# Patient Record
Sex: Female | Born: 1985
Health system: Southern US, Community
[De-identification: ages and names within clinical notes are randomized; demographics above are authoritative.]

## PROBLEM LIST (undated history)

## (undated) DIAGNOSIS — M542 Cervicalgia: Secondary | ICD-10-CM

## (undated) DIAGNOSIS — F419 Anxiety disorder, unspecified: Secondary | ICD-10-CM

## (undated) HISTORY — PX: SHOULDER FUSION SURGERY: SHX775

## (undated) HISTORY — DX: Cervicalgia: M54.2

---
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
## 2006-11-27 ENCOUNTER — Ambulatory Visit: Payer: Self-pay | Admitting: Gynecology

## 2006-11-27 ENCOUNTER — Inpatient Hospital Stay (HOSPITAL_COMMUNITY): Admission: AD | Admit: 2006-11-27 | Discharge: 2006-11-28 | Payer: Self-pay | Admitting: Gynecology

## 2009-01-15 ENCOUNTER — Emergency Department (HOSPITAL_COMMUNITY): Admission: EM | Admit: 2009-01-15 | Discharge: 2009-01-15 | Payer: Self-pay | Admitting: Emergency Medicine

## 2009-10-23 ENCOUNTER — Ambulatory Visit (HOSPITAL_COMMUNITY): Admission: RE | Admit: 2009-10-23 | Discharge: 2009-10-23 | Payer: Self-pay | Admitting: Obstetrics

## 2009-12-01 ENCOUNTER — Inpatient Hospital Stay (HOSPITAL_COMMUNITY): Admission: AD | Admit: 2009-12-01 | Discharge: 2009-12-01 | Payer: Self-pay | Admitting: Obstetrics

## 2010-02-21 ENCOUNTER — Ambulatory Visit: Payer: Self-pay | Admitting: Advanced Practice Midwife

## 2010-02-21 ENCOUNTER — Inpatient Hospital Stay (HOSPITAL_COMMUNITY): Admission: AD | Admit: 2010-02-21 | Discharge: 2010-02-21 | Payer: Self-pay | Admitting: Obstetrics

## 2010-03-04 ENCOUNTER — Inpatient Hospital Stay (HOSPITAL_COMMUNITY): Admission: AD | Admit: 2010-03-04 | Discharge: 2010-03-06 | Payer: Self-pay | Admitting: Obstetrics

## 2010-09-13 NOTE — L&D Delivery Note (Signed)
Delivery Note Pt received epidural and progressed rapidly to complete dilation. At 6:11 AM a healthy unspecified sex was delivered via Vaginal, Spontaneous Delivery (Presentation: ;  ).  APGAR: 8,9; weight pending  Placenta status: delivered spontaneously. Cord:  with the following complications: :  none Anesthesia: Epidural  Episiotomy: None Lacerations: None Suture Repair: n/a Est. Blood Loss (mL): 350  Mom to postpartum.  Baby to nursery-stable.  Lindsay Nicholson 09/12/2011, 6:23 AM

## 2010-11-29 LAB — CBC
HCT: 27.8 % — ABNORMAL LOW (ref 36.0–46.0)
HCT: 33.9 % — ABNORMAL LOW (ref 36.0–46.0)
Hemoglobin: 11.6 g/dL — ABNORMAL LOW (ref 12.0–15.0)
Hemoglobin: 9.8 g/dL — ABNORMAL LOW (ref 12.0–15.0)
MCH: 34.5 pg — ABNORMAL HIGH (ref 26.0–34.0)
MCHC: 34.2 g/dL (ref 30.0–36.0)
MCHC: 35.1 g/dL (ref 30.0–36.0)
MCV: 97.6 fL (ref 78.0–100.0)
MCV: 98.3 fL (ref 78.0–100.0)
Platelets: 156 10*3/uL (ref 150–400)
Platelets: 187 10*3/uL (ref 150–400)
RBC: 2.83 MIL/uL — ABNORMAL LOW (ref 3.87–5.11)
RBC: 3.47 MIL/uL — ABNORMAL LOW (ref 3.87–5.11)
RDW: 13.7 % (ref 11.5–15.5)
RDW: 13.9 % (ref 11.5–15.5)
WBC: 11.1 10*3/uL — ABNORMAL HIGH (ref 4.0–10.5)
WBC: 13.6 10*3/uL — ABNORMAL HIGH (ref 4.0–10.5)

## 2010-11-29 LAB — RPR: RPR Ser Ql: NONREACTIVE

## 2010-11-30 LAB — WET PREP, GENITAL
Trich, Wet Prep: NONE SEEN
Yeast Wet Prep HPF POC: NONE SEEN

## 2010-12-06 LAB — URINALYSIS, ROUTINE W REFLEX MICROSCOPIC
Bilirubin Urine: NEGATIVE
Ketones, ur: NEGATIVE mg/dL
Nitrite: NEGATIVE
pH: 6.5 (ref 5.0–8.0)

## 2011-04-11 ENCOUNTER — Inpatient Hospital Stay (HOSPITAL_COMMUNITY)
Admission: AD | Admit: 2011-04-11 | Discharge: 2011-04-11 | Disposition: A | Discharge status: Home / Self Care | Payer: Self-pay | Source: Ambulatory Visit | Attending: Obstetrics & Gynecology | Admitting: Obstetrics & Gynecology

## 2011-04-11 ENCOUNTER — Encounter (HOSPITAL_COMMUNITY): Payer: Self-pay | Admitting: *Deleted

## 2011-04-11 DIAGNOSIS — R109 Unspecified abdominal pain: Secondary | ICD-10-CM | POA: Insufficient documentation

## 2011-04-11 DIAGNOSIS — K219 Gastro-esophageal reflux disease without esophagitis: Secondary | ICD-10-CM

## 2011-04-11 DIAGNOSIS — Z3490 Encounter for supervision of normal pregnancy, unspecified, unspecified trimester: Secondary | ICD-10-CM

## 2011-04-11 DIAGNOSIS — O9989 Other specified diseases and conditions complicating pregnancy, childbirth and the puerperium: Secondary | ICD-10-CM | POA: Insufficient documentation

## 2011-04-11 DIAGNOSIS — R198 Other specified symptoms and signs involving the digestive system and abdomen: Secondary | ICD-10-CM | POA: Insufficient documentation

## 2011-04-11 DIAGNOSIS — O99330 Smoking (tobacco) complicating pregnancy, unspecified trimester: Secondary | ICD-10-CM | POA: Insufficient documentation

## 2011-04-11 HISTORY — DX: Anxiety disorder, unspecified: F41.9

## 2011-04-11 LAB — URINALYSIS, ROUTINE W REFLEX MICROSCOPIC
Bilirubin Urine: NEGATIVE
Hgb urine dipstick: NEGATIVE
Specific Gravity, Urine: >1.03 — ABNORMAL HIGH (ref 1.005–1.030)
Urobilinogen, UA: 0.2 mg/dL (ref 0.0–1.0)
pH: 6 (ref 5.0–8.0)

## 2011-04-11 LAB — WET PREP, GENITAL
Trich, Wet Prep: NONE SEEN
Yeast Wet Prep HPF POC: NONE SEEN

## 2011-04-11 MED ORDER — GI COCKTAIL ~~LOC~~
30.0000 mL | Freq: Once | ORAL | Status: AC
  Administered 2011-04-11: 30 mL via ORAL
  Filled 2011-04-11: qty 30

## 2011-04-11 MED ORDER — RANITIDINE HCL 150 MG PO TABS: 150.0000 mg | ORAL_TABLET | Freq: Two times a day (BID) | ORAL | Status: DC

## 2011-04-11 NOTE — ED Provider Notes (Signed)
History     Chief Complaint  Patient presents with  . Abdominal Pain  . Diarrhea   Patient is a 25 y.o. female presenting with abdominal pain and diarrhea. The history is provided by the patient.  Abdominal Pain The primary symptoms of the illness include abdominal pain and diarrhea. The current episode started more than 2 days ago.  Diarrhea The primary symptoms include abdominal pain and diarrhea.  Abdominal Pain The current episode started more than 2 days ago. Associated symptoms include diarrhea.  Diarrhea  Associated symptoms include abdominal pain.  States pain is intermittent. Some is epigastric, some are sharp pains on her sides bilaterally.  THey only last a few seconds. No bleeding. No PNC yet.     Past Medical History  Diagnosis Date  . Anxiety   . Anemia     Past Surgical History  Procedure Date  . Shoulder fusion surgery     Family History  Problem Relation Age of Onset  . Diabetes Father   . Hypertension Father     History  Substance Use Topics  . Smoking status: Current Everyday Smoker -- 0.2 packs/day  . Smokeless tobacco: Not on file  . Alcohol Use: Yes     none in the past week.    Allergies: No Known Allergies  Prescriptions prior to admission  Medication Sig Dispense Refill  . acetaminophen (TYLENOL) 325 MG tablet Take 1,000 mg by mouth daily as needed. For headache       . Ibuprofen (ADVIL) 200 MG CAPS Take 3 capsules by mouth daily as needed. For headache         Review of Systems  Gastrointestinal: Positive for abdominal pain and diarrhea.   Physical Exam   Blood pressure 102/63, pulse 84, temperature 98.7 F (37.1 C), temperature source Oral, resp. rate 16, height 5\' 1"  (1.549 m), weight 96 lb 6.4 oz (43.727 kg), last menstrual period 01/10/2011.  Physical Exam  Constitutional: She is oriented to person, place, and time. She appears well-developed and well-nourished.  HENT:  Head: Normocephalic.  Cardiovascular: Normal rate.     Respiratory: Effort normal.  GI: Soft. She exhibits no distension. There is no tenderness. There is no rebound and no guarding.  Genitourinary: Vagina normal and uterus normal. No vaginal discharge found.  Neurological: She is alert and oriented to person, place, and time.  Skin: Skin is warm and dry.    MAU Course  Procedures Results for orders placed during the hospital encounter of 04/11/11 (from the past 24 hour(s))  URINALYSIS, ROUTINE W REFLEX MICROSCOPIC     Status: Abnormal   Collection Time   04/11/11  5:50 PM      Component Value Range   Color, Urine YELLOW  YELLOW    Appearance CLEAR  CLEAR    Specific Gravity, Urine >1.030 (*) 1.005 - 1.030    pH 6.0  5.0 - 8.0    Glucose, UA NEGATIVE  NEGATIVE (mg/dL)   Hgb urine dipstick NEGATIVE  NEGATIVE    Bilirubin Urine NEGATIVE  NEGATIVE    Ketones, ur 15 (*) NEGATIVE (mg/dL)   Protein, ur NEGATIVE  NEGATIVE (mg/dL)   Urobilinogen, UA 0.2  0.0 - 1.0 (mg/dL)   Nitrite NEGATIVE  NEGATIVE    Leukocytes, UA NEGATIVE  NEGATIVE   POCT PREGNANCY, URINE     Status: Normal   Collection Time   04/11/11  5:56 PM      Component Value Range   Preg Test, Ur POSITIVE  WET PREP, GENITAL     Status: Abnormal   Collection Time   04/11/11  8:05 PM      Component Value Range   Yeast, Wet Prep NONE SEEN  NONE SEEN    Trich, Wet Prep NONE SEEN  NONE SEEN    Clue Cells, Wet Prep NONE SEEN  NONE SEEN    WBC, Wet Prep HPF POC FEW (*) NONE SEEN     Assessment and Plan  A:  IUP at 13 weeks by LMP Epigastric pain, r/o GERD Side pains, probably ligamentous  P:  WIll try one dose of GI cocktail  [facility] 04/11/2011, 7:55 PM   Pt reports significant improvement after GI cocktail.  D/C home with rx for ranitidine F/U as soon as possible for prenatal care

## 2011-04-11 NOTE — Progress Notes (Signed)
Pt c/o intermittant  upper abd pain and pain along both sides.

## 2011-04-11 NOTE — Progress Notes (Signed)
Pt reports having diarrehea on and off since Friday night. Having abd pain and cramping as well

## 2011-04-12 LAB — GC/CHLAMYDIA PROBE AMP, GENITAL: GC Probe Amp, Genital: NEGATIVE

## 2011-07-07 LAB — ANTIBODY SCREEN: Antibody Screen: NEGATIVE

## 2011-07-07 LAB — HIV ANTIBODY (ROUTINE TESTING W REFLEX): HIV: NONREACTIVE

## 2011-07-07 LAB — ABO/RH: RH Type: POSITIVE

## 2011-07-07 LAB — HEPATITIS B SURFACE ANTIGEN: Hepatitis B Surface Ag: NEGATIVE

## 2011-08-16 ENCOUNTER — Encounter (HOSPITAL_COMMUNITY): Payer: Self-pay | Admitting: *Deleted

## 2011-08-16 ENCOUNTER — Inpatient Hospital Stay (HOSPITAL_COMMUNITY)
Admission: AD | Admit: 2011-08-16 | Discharge: 2011-08-16 | Disposition: A | Discharge status: Home / Self Care | Payer: Medicaid Other | Source: Ambulatory Visit | Attending: Obstetrics and Gynecology | Admitting: Obstetrics and Gynecology

## 2011-08-16 DIAGNOSIS — Z3480 Encounter for supervision of other normal pregnancy, unspecified trimester: Secondary | ICD-10-CM

## 2011-08-16 DIAGNOSIS — O99891 Other specified diseases and conditions complicating pregnancy: Secondary | ICD-10-CM | POA: Insufficient documentation

## 2011-08-16 DIAGNOSIS — Z3490 Encounter for supervision of normal pregnancy, unspecified, unspecified trimester: Secondary | ICD-10-CM

## 2011-08-16 LAB — URINALYSIS, MICROSCOPIC ONLY
Bilirubin Urine: NEGATIVE
Hgb urine dipstick: NEGATIVE
Ketones, ur: NEGATIVE mg/dL
Protein, ur: NEGATIVE mg/dL
Urobilinogen, UA: 0.2 mg/dL (ref 0.0–1.0)

## 2011-08-16 NOTE — Progress Notes (Signed)
Pt G3 P2 at 31.1wks, leaking clear fluid since 0600.  Denies pain or bleeding.

## 2011-08-16 NOTE — ED Notes (Signed)
Bernita Buffy CNM notified of pt.  Will be in to see pt.

## 2011-08-16 NOTE — ED Provider Notes (Signed)
Lindsay Sam25 y.o.G3P2002 @[redacted]w[redacted]d  by [place] at [redacted]w[redacted]d Chief Complaint  Patient presents with  . Rupture of Membranes    SUBJECTIVE  HPI Had a gush of fluid at 0600 today which was enough to wet the bed. She has not continued to leak. She is aware of mild contractions. No vaginal bleeding. Good fetal movement. She denies having urinary incontinence and does not think it was urine. Last intercourse was about 2 weeks ago. Denies irritative vaginal discharge.  Past Medical History  Diagnosis Date  . Anxiety   . Anemia    Past Surgical History  Procedure Date  . Shoulder fusion surgery    History   Social History  . Marital Status: Single    Spouse Name: N/A    Number of Children: N/A  . Years of Education: N/A   Occupational History  . Not on file.   Social History Main Topics  . Smoking status: Current Everyday Smoker -- 0.2 packs/day  . Smokeless tobacco: Never Used  . Alcohol Use: No     none in the past week.  . Drug Use: No  . Sexually Active: Yes   Other Topics Concern  . Not on file   Social History Narrative  . No narrative on file   No current facility-administered medications on file prior to encounter.   Current Outpatient Prescriptions on File Prior to Encounter  Medication Sig Dispense Refill  . acetaminophen (TYLENOL) 325 MG tablet Take 650 mg by mouth daily as needed. For headache       No Known Allergies  ROS: Pertinent items in HPI  OBJECTIVE  BP 104/69  Pulse 75  Temp(Src) 97.2 F (36.2 C) (Oral)  Resp 18  Ht 5\' 2"  (1.575 m)  Wt 51.891 kg (114 lb 6.4 oz)  BMI 20.92 kg/m2  LMP 01/10/2011   Physical Exam  Constitutional: She is well-developed, well-nourished, and in no distress.  HENT:  Head: Normocephalic.  Neck: Neck supple.  Cardiovascular: Normal rate.   Pulmonary/Chest: Effort normal.  Abdominal: Soft. There is no tenderness.  Genitourinary: Vagina normal and cervix normal.       SSE: neg pool, mod thin white discharge, fern  neg Cx visually closed   Toco: UCs q 10-12 min x 30-40 sec FHR: 140, reactive without decelerations  MAU Course: care turned over to [facility] CNM. Urinalysis and any sure are pending.  Results for orders placed during the hospital encounter of 08/16/11 (from the past 24 hour(s))  AMNISURE RUPTURE OF MEMBRANE (ROM)     Status: Normal   Collection Time   08/16/11  7:54 AM      Component Value Range   Amnisure ROM NEGATIVE    URINALYSIS, MICROSCOPIC ONLY     Status: Abnormal   Collection Time   08/16/11  7:55 AM      Component Value Range   Color, Urine YELLOW  YELLOW    APPearance HAZY (*) CLEAR    Specific Gravity, Urine 1.025  1.005 - 1.030    pH 6.5  5.0 - 8.0    Glucose, UA NEGATIVE  NEGATIVE (mg/dL)   Hgb urine dipstick NEGATIVE  NEGATIVE    Bilirubin Urine NEGATIVE  NEGATIVE    Ketones, ur NEGATIVE  NEGATIVE (mg/dL)   Protein, ur NEGATIVE  NEGATIVE (mg/dL)   Urobilinogen, UA 0.2  0.0 - 1.0 (mg/dL)   Nitrite NEGATIVE  NEGATIVE    Leukocytes, UA SMALL (*) NEGATIVE    WBC, UA 7-10  <3 (WBC/hpf)  RBC / HPF 0-2  <3 (RBC/hpf)   Squamous Epithelial / LPF MANY (*) RARE      ASSESSMENT 25 y.o. [id number] at [redacted]w[redacted]d Membranes intact, no signs of labor  PLAN Consulted with Dr. Ambrose Mantle, ok to d/c home with precuations, f/u as scheduled

## 2011-08-31 LAB — STREP B DNA PROBE: GBS: NEGATIVE

## 2011-09-12 ENCOUNTER — Inpatient Hospital Stay (HOSPITAL_COMMUNITY): Payer: Medicaid Other | Admitting: Anesthesiology

## 2011-09-12 ENCOUNTER — Encounter (HOSPITAL_COMMUNITY): Payer: Self-pay | Admitting: Anesthesiology

## 2011-09-12 ENCOUNTER — Encounter (HOSPITAL_COMMUNITY): Payer: Self-pay | Admitting: *Deleted

## 2011-09-12 ENCOUNTER — Inpatient Hospital Stay (HOSPITAL_COMMUNITY)
Admission: AD | Admit: 2011-09-12 | Discharge: 2011-09-14 | DRG: 775 | Disposition: A | Discharge status: Home / Self Care | Payer: Medicaid Other | Source: Ambulatory Visit | Attending: Obstetrics and Gynecology | Admitting: Obstetrics and Gynecology

## 2011-09-12 LAB — CBC
MCV: 95.1 fL (ref 78.0–100.0)
Platelets: 196 10*3/uL (ref 150–400)
RBC: 3.85 MIL/uL — ABNORMAL LOW (ref 3.87–5.11)
WBC: 8.3 10*3/uL (ref 4.0–10.5)

## 2011-09-12 LAB — RPR: RPR Ser Ql: NONREACTIVE

## 2011-09-12 MED ORDER — ACETAMINOPHEN 325 MG PO TABS: 650.0000 mg | ORAL_TABLET | ORAL | Status: DC | PRN

## 2011-09-12 MED ORDER — LACTATED RINGERS IV SOLN: INTRAVENOUS | Status: DC

## 2011-09-12 MED ORDER — EPHEDRINE 5 MG/ML INJ: 10.0000 mg | INTRAVENOUS | Status: DC | PRN

## 2011-09-12 MED ORDER — OXYTOCIN 20 UNITS IN LACTATED RINGERS INFUSION - SIMPLE: 125.0000 mL/h | Freq: Once | INTRAVENOUS | Status: DC

## 2011-09-12 MED ORDER — TETANUS-DIPHTH-ACELL PERTUSSIS 5-2.5-18.5 LF-MCG/0.5 IM SUSP
0.5000 mL | Freq: Once | INTRAMUSCULAR | Status: AC
  Administered 2011-09-13: 0.5 mL via INTRAMUSCULAR
  Filled 2011-09-12: qty 0.5

## 2011-09-12 MED ORDER — DIPHENHYDRAMINE HCL 25 MG PO CAPS: 25.0000 mg | ORAL_CAPSULE | Freq: Four times a day (QID) | ORAL | Status: DC | PRN

## 2011-09-12 MED ORDER — DIPHENHYDRAMINE HCL 50 MG/ML IJ SOLN: 12.5000 mg | INTRAMUSCULAR | Status: DC | PRN

## 2011-09-12 MED ORDER — ONDANSETRON HCL 4 MG PO TABS: 4.0000 mg | ORAL_TABLET | ORAL | Status: DC | PRN

## 2011-09-12 MED ORDER — OXYCODONE-ACETAMINOPHEN 5-325 MG PO TABS: 2.0000 | ORAL_TABLET | ORAL | Status: DC | PRN

## 2011-09-12 MED ORDER — ONDANSETRON HCL 4 MG/2ML IJ SOLN: 4.0000 mg | INTRAMUSCULAR | Status: DC | PRN

## 2011-09-12 MED ORDER — LACTATED RINGERS IV SOLN: 500.0000 mL | INTRAVENOUS | Status: DC | PRN

## 2011-09-12 MED ORDER — WITCH HAZEL-GLYCERIN EX PADS: 1.0000 "application " | MEDICATED_PAD | CUTANEOUS | Status: DC | PRN

## 2011-09-12 MED ORDER — ZOLPIDEM TARTRATE 5 MG PO TABS: 5.0000 mg | ORAL_TABLET | Freq: Every evening | ORAL | Status: DC | PRN

## 2011-09-12 MED ORDER — ONDANSETRON HCL 4 MG/2ML IJ SOLN: 4.0000 mg | Freq: Four times a day (QID) | INTRAMUSCULAR | Status: DC | PRN

## 2011-09-12 MED ORDER — LANOLIN HYDROUS EX OINT: TOPICAL_OINTMENT | CUTANEOUS | Status: DC | PRN

## 2011-09-12 MED ORDER — PHENYLEPHRINE 40 MCG/ML (10ML) SYRINGE FOR IV PUSH (FOR BLOOD PRESSURE SUPPORT)
80.0000 ug | PREFILLED_SYRINGE | INTRAVENOUS | Status: DC | PRN
  Filled 2011-09-12: qty 5

## 2011-09-12 MED ORDER — CITRIC ACID-SODIUM CITRATE 334-500 MG/5ML PO SOLN: 30.0000 mL | ORAL | Status: DC | PRN

## 2011-09-12 MED ORDER — LIDOCAINE HCL (PF) 1 % IJ SOLN
30.0000 mL | INTRAMUSCULAR | Status: DC | PRN
  Filled 2011-09-12: qty 30

## 2011-09-12 MED ORDER — SODIUM BICARBONATE 8.4 % IV SOLN
INTRAVENOUS | Status: DC | PRN
  Administered 2011-09-12: 4 mL via EPIDURAL

## 2011-09-12 MED ORDER — BENZOCAINE-MENTHOL 20-0.5 % EX AERO: 1.0000 "application " | INHALATION_SPRAY | CUTANEOUS | Status: DC | PRN

## 2011-09-12 MED ORDER — SENNOSIDES-DOCUSATE SODIUM 8.6-50 MG PO TABS
2.0000 | ORAL_TABLET | Freq: Every day | ORAL | Status: DC
  Administered 2011-09-12 – 2011-09-13 (×2): 2 via ORAL

## 2011-09-12 MED ORDER — EPHEDRINE 5 MG/ML INJ
10.0000 mg | INTRAVENOUS | Status: DC | PRN
  Filled 2011-09-12: qty 4

## 2011-09-12 MED ORDER — OXYCODONE-ACETAMINOPHEN 5-325 MG PO TABS
1.0000 | ORAL_TABLET | ORAL | Status: DC | PRN
  Administered 2011-09-12: 0.5 via ORAL
  Administered 2011-09-13 (×2): 1 via ORAL
  Filled 2011-09-12 (×3): qty 1

## 2011-09-12 MED ORDER — BUTORPHANOL TARTRATE 2 MG/ML IJ SOLN: 1.0000 mg | INTRAMUSCULAR | Status: DC | PRN

## 2011-09-12 MED ORDER — IBUPROFEN 600 MG PO TABS: 600.0000 mg | ORAL_TABLET | Freq: Four times a day (QID) | ORAL | Status: DC | PRN

## 2011-09-12 MED ORDER — IBUPROFEN 600 MG PO TABS
600.0000 mg | ORAL_TABLET | Freq: Four times a day (QID) | ORAL | Status: DC
  Administered 2011-09-12 – 2011-09-14 (×9): 600 mg via ORAL
  Filled 2011-09-12 (×5): qty 1
  Filled 2011-09-12: qty 2
  Filled 2011-09-12 (×2): qty 1

## 2011-09-12 MED ORDER — PHENYLEPHRINE 40 MCG/ML (10ML) SYRINGE FOR IV PUSH (FOR BLOOD PRESSURE SUPPORT): 80.0000 ug | PREFILLED_SYRINGE | INTRAVENOUS | Status: DC | PRN

## 2011-09-12 MED ORDER — FENTANYL 2.5 MCG/ML BUPIVACAINE 1/10 % EPIDURAL INFUSION (WH - ANES)
INTRAMUSCULAR | Status: DC | PRN
  Administered 2011-09-12: 12 mL/h via EPIDURAL

## 2011-09-12 MED ORDER — SIMETHICONE 80 MG PO CHEW: 80.0000 mg | CHEWABLE_TABLET | ORAL | Status: DC | PRN

## 2011-09-12 MED ORDER — DIBUCAINE 1 % RE OINT: 1.0000 "application " | TOPICAL_OINTMENT | RECTAL | Status: DC | PRN

## 2011-09-12 MED ORDER — PRENATAL MULTIVITAMIN CH
1.0000 | ORAL_TABLET | Freq: Every day | ORAL | Status: DC
  Administered 2011-09-12 – 2011-09-14 (×3): 1 via ORAL
  Filled 2011-09-12 (×3): qty 1

## 2011-09-12 MED ORDER — OXYTOCIN BOLUS FROM INFUSION
500.0000 mL | Freq: Once | INTRAVENOUS | Status: DC
  Filled 2011-09-12: qty 1000
  Filled 2011-09-12: qty 500

## 2011-09-12 MED ORDER — FLEET ENEMA 7-19 GM/118ML RE ENEM: 1.0000 | ENEMA | RECTAL | Status: DC | PRN

## 2011-09-12 MED ORDER — FENTANYL 2.5 MCG/ML BUPIVACAINE 1/10 % EPIDURAL INFUSION (WH - ANES)
14.0000 mL/h | INTRAMUSCULAR | Status: DC
  Filled 2011-09-12: qty 60

## 2011-09-12 MED ORDER — LACTATED RINGERS IV SOLN
500.0000 mL | Freq: Once | INTRAVENOUS | Status: AC
  Administered 2011-09-12: 500 mL via INTRAVENOUS

## 2011-09-12 NOTE — Progress Notes (Signed)
G3P2 at 37.0wks. Ctxs since 0300. Denies bleeding or leaking fld

## 2011-09-12 NOTE — H&P (Signed)
Lindsay Nicholson is a 25 y.o. female G3P2002 at 37+weeks (EDD 10/03/11 by 23 week [place]) presenting for active labor with cervical change to 8cm.  Admitted and now receiving epidural. Prenatal care uneventful except late start at 23 weeks.  Maternal Medical History:  Reason for admission: Reason for admission: contractions.  Contractions: Onset was 3-5 hours ago.   Frequency: regular.   Perceived severity is strong.      OB History    Grav Para Term Preterm Abortions TAB SAB Ect Mult Living   3 2 2       2     2008 NSVD 6# 2011 NSVD 6#2oz  Past Medical History  Diagnosis Date  . Anxiety   . Anemia    Past Surgical History  Procedure Date  . Shoulder fusion surgery    Family History: family history includes Diabetes in her father and Hypertension in her father.  There is no history of Anesthesia problems. Social History:  reports that she has been smoking.  She has never used smokeless tobacco. She reports that she does not drink alcohol or use illicit drugs.  ROS  Dilation: 7.5 (7-8) Effacement (%): 90 Station: -1 Exam by:: Peace, rn and Rolitta, rn Blood pressure 131/103, pulse 81, temperature 97.8 F (36.6 C), temperature source Oral, resp. rate 18, height 5\' 2"  (1.575 m), weight 52.617 kg (116 lb), last menstrual period 01/10/2011. Maternal Exam:  Uterine Assessment: Contraction strength is moderate.  Contraction frequency is regular.   Abdomen: Patient reports no abdominal tenderness. Fetal presentation: vertex  Introitus: Normal vulva. Normal vagina.    Physical Exam  Constitutional: She is oriented to person, place, and time. She appears well-developed and well-nourished.  Cardiovascular: Normal rate and regular rhythm.   Respiratory: Effort normal and breath sounds normal.  GI: Soft. Bowel sounds are normal.  Neurological: She is alert and oriented to person, place, and time.  Psychiatric: She has a normal mood and affect. Her behavior is normal.    Prenatal  labs: ABO, Rh:  O positive Antibody:  negative Rubella:  Immune RPR:   NR HBsAg:   Neg HIV:   NR GBS:   Neg One hour glucola 83 Genetic screens not done b/c too late at presentation  Assessment/Plan: Pt presented in active labor, now receiving her epidural.     Huel Cote W 09/12/2011, 5:38 AM

## 2011-09-12 NOTE — Anesthesia Preprocedure Evaluation (Signed)

## 2011-09-12 NOTE — Anesthesia Postprocedure Evaluation (Signed)
  Anesthesia Post-op Note  Patient: Lindsay Nicholson  Procedure(s) Performed: * No procedures listed *  Patient Location: PACU and Mother/Baby  Anesthesia Type: Epidural  Level of Consciousness: awake, alert  and oriented  Airway and Oxygen Therapy: Patient Spontanous Breathing   Post-op Assessment: Patient's Cardiovascular Status Stable and Respiratory Function Stable  Post-op Vital Signs: stable  Complications: No apparent anesthesia complications

## 2011-09-12 NOTE — Anesthesia Procedure Notes (Signed)

## 2011-09-13 LAB — CBC
Hemoglobin: 11.2 g/dL — ABNORMAL LOW (ref 12.0–15.0)
MCH: 32.4 pg (ref 26.0–34.0)
MCHC: 33.8 g/dL (ref 30.0–36.0)
RDW: 13.6 % (ref 11.5–15.5)

## 2011-09-13 MED ORDER — OXYCODONE-ACETAMINOPHEN 5-325 MG PO TABS: 1.0000 | ORAL_TABLET | ORAL | Status: AC | PRN

## 2011-09-13 MED ORDER — IBUPROFEN 600 MG PO TABS: 600.0000 mg | ORAL_TABLET | Freq: Four times a day (QID) | ORAL | Status: AC

## 2011-09-13 NOTE — Discharge Summary (Addendum)
Obstetric Discharge Summary Reason for Admission: onset of labor Prenatal Procedures: none Intrapartum Procedures: spontaneous vaginal delivery Postpartum Procedures: none Complications-Operative and Postpartum: none Hemoglobin  Date Value Range Status  09/13/2011 11.2* 12.0-15.0 (g/dL) Final     HCT  Date Value Range Status  09/13/2011 33.1* 36.0-46.0 (%) Final    Discharge Diagnoses: Term Pregnancy-delivered  Discharge Information: Date: 09/13/2011 Activity: pelvic rest Diet: routine Medications: Ibuprofen and Percocet Condition: stable Instructions: refer to practice specific booklet Discharge to: home Follow-up Information    Follow up with RICHARDSON,KATHY W. Make an appointment in 6 weeks.   Contact information:   [address] [place] [address] [phone]          Newborn Data: Live born female  Birth Weight: 5 lb 2.4 oz (2335 g) APGAR: 9, 9  Home with mother.  Lindsay Nicholson 09/13/2011, 7:18 AM  Pt not discharged 09/13/11, due to baby being unable to be discharged,  D/c home 09/14/11

## 2011-09-13 NOTE — Progress Notes (Signed)
SW received referral for [facility] and history of Anxiety.  SW notes that prenatal record states care began at 27 weeks, which does not meet our [facility] policy guidelines of 28 weeks or later.  SW does not see Anxiety documented anywhere in patient's record.  SW called bedside RN/Ellen who also does not know where the referral for Anxiety came from and states patient has not appeared anxious at this point.  SW has screened out referral but will see patient by her request or if needs arise. 

## 2011-09-13 NOTE — Progress Notes (Signed)
Post Partum Day 1 Subjective: no complaints, voiding and tolerating PO, requests early d/c  Objective: Blood pressure 104/65, pulse 75, temperature 98.1 F (36.7 C), temperature source Oral, resp. rate 18, height 5\' 2"  (1.575 m), weight 52.617 kg (116 lb), last menstrual period 01/10/2011, SpO2 100.00%, unknown if currently breastfeeding.  Physical Exam:  General: alert Lochia: appropriate Uterine Fundus: firm    Basename 09/12/11 0510  HGB 12.5  HCT 36.6    Assessment/Plan: D/c home if baby able to go Motrin, percocet F/u 6 weeks   LOS: 1 day   Tamari Busic W 09/13/2011, 3:23 AM

## 2011-09-13 NOTE — Progress Notes (Signed)
UR chart review completed.  

## 2011-09-14 ENCOUNTER — Encounter (HOSPITAL_COMMUNITY): Payer: Self-pay | Admitting: Obstetrics and Gynecology

## 2011-09-14 NOTE — Progress Notes (Signed)
Post Partum Day 2 Subjective: no complaints, up ad lib, voiding and tolerating PO  Objective: Blood pressure 112/68, pulse 80, temperature 98.6 F (37 C), temperature source Oral, resp. rate 18, height 5\' 2"  (1.575 m), weight 52.617 kg (116 lb), last menstrual period 01/10/2011, SpO2 100.00%, unknown if currently breastfeeding.  Physical Exam:  General: alert and no distress Lochia: appropriate Uterine Fundus: firm   Basename 09/13/11 0530 09/12/11 0510  HGB 11.2* 12.5  HCT 33.1* 36.6    Assessment/Plan: Discharge home   LOS: 2 days   BOVARD,Tillmon Kisling 09/14/2011, 10:32 AM

## 2014-07-15 ENCOUNTER — Encounter (HOSPITAL_COMMUNITY): Payer: Self-pay | Admitting: Obstetrics and Gynecology

## 2014-12-03 ENCOUNTER — Ambulatory Visit (INDEPENDENT_AMBULATORY_CARE_PROVIDER_SITE_OTHER): Payer: Commercial Managed Care - PPO | Admitting: Internal Medicine

## 2014-12-03 ENCOUNTER — Encounter: Payer: Self-pay | Admitting: Internal Medicine

## 2014-12-03 VITALS — BP 102/64 | HR 93 | Temp 98.5°F | Ht 61.5 in | Wt 109.0 lb

## 2014-12-03 DIAGNOSIS — Z0000 Encounter for general adult medical examination without abnormal findings: Secondary | ICD-10-CM

## 2014-12-03 DIAGNOSIS — R51 Headache: Secondary | ICD-10-CM

## 2014-12-03 DIAGNOSIS — F411 Generalized anxiety disorder: Secondary | ICD-10-CM

## 2014-12-03 DIAGNOSIS — R519 Headache, unspecified: Secondary | ICD-10-CM | POA: Insufficient documentation

## 2014-12-03 LAB — COMPREHENSIVE METABOLIC PANEL
ALBUMIN: 4.7 g/dL (ref 3.5–5.2)
ALT: 12 U/L (ref 0–35)
AST: 17 U/L (ref 0–37)
Alkaline Phosphatase: 46 U/L (ref 39–117)
BUN: 15 mg/dL (ref 6–23)
CHLORIDE: 107 meq/L (ref 96–112)
CO2: 27 meq/L (ref 19–32)
Calcium: 9.6 mg/dL (ref 8.4–10.5)
Creatinine, Ser: 0.85 mg/dL (ref 0.40–1.20)
GFR: 83.78 mL/min (ref >60.00)
GLUCOSE: 75 mg/dL (ref 70–99)
Potassium: 3.7 mEq/L (ref 3.5–5.1)
SODIUM: 140 meq/L (ref 135–145)
Total Bilirubin: 0.4 mg/dL (ref 0.2–1.2)
Total Protein: 7.5 g/dL (ref 6.0–8.3)

## 2014-12-03 LAB — TSH: TSH: 1.02 u[IU]/mL (ref 0.35–4.50)

## 2014-12-03 LAB — LIPID PANEL
CHOL/HDL RATIO: 3
Cholesterol: 137 mg/dL (ref 0–200)
HDL: 44 mg/dL (ref >39.00)
LDL CALC: 65 mg/dL (ref 0–99)
NonHDL: 93
TRIGLYCERIDES: 140 mg/dL (ref 0.0–149.0)
VLDL: 28 mg/dL (ref 0.0–40.0)

## 2014-12-03 LAB — CBC
HEMATOCRIT: 36.3 % (ref 36.0–46.0)
Hemoglobin: 12.1 g/dL (ref 12.0–15.0)
MCHC: 33.3 g/dL (ref 30.0–36.0)
MCV: 91.4 fl (ref 78.0–100.0)
Platelets: 258 10*3/uL (ref 150.0–400.0)
RBC: 3.97 Mil/uL (ref 3.87–5.11)
RDW: 13 % (ref 11.5–15.5)
WBC: 4.6 10*3/uL (ref 4.0–10.5)

## 2014-12-03 MED ORDER — ETONOGESTREL 68 MG ~~LOC~~ IMPL: 1.0000 | DRUG_IMPLANT | Freq: Once | SUBCUTANEOUS | Status: DC

## 2014-12-03 MED ORDER — BUTALBITAL-APAP-CAFFEINE 50-325-40 MG PO TABS: 1.0000 | ORAL_TABLET | Freq: Four times a day (QID) | ORAL | Status: DC | PRN

## 2014-12-03 NOTE — Progress Notes (Signed)
Pre visit review using our clinic review tool, if applicable. No additional management support is needed unless otherwise documented below in the visit note. 

## 2014-12-03 NOTE — Assessment & Plan Note (Signed)
Situational, not occuring at this moment Will monitor for now

## 2014-12-03 NOTE — Progress Notes (Signed)
Subjective:    Patient ID: Lindsay Nicholson, female    DOB: 07/04/1986, 29 y.o.   MRN: [id number]  HPI  Pt presents to the clinic today to establish care and for management of the conditions listed below. She has not had a PCP in many years. She would like her physical exam today.  Frequent Headaches: She reports these are occuring on a weekly basis. This started 1 year ago. They usually last 2-3 days. The pain starts in the back of her head and radiates to the neck and shoulders, mostly on the left side. She describes the pain as sharp. She has had some associated nausea, photophobia, and dizziness. She has been taking Ibuprofen with minimal relief. She does not feel stressed out. She denies any injury to the head or neck.  Anxiety: This is usually situational. She has been given Valium in the past but does not like the way it made her feel. She reports she is not currently having anxiety and is not interested in treatment at this time.  Flu: 09/2013 Tetanus: 2012 LMP: 12/03/2014, has Implanon, periods irregular, occur every 2-3 months. Pap Smear: 10/2014 Dentist: as needed  Review of Systems      Past Medical History  Diagnosis Date  . Anxiety   . Anemia   . SVD (spontaneous vaginal delivery) 09/14/2011    Current Outpatient Prescriptions  Medication Sig Dispense Refill  . ibuprofen (ADVIL,MOTRIN) 200 MG tablet Take 200 mg by mouth as needed.    Marland Kitchen. levonorgestrel (MIRENA) 20 MCG/24HR IUD 1 each by Intrauterine route once. Placed 10/2014     No current facility-administered medications for this visit.    No Known Allergies  Family History  Problem Relation Age of Onset  . Diabetes Father   . Hypertension Father   . Anesthesia problems Neg Hx     History   Social History  . Marital Status: Single    Spouse Name: N/A  . Number of Children: N/A  . Years of Education: N/A   Occupational History  . Not on file.   Social History Main Topics  . Smoking status: Current Every  Day Smoker -- 0.25 packs/day  . Smokeless tobacco: Never Used  . Alcohol Use: 0.0 oz/week    0 Standard drinks or equivalent per week     Comment: occasional  . Drug Use: No  . Sexual Activity: Yes   Other Topics Concern  . Not on file   Social History Narrative     Constitutional: Pt reports headache. Denies fever, malaise, fatigue, or abrupt weight changes.  HEENT: Denies eye pain, eye redness, ear pain, ringing in the ears, wax buildup, runny nose, nasal congestion, bloody nose, or sore throat. Respiratory: Denies difficulty breathing, shortness of breath, cough or sputum production.   Cardiovascular: Denies chest pain, chest tightness, palpitations or swelling in the hands or feet.  Gastrointestinal: Denies abdominal pain, bloating, constipation, diarrhea or blood in the stool.  GU: Denies urgency, frequency, pain with urination, burning sensation, blood in urine, odor or discharge. Musculoskeletal: Denies decrease in range of motion, difficulty with gait, muscle pain or joint pain and swelling.  Skin: Denies redness, rashes, lesions or ulcercations.  Neurological: Denies dizziness, difficulty with memory, difficulty with speech or problems with balance and coordination.  Psych: Denies anxiety, depression, SI/HI.  No other specific complaints in a complete review of systems (except as listed in HPI above).  Objective:   Physical Exam  BP 102/64 mmHg  Pulse 93  Temp(Src) 98.5 F (36.9 C) (Oral)  Ht 5' 1.5" (1.562 m)  Wt 109 lb (49.442 kg)  BMI 20.26 kg/m2  SpO2 99% Wt Readings from Last 3 Encounters:  12/03/14 109 lb (49.442 kg)  09/12/11 116 lb (52.617 kg)  08/16/11 114 lb 6.4 oz (51.891 kg)    General: Appears her stated age, well developed, well nourished in NAD. Skin: Warm, dry and intact. No rashes, lesions or ulcerations noted. HEENT: Head: normal shape and size; Eyes: sclera white, no icterus, conjunctiva pink, PERRLA and EOMs intact; Ears: Tm's gray and  intact, normal light reflex; Nose: mucosa pink and moist, septum midline; Throat/Mouth: Teeth present, mucosa pink and moist, no exudate, lesions or ulcerations noted.  Neck: Neck supple, trachea midline. No masses, lumps or thyromegaly present.  Cardiovascular: Normal rate and rhythm. S1,S2 noted.  No murmur, rubs or gallops noted.  Pulmonary/Chest: Normal effort and positive vesicular breath sounds. No respiratory distress. No wheezes, rales or ronchi noted.  Abdomen: Soft and nontender. Normal bowel sounds, no bruits noted. No distention or masses noted. Liver, spleen and kidneys non palpable. Musculoskeletal: Normal flexion, extension and rotation of the neck. No pain with palpation of the cervical spine or paraspinal muscles. Neurological: Alert and oriented. Cranial nerves II-XII grossly intact. Coordination normal.  Psychiatric: Mood and affect normal. Behavior is normal. Judgment and thought content normal.      Assessment & Plan:   Preventative Health Maintenance:  Encouraged her to visit a dentist at least annually All other HM UTD Will check CBC, CMET, TSH and Lipid Profile today  RTC in 1 year or sooner if needed

## 2014-12-03 NOTE — Assessment & Plan Note (Signed)
?   Migraine versus tension She will keep a headache log- will review at your next visit Advised her to continue Ibuprofen, if ineffective, take the Fioricet RX given for Fioricet

## 2014-12-03 NOTE — Patient Instructions (Signed)

## 2014-12-04 ENCOUNTER — Telehealth: Payer: Self-pay | Admitting: Internal Medicine

## 2014-12-04 NOTE — Telephone Encounter (Signed)
emmi emailed °

## 2014-12-10 ENCOUNTER — Ambulatory Visit: Payer: Self-pay | Admitting: Family Medicine

## 2014-12-11 ENCOUNTER — Encounter: Payer: Self-pay | Admitting: Family Medicine

## 2014-12-11 ENCOUNTER — Ambulatory Visit (INDEPENDENT_AMBULATORY_CARE_PROVIDER_SITE_OTHER): Payer: Commercial Managed Care - PPO | Admitting: Family Medicine

## 2014-12-11 VITALS — BP 112/67 | HR 112 | Temp 99.1°F | Ht 61.5 in | Wt 106.2 lb

## 2014-12-11 DIAGNOSIS — A084 Viral intestinal infection, unspecified: Secondary | ICD-10-CM | POA: Diagnosis not present

## 2014-12-11 MED ORDER — ONDANSETRON HCL 4 MG PO TABS: 4.0000 mg | ORAL_TABLET | Freq: Three times a day (TID) | ORAL | Status: DC | PRN

## 2014-12-11 NOTE — Progress Notes (Signed)
   Dr. Karleen HampshireSpencer T. Devorah Givhan, MD, CAQ Sports [facility] [address] [place], [id number] Phone: [phone] Fax: [phone]  12/11/2014  Patient: Lindsay Nicholson, MRN: [id number], DOB: 09/19/1985, 29 y.o.  Primary Physician:  Nicki ReaperBAITY, REGINA, NP  Chief Complaint: Emesis and Diarrhea  Subjective:   Lindsay Nicholson is a 29 y.o. very pleasant female patient who presents with the following:  Achy, throwing up, two times.  Yesterday did not feel good.  She also had 2 episodes of diarrhea. She generally does not feel well and is achy. She has not had any significant fever.  Past Medical History, Surgical History, Social History, Family History, Problem List, Medications, and Allergies have been reviewed and updated if relevant.  ROS: GEN: Acute illness details above GI: Tolerating PO intake GU: maintaining adequate hydration and urination Pulm: No SOB Interactive and getting along well at home.  Otherwise, ROS is as per the HPI.   Objective:   BP 112/67 mmHg  Pulse 112  Temp(Src) 99.1 F (37.3 C) (Oral)  Ht 5' 1.5" (1.562 m)  Wt 106 lb 4 oz (48.195 kg)  BMI 19.75 kg/m2  GEN: WDWN, NAD, Non-toxic, A & O x 3 HEENT: Atraumatic, Normocephalic. Neck supple. No masses, No LAD. Ears and Nose: No external deformity. CV: RRR, No M/G/R. No JVD. No thrill. No extra heart sounds. PULM: CTA B, no wheezes, crackles, rhonchi. No retractions. No resp. distress. No accessory muscle use. ABD: S, NT, ND, hyperactive BS. No rebound. No HSM. EXTR: No c/c/e NEURO Normal gait.  PSYCH: Normally interactive. Conversant. Not depressed or anxious appearing.  Calm demeanor.     Laboratory and Imaging Data:  Assessment and Plan:   Viral gastroenteritis  Supportive care  Follow-up: No Follow-up on file.  New Prescriptions   ONDANSETRON (ZOFRAN) 4 MG TABLET    Take 1 tablet (4 mg total) by mouth every 8 (eight) hours as needed for nausea or vomiting.   No orders of  the defined types were placed in this encounter.    Signed,  Elpidio GaleaSpencer T. Ladislaus Repsher, MD   Patient's Medications  New Prescriptions   ONDANSETRON (ZOFRAN) 4 MG TABLET    Take 1 tablet (4 mg total) by mouth every 8 (eight) hours as needed for nausea or vomiting.  Previous Medications   BUTALBITAL-ACETAMINOPHEN-CAFFEINE (FIORICET, ESGIC) 50-325-40 MG PER TABLET    Take 1 tablet by mouth every 6 (six) hours as needed for headache.   ETONOGESTREL (NEXPLANON) 68 MG IMPL IMPLANT    1 each (68 mg total) by Subdermal route once.   IBUPROFEN (ADVIL,MOTRIN) 200 MG TABLET    Take 200 mg by mouth as needed.  Modified Medications   No medications on file  Discontinued Medications   No medications on file

## 2014-12-11 NOTE — Progress Notes (Signed)
Pre visit review using our clinic review tool, if applicable. No additional management support is needed unless otherwise documented below in the visit note. 

## 2014-12-20 ENCOUNTER — Ambulatory Visit: Payer: Medicaid Other | Admitting: Internal Medicine

## 2015-03-18 ENCOUNTER — Other Ambulatory Visit: Payer: Self-pay | Admitting: Internal Medicine

## 2015-03-19 NOTE — Telephone Encounter (Signed)
Ok to phone in Lindsay Nicholson

## 2015-03-19 NOTE — Telephone Encounter (Signed)
Rx called in to pharmacy. 

## 2015-03-19 NOTE — Telephone Encounter (Signed)
Last filled 12/03/14--please advise

## 2015-04-07 ENCOUNTER — Encounter: Payer: Self-pay | Admitting: Internal Medicine

## 2015-04-07 ENCOUNTER — Ambulatory Visit (INDEPENDENT_AMBULATORY_CARE_PROVIDER_SITE_OTHER): Payer: Commercial Managed Care - PPO | Admitting: Internal Medicine

## 2015-04-07 VITALS — BP 105/62 | HR 107 | Temp 98.4°F | Wt 108.0 lb

## 2015-04-07 DIAGNOSIS — M25512 Pain in left shoulder: Secondary | ICD-10-CM

## 2015-04-07 DIAGNOSIS — R51 Headache: Secondary | ICD-10-CM

## 2015-04-07 DIAGNOSIS — R519 Headache, unspecified: Secondary | ICD-10-CM

## 2015-04-07 MED ORDER — SUMATRIPTAN SUCCINATE 25 MG PO TABS: 25.0000 mg | ORAL_TABLET | ORAL | Status: DC | PRN

## 2015-04-07 MED ORDER — MELOXICAM 15 MG PO TABS
15.0000 mg | ORAL_TABLET | Freq: Every day | ORAL | Status: DC
Start: 2015-04-07 — End: 2015-10-02

## 2015-04-07 NOTE — Patient Instructions (Signed)

## 2015-04-07 NOTE — Progress Notes (Signed)
Subjective:    Patient ID: Lindsay Nicholson, female    DOB: March 05, 1986, 29 y.o.   MRN: [id number]  HPI  Pt presents to the clinic today to follow up migraines. She has been having migraines daily x 2-3 weeks. The headaches starts in the back of her head at the left base of her skull. The pain radiates down into her neck. She describes the pain as tightness. She has had some associated dizziness and sensitivity to light. She denies sensitivity to sound, nausea and vomiting. She is not sure what triggers it. She denies changes in diet, medication or increased stress. Fioricet and Ibuprofen are not helping. Prior to 2-3 weeks ago, she was only get headaches every few months.  Additionally, she c/o left shoulder pain. She reports she has had a rotator cuff repair/fusion in 2006. Her should hurts her everyday. It seems worse at night when she tries to lay down. She has not had a recurrent injury. She has tried taking Aleve and Ibuprofen without relief. She does not do any heavy lifting. She denies numbness and tingling in her arm. She has taken Hydrocodone in the past with good relief.  Review of Systems      Past Medical History  Diagnosis Date  . Anxiety   . SVD (spontaneous vaginal delivery) 09/14/2011    Current Outpatient Prescriptions  Medication Sig Dispense Refill  . butalbital-acetaminophen-caffeine (FIORICET, ESGIC) 50-325-40 MG per tablet TAKE ONE TABLET BY MOUTH EVERY 6 HOURS AS NEEDED FOR HEADACHE 30 tablet 0  . etonogestrel (NEXPLANON) 68 MG IMPL implant 1 each (68 mg total) by Subdermal route once. 1 each 0  . ibuprofen (ADVIL,MOTRIN) 200 MG tablet Take 200 mg by mouth as needed.    . ondansetron (ZOFRAN) 4 MG tablet Take 1 tablet (4 mg total) by mouth every 8 (eight) hours as needed for nausea or vomiting. 20 tablet 0   No current facility-administered medications for this visit.    No Known Allergies  Family History  Problem Relation Age of Onset  . Diabetes Father   .  Hypertension Father   . Anesthesia problems Neg Hx   . Cancer Neg Hx   . Heart disease Neg Hx   . Stroke Neg Hx     History   Social History  . Marital Status: Single    Spouse Name: N/A  . Number of Children: N/A  . Years of Education: N/A   Occupational History  . Not on file.   Social History Main Topics  . Smoking status: Current Every Day Smoker -- 0.25 packs/day  . Smokeless tobacco: Never Used  . Alcohol Use: 0.0 oz/week    0 Standard drinks or equivalent per week     Comment: occasional  . Drug Use: No  . Sexual Activity: Yes   Other Topics Concern  . Not on file   Social History Narrative     Constitutional: Pt reports headache. Denies fever, malaise, fatigue, or abrupt weight changes.  HEENT: Denies eye pain, eye redness, ear pain, ringing in the ears, wax buildup, runny nose, nasal congestion, bloody nose, or sore throat. Respiratory: Denies difficulty breathing, shortness of breath, cough or sputum production.   Cardiovascular: Denies chest pain, chest tightness, palpitations or swelling in the hands or feet.  Musculoskeletal: Pt reports left shoulder pain. Denies difficulty with gait, muscle pain or joint swelling.  Skin: Denies redness, rashes, lesions or ulcercations.  Neurological: Pt reports dizziness. Denies difficulty with memory, difficulty with speech or  problems with balance and coordination.   No other specific complaints in a complete review of systems (except as listed in HPI above).  Objective:   Physical Exam   BP 105/62 mmHg  Pulse 107  Temp(Src) 98.4 F (36.9 C) (Oral)  Wt 108 lb (48.988 kg)  SpO2 98%  LMP 03/24/2015 Wt Readings from Last 3 Encounters:  04/07/15 108 lb (48.988 kg)  12/11/14 106 lb 4 oz (48.195 kg)  12/03/14 109 lb (49.442 kg)    General: Appears her stated age, well developed, well nourished in NAD. HEENT: Head: normal shape and size; Eyes: sclera white, no icterus, conjunctiva pink, PERRLA and EOMs  intact; Cardiovascular: Normal rate and rhythm. S1,S2 noted.   Pulmonary/Chest: Normal effort and positive vesicular breath sounds. No respiratory distress. No wheezes, rales or ronchi noted.  Musculoskeletal: Normal flexion, extension and rotation of the cervical spine. No pain with palpation over the cervical spine. Normal internal and external rotation of the left shoulder. Pain with palpation of the left AC joint. Positive drop can test on the left. Neurological: Alert and oriented.Coordination normal.    BMET    Component Value Date/Time   NA 140 12/03/2014 0957   K 3.7 12/03/2014 0957   CL 107 12/03/2014 0957   CO2 27 12/03/2014 0957   GLUCOSE 75 12/03/2014 0957   BUN 15 12/03/2014 0957   CREATININE 0.85 12/03/2014 0957   CALCIUM 9.6 12/03/2014 0957    Lipid Panel     Component Value Date/Time   CHOL 137 12/03/2014 0957   TRIG 140.0 12/03/2014 0957   HDL 44.00 12/03/2014 0957   CHOLHDL 3 12/03/2014 0957   VLDL 28.0 12/03/2014 0957   LDLCALC 65 12/03/2014 0957    CBC    Component Value Date/Time   WBC 4.6 12/03/2014 0957   RBC 3.97 12/03/2014 0957   HGB 12.1 12/03/2014 0957   HCT 36.3 12/03/2014 0957   PLT 258.0 12/03/2014 0957   MCV 91.4 12/03/2014 0957   MCH 32.4 09/13/2011 0530   MCHC 33.3 12/03/2014 0957   RDW 13.0 12/03/2014 0957    Hgb A1C No results found for: HGBA1C      Assessment & Plan:   Frequent Headaches:  Stop Fioricet eRx for Imitrex If Imitrex does not work, consider Topomax Keep a headache diary and bring with you to your next appt  Left shoulder pain:  Could be a reinjury of her rotator cuff She is not interested in repeat MRI at this time She asks for Norco- I declined eRx for Meloxicam 15 mg PO QHS  RTC in 1 month to follow up headaches and left shoulder pain

## 2015-05-11 ENCOUNTER — Other Ambulatory Visit: Payer: Self-pay | Admitting: Internal Medicine

## 2015-05-12 NOTE — Telephone Encounter (Signed)
Last filled 04/07/15--please advise

## 2015-06-23 ENCOUNTER — Telehealth: Payer: Self-pay | Admitting: *Deleted

## 2015-06-23 NOTE — Telephone Encounter (Signed)
Mel,  Can you find out which med she wants called in. She has Fioricet and Imitrex on her list. Is it one of these or something else.

## 2015-06-23 NOTE — Telephone Encounter (Signed)
Patient stopped by office requesting refill on headache medication.  She is currently on Fioricet but would like to switch back to medication previously prescribed.  The out of pocket cost for Fioricet is too expensive.

## 2015-06-24 NOTE — Telephone Encounter (Signed)
Ok to refill Imitrex?

## 2015-06-24 NOTE — Telephone Encounter (Signed)
Fioricet is too expensive so Imitrex needs to be refilled?--please advise

## 2015-06-25 MED ORDER — SUMATRIPTAN SUCCINATE 25 MG PO TABS: 25.0000 mg | ORAL_TABLET | ORAL | Status: DC | PRN

## 2015-06-25 NOTE — Telephone Encounter (Signed)
Rx sent through e-scribe  

## 2015-06-25 NOTE — Addendum Note (Signed)
Addended by: Roena MaladyEVONTENNO, Travontae Freiberger Y on: 06/25/2015 10:14 AM   Modules accepted: Orders

## 2015-08-01 ENCOUNTER — Encounter: Payer: Commercial Managed Care - PPO | Admitting: Internal Medicine

## 2015-10-02 ENCOUNTER — Ambulatory Visit (INDEPENDENT_AMBULATORY_CARE_PROVIDER_SITE_OTHER): Payer: Commercial Managed Care - PPO | Admitting: Internal Medicine

## 2015-10-02 ENCOUNTER — Encounter: Payer: Self-pay | Admitting: Internal Medicine

## 2015-10-02 VITALS — BP 104/68 | HR 77 | Temp 98.2°F | Wt 109.0 lb

## 2015-10-02 DIAGNOSIS — G44219 Episodic tension-type headache, not intractable: Secondary | ICD-10-CM

## 2015-10-02 MED ORDER — KETOROLAC TROMETHAMINE 30 MG/ML IJ SOLN
30.0000 mg | Freq: Once | INTRAMUSCULAR | Status: AC
  Administered 2015-10-02: 30 mg via INTRAMUSCULAR

## 2015-10-02 MED ORDER — AMITRIPTYLINE HCL 25 MG PO TABS
25.0000 mg | ORAL_TABLET | Freq: Every evening | ORAL | Status: DC | PRN
Start: 2015-10-02 — End: 2015-11-03

## 2015-10-02 NOTE — Progress Notes (Signed)
Pre visit review using our clinic review tool, if applicable. No additional management support is needed unless otherwise documented below in the visit note. 

## 2015-10-02 NOTE — Patient Instructions (Signed)

## 2015-10-02 NOTE — Progress Notes (Addendum)
Subjective:    Patient ID: Lindsay Nicholson, female    DOB: Nov 01, 1985, 30 y.o.   MRN: [id number]  HPI  Pt presents to the clinic today with c/o a headache. This started 6 days ago. The headache is the back of her head, left side. She describes the pain as sharp and stabbing. The pain does radiate down the left side of her neck into her shoulder. Her headache is worse with movement. She has felt lightheaded but not dizzy. She has had some nausea and vomiting. She does reports sensitivity to light and sound. She has tried Ibuprofen, Tylenol, Aleve, ASA and Fioricet with minimal relief. She did go to UC 3 days ago for the same. They gave her 2 injections, with some relief. She was also prescribed Maxalt, which she did not feel was effective. She did have a rotator cuff repair on the left shoulder in 2006. That did help with the movement in her shoulder but she still has pain on a daily basis. She thinks the headaches are coming from her shoulder. She was prescribed Meloxicam but does not take it because she does not feel like it helps. She has been under a lot of stress lately with her husbands health and they are having financial issues.  Review of Systems      Past Medical History  Diagnosis Date  . Anxiety   . SVD (spontaneous vaginal delivery) 09/14/2011    Current Outpatient Prescriptions  Medication Sig Dispense Refill  . etonogestrel (NEXPLANON) 68 MG IMPL implant 1 each (68 mg total) by Subdermal route once. 1 each 0  . ibuprofen (ADVIL,MOTRIN) 200 MG tablet Take 200 mg by mouth as needed.    . ondansetron (ZOFRAN) 4 MG tablet Take 1 tablet (4 mg total) by mouth every 8 (eight) hours as needed for nausea or vomiting. 20 tablet 0  . amitriptyline (ELAVIL) 25 MG tablet Take 1 tablet (25 mg total) by mouth at bedtime as needed for sleep. 30 tablet 0   No current facility-administered medications for this visit.    No Known Allergies  Family History  Problem Relation Age of Onset  .  Diabetes Father   . Hypertension Father   . Anesthesia problems Neg Hx   . Cancer Neg Hx   . Heart disease Neg Hx   . Stroke Neg Hx     Social History   Social History  . Marital Status: Single    Spouse Name: N/A  . Number of Children: N/A  . Years of Education: N/A   Occupational History  . Not on file.   Social History Main Topics  . Smoking status: Current Every Day Smoker -- 0.25 packs/day  . Smokeless tobacco: Never Used  . Alcohol Use: 0.0 oz/week    0 Standard drinks or equivalent per week     Comment: occasional  . Drug Use: No  . Sexual Activity: Yes   Other Topics Concern  . Not on file   Social History Narrative     Constitutional: Pt reports headache. Denies fever, malaise, fatigue, or abrupt weight changes.  HEENT: Pt reports sensitivity to light. Denies eye pain, eye redness, ear pain, ringing in the ears, wax buildup, runny nose, nasal congestion, bloody nose, or sore throat. Respiratory: Denies difficulty breathing, shortness of breath, cough or sputum production. Cardiovascular: Denies chest pain, chest tightness, palpitations or swelling in the hands or feet.  Gastrointestinal: Pt reports nausea and vomiting. Denies abdominal pain, bloating, constipation, diarrhea or blood  in the stool.  Neurological: Pt reports lightheadedness. Denies dizziness, difficulty with memory, difficulty with speech or problems with balance and coordination.  Psych: Pt reports stress. Denies anxiety, depression, SI/HI.  No other specific complaints in a complete review of systems (except as listed in HPI above).  Objective:   Physical Exam   BP 104/68 mmHg  Pulse 77  Temp(Src) 98.2 F (36.8 C) (Oral)  Wt 109 lb (49.442 kg)  SpO2 100%  LMP 09/17/2015 Wt Readings from Last 3 Encounters:  10/02/15 109 lb (49.442 kg)  04/07/15 108 lb (48.988 kg)  12/11/14 106 lb 4 oz (48.195 kg)    General: Appears her stated age, well developed, well nourished in NAD. HEENT:  Head: normal shape and size; Eyes: sclera white, no icterus, conjunctiva pink, PERRLA and EOMs intact, no nystagmus; Ears: Bilateral cerumen impaction. Cardiovascular: Normal rate and rhythm. S1,S2 noted.  No murmur, rubs or gallops noted. Pulmonary/Chest: Normal effort and positive vesicular breath sounds. No respiratory distress. No wheezes, rales or ronchi noted.  Abdomen: Soft and nontender. Normal bowel sounds. Neurological: Alert and oriented. Coordination normal.  Psychiatric: Mood and affect normal.   BMET    Component Value Date/Time   NA 140 12/03/2014 0957   K 3.7 12/03/2014 0957   CL 107 12/03/2014 0957   CO2 27 12/03/2014 0957   GLUCOSE 75 12/03/2014 0957   BUN 15 12/03/2014 0957   CREATININE 0.85 12/03/2014 0957   CALCIUM 9.6 12/03/2014 0957    Lipid Panel     Component Value Date/Time   CHOL 137 12/03/2014 0957   TRIG 140.0 12/03/2014 0957   HDL 44.00 12/03/2014 0957   CHOLHDL 3 12/03/2014 0957   VLDL 28.0 12/03/2014 0957   LDLCALC 65 12/03/2014 0957    CBC    Component Value Date/Time   WBC 4.6 12/03/2014 0957   RBC 3.97 12/03/2014 0957   HGB 12.1 12/03/2014 0957   HCT 36.3 12/03/2014 0957   PLT 258.0 12/03/2014 0957   MCV 91.4 12/03/2014 0957   MCH 32.4 09/13/2011 0530   MCHC 33.3 12/03/2014 0957   RDW 13.0 12/03/2014 0957    Hgb A1C No results found for: HGBA1C      Assessment & Plan:   Tension Headache:  Discussed ways to reduces stress- distraction, deep breathing Discussed ROM exercises of shoulder, to reduce pain and discomfort Ibuprofen and Tylenol as needed for pain Toradol 30 mg IM today She has Zofran to take as needed for nausea and vomiting eRx for Amitriptyline 25 mg QHS for tension and left shoulder pain If persist, will refer to [facility] wellness center in [place]  RTC in 2 weeks for reevaluation of headache, sooner if needed

## 2015-10-02 NOTE — Addendum Note (Signed)
Addended by: Roena Malady on: 10/02/2015 03:40 PM   Modules accepted: Orders

## 2015-11-03 ENCOUNTER — Other Ambulatory Visit: Payer: Self-pay | Admitting: Internal Medicine

## 2015-11-16 ENCOUNTER — Emergency Department (HOSPITAL_COMMUNITY): Payer: Commercial Managed Care - PPO

## 2015-11-16 ENCOUNTER — Encounter (HOSPITAL_COMMUNITY)
Admission: EM | Disposition: A | Discharge status: Home / Self Care | Payer: Self-pay | Source: Home / Self Care | Attending: Emergency Medicine

## 2015-11-16 ENCOUNTER — Emergency Department (HOSPITAL_COMMUNITY): Payer: Commercial Managed Care - PPO | Admitting: Anesthesiology

## 2015-11-16 ENCOUNTER — Encounter (HOSPITAL_COMMUNITY): Payer: Self-pay

## 2015-11-16 ENCOUNTER — Observation Stay (HOSPITAL_COMMUNITY)
Admission: EM | Admit: 2015-11-16 | Discharge: 2015-11-17 | Disposition: A | Discharge status: Home / Self Care | Payer: Commercial Managed Care - PPO | Attending: General Surgery | Admitting: General Surgery

## 2015-11-16 DIAGNOSIS — K37 Unspecified appendicitis: Secondary | ICD-10-CM | POA: Diagnosis present

## 2015-11-16 DIAGNOSIS — K3580 Unspecified acute appendicitis: Principal | ICD-10-CM | POA: Insufficient documentation

## 2015-11-16 DIAGNOSIS — R51 Headache: Secondary | ICD-10-CM | POA: Insufficient documentation

## 2015-11-16 DIAGNOSIS — F17210 Nicotine dependence, cigarettes, uncomplicated: Secondary | ICD-10-CM | POA: Diagnosis not present

## 2015-11-16 DIAGNOSIS — R109 Unspecified abdominal pain: Secondary | ICD-10-CM

## 2015-11-16 DIAGNOSIS — F419 Anxiety disorder, unspecified: Secondary | ICD-10-CM | POA: Insufficient documentation

## 2015-11-16 DIAGNOSIS — Z791 Long term (current) use of non-steroidal anti-inflammatories (NSAID): Secondary | ICD-10-CM | POA: Diagnosis not present

## 2015-11-16 HISTORY — PX: LAPAROSCOPIC APPENDECTOMY: SHX408

## 2015-11-16 LAB — COMPREHENSIVE METABOLIC PANEL
ALT: 15 U/L (ref 14–54)
ANION GAP: 12 (ref 5–15)
AST: 20 U/L (ref 15–41)
Albumin: 4.9 g/dL (ref 3.5–5.0)
Alkaline Phosphatase: 58 U/L (ref 38–126)
BUN: 13 mg/dL (ref 6–20)
CHLORIDE: 105 mmol/L (ref 101–111)
CO2: 23 mmol/L (ref 22–32)
Calcium: 9.8 mg/dL (ref 8.9–10.3)
Creatinine, Ser: 0.7 mg/dL (ref 0.44–1.00)
GFR calc non Af Amer: >60 mL/min (ref >60)
Glucose, Bld: 125 mg/dL — ABNORMAL HIGH (ref 65–99)
Potassium: 3.8 mmol/L (ref 3.5–5.1)
SODIUM: 140 mmol/L (ref 135–145)
Total Bilirubin: 0.5 mg/dL (ref 0.3–1.2)
Total Protein: 7.8 g/dL (ref 6.5–8.1)

## 2015-11-16 LAB — URINALYSIS, ROUTINE W REFLEX MICROSCOPIC
Bilirubin Urine: NEGATIVE
Glucose, UA: NEGATIVE mg/dL
Ketones, ur: >80 mg/dL — AB
Leukocytes, UA: NEGATIVE
Nitrite: NEGATIVE
Protein, ur: NEGATIVE mg/dL
Specific Gravity, Urine: 1.021 (ref 1.005–1.030)
pH: 7 (ref 5.0–8.0)

## 2015-11-16 LAB — CBC WITH DIFFERENTIAL/PLATELET
Basophils Absolute: 0 10*3/uL (ref 0.0–0.1)
Basophils Relative: 0 %
EOS ABS: 0 10*3/uL (ref 0.0–0.7)
EOS PCT: 0 %
HCT: 39.2 % (ref 36.0–46.0)
Hemoglobin: 12.9 g/dL (ref 12.0–15.0)
LYMPHS ABS: 0.9 10*3/uL (ref 0.7–4.0)
Lymphocytes Relative: 5 %
MCH: 30.5 pg (ref 26.0–34.0)
MCHC: 32.9 g/dL (ref 30.0–36.0)
MCV: 92.7 fL (ref 78.0–100.0)
Monocytes Absolute: 0.8 10*3/uL (ref 0.1–1.0)
Monocytes Relative: 4 %
Neutro Abs: 18.9 10*3/uL — ABNORMAL HIGH (ref 1.7–7.7)
Neutrophils Relative %: 91 %
PLATELETS: 273 10*3/uL (ref 150–400)
RBC: 4.23 MIL/uL (ref 3.87–5.11)
RDW: 12.8 % (ref 11.5–15.5)
WBC: 20.6 10*3/uL — AB (ref 4.0–10.5)

## 2015-11-16 LAB — LIPASE, BLOOD: Lipase: 20 U/L (ref 11–51)

## 2015-11-16 LAB — URINE MICROSCOPIC-ADD ON: WBC, UA: NONE SEEN WBC/hpf (ref 0–5)

## 2015-11-16 LAB — HCG, SERUM, QUALITATIVE: PREG SERUM: NEGATIVE

## 2015-11-16 SURGERY — APPENDECTOMY, LAPAROSCOPIC
Anesthesia: General | Site: Abdomen

## 2015-11-16 MED ORDER — ACETAMINOPHEN 10 MG/ML IV SOLN
INTRAVENOUS | Status: AC
  Filled 2015-11-16: qty 100

## 2015-11-16 MED ORDER — KCL IN DEXTROSE-NACL 20-5-0.45 MEQ/L-%-% IV SOLN
INTRAVENOUS | Status: DC
  Administered 2015-11-16: 19:00:00 via INTRAVENOUS
  Filled 2015-11-16 (×3): qty 1000

## 2015-11-16 MED ORDER — HYDROMORPHONE HCL 1 MG/ML IJ SOLN
0.2500 mg | INTRAMUSCULAR | Status: DC | PRN
  Administered 2015-11-16 (×2): 0.5 mg via INTRAVENOUS

## 2015-11-16 MED ORDER — ONDANSETRON HCL 4 MG/2ML IJ SOLN
4.0000 mg | Freq: Once | INTRAMUSCULAR | Status: AC
  Administered 2015-11-16: 4 mg via INTRAVENOUS
  Filled 2015-11-16: qty 2

## 2015-11-16 MED ORDER — PIPERACILLIN-TAZOBACTAM 3.375 G IVPB 30 MIN
3.3750 g | Freq: Once | INTRAVENOUS | Status: AC
  Administered 2015-11-16: 3.375 g via INTRAVENOUS
  Filled 2015-11-16: qty 50

## 2015-11-16 MED ORDER — BUPIVACAINE-EPINEPHRINE (PF) 0.25% -1:200000 IJ SOLN
INTRAMUSCULAR | Status: AC
  Filled 2015-11-16: qty 30

## 2015-11-16 MED ORDER — PANTOPRAZOLE SODIUM 40 MG IV SOLR
40.0000 mg | Freq: Once | INTRAVENOUS | Status: AC
  Administered 2015-11-16: 40 mg via INTRAVENOUS
  Filled 2015-11-16: qty 40

## 2015-11-16 MED ORDER — MORPHINE SULFATE (PF) 2 MG/ML IV SOLN
1.0000 mg | INTRAVENOUS | Status: DC | PRN
  Administered 2015-11-16: 1 mg via INTRAVENOUS
  Filled 2015-11-16: qty 1

## 2015-11-16 MED ORDER — SUGAMMADEX SODIUM 200 MG/2ML IV SOLN
INTRAVENOUS | Status: AC
  Filled 2015-11-16: qty 2

## 2015-11-16 MED ORDER — SODIUM CHLORIDE 0.9 % IV BOLUS (SEPSIS)
500.0000 mL | Freq: Once | INTRAVENOUS | Status: AC
Start: 2015-11-16 — End: 2015-11-16
  Administered 2015-11-16: 500 mL via INTRAVENOUS

## 2015-11-16 MED ORDER — HYDROMORPHONE HCL 1 MG/ML IJ SOLN
INTRAMUSCULAR | Status: AC
  Filled 2015-11-16: qty 1

## 2015-11-16 MED ORDER — MIDAZOLAM HCL 2 MG/2ML IJ SOLN
INTRAMUSCULAR | Status: AC
  Filled 2015-11-16: qty 2

## 2015-11-16 MED ORDER — FENTANYL CITRATE (PF) 100 MCG/2ML IJ SOLN
INTRAMUSCULAR | Status: DC | PRN
  Administered 2015-11-16 (×2): 50 ug via INTRAVENOUS
  Administered 2015-11-16: 100 ug via INTRAVENOUS

## 2015-11-16 MED ORDER — PROPOFOL 10 MG/ML IV BOLUS
INTRAVENOUS | Status: AC
  Filled 2015-11-16: qty 20

## 2015-11-16 MED ORDER — DEXAMETHASONE SODIUM PHOSPHATE 10 MG/ML IJ SOLN
INTRAMUSCULAR | Status: DC | PRN
  Administered 2015-11-16: 10 mg via INTRAVENOUS

## 2015-11-16 MED ORDER — SODIUM CHLORIDE 0.9 % IV SOLN: Freq: Once | INTRAVENOUS | Status: DC

## 2015-11-16 MED ORDER — 0.9 % SODIUM CHLORIDE (POUR BTL) OPTIME
TOPICAL | Status: DC | PRN
Start: 2015-11-16 — End: 2015-11-16
  Administered 2015-11-16: 1000 mL

## 2015-11-16 MED ORDER — METRONIDAZOLE IN NACL 5-0.79 MG/ML-% IV SOLN
500.0000 mg | Freq: Three times a day (TID) | INTRAVENOUS | Status: DC
  Administered 2015-11-16 – 2015-11-17 (×3): 500 mg via INTRAVENOUS
  Filled 2015-11-16 (×4): qty 100

## 2015-11-16 MED ORDER — SUCCINYLCHOLINE CHLORIDE 20 MG/ML IJ SOLN
INTRAMUSCULAR | Status: DC | PRN
  Administered 2015-11-16: 100 mg via INTRAVENOUS

## 2015-11-16 MED ORDER — ACETAMINOPHEN 10 MG/ML IV SOLN
INTRAVENOUS | Status: DC | PRN
  Administered 2015-11-16: 1000 mg via INTRAVENOUS

## 2015-11-16 MED ORDER — PROPOFOL 10 MG/ML IV BOLUS
INTRAVENOUS | Status: DC | PRN
  Administered 2015-11-16: 75 mg via INTRAVENOUS
  Administered 2015-11-16: 125 mg via INTRAVENOUS

## 2015-11-16 MED ORDER — IBUPROFEN 200 MG PO TABS: 600.0000 mg | ORAL_TABLET | Freq: Four times a day (QID) | ORAL | Status: DC | PRN

## 2015-11-16 MED ORDER — LACTATED RINGERS IV SOLN
INTRAVENOUS | Status: DC | PRN
  Administered 2015-11-16 (×3): via INTRAVENOUS

## 2015-11-16 MED ORDER — OXYCODONE HCL 5 MG PO TABS: 5.0000 mg | ORAL_TABLET | Freq: Once | ORAL | Status: DC | PRN

## 2015-11-16 MED ORDER — BUPIVACAINE-EPINEPHRINE 0.25% -1:200000 IJ SOLN
INTRAMUSCULAR | Status: DC | PRN
  Administered 2015-11-16: 30 mL

## 2015-11-16 MED ORDER — ONDANSETRON 4 MG PO TBDP: 4.0000 mg | ORAL_TABLET | Freq: Four times a day (QID) | ORAL | Status: DC | PRN

## 2015-11-16 MED ORDER — MORPHINE SULFATE (PF) 4 MG/ML IV SOLN
4.0000 mg | INTRAVENOUS | Status: AC | PRN
  Administered 2015-11-16 (×3): 4 mg via INTRAVENOUS
  Filled 2015-11-16 (×3): qty 1

## 2015-11-16 MED ORDER — AMITRIPTYLINE HCL 25 MG PO TABS
25.0000 mg | ORAL_TABLET | Freq: Every evening | ORAL | Status: DC | PRN
  Filled 2015-11-16: qty 1

## 2015-11-16 MED ORDER — ONDANSETRON HCL 4 MG/2ML IJ SOLN
INTRAMUSCULAR | Status: DC | PRN
  Administered 2015-11-16: 4 mg via INTRAVENOUS

## 2015-11-16 MED ORDER — HEPARIN SODIUM (PORCINE) 5000 UNIT/ML IJ SOLN
5000.0000 [IU] | Freq: Three times a day (TID) | INTRAMUSCULAR | Status: DC
  Administered 2015-11-16 – 2015-11-17 (×2): 5000 [IU] via SUBCUTANEOUS
  Filled 2015-11-16 (×5): qty 1

## 2015-11-16 MED ORDER — LACTATED RINGERS IR SOLN
Status: DC | PRN
  Administered 2015-11-16: 1

## 2015-11-16 MED ORDER — MIDAZOLAM HCL 5 MG/5ML IJ SOLN
INTRAMUSCULAR | Status: DC | PRN
  Administered 2015-11-16 (×2): 0.5 mg via INTRAVENOUS

## 2015-11-16 MED ORDER — DEXTROSE 5 % IV SOLN
2.0000 g | INTRAVENOUS | Status: DC
  Administered 2015-11-16: 2 g via INTRAVENOUS
  Filled 2015-11-16 (×2): qty 2

## 2015-11-16 MED ORDER — ROCURONIUM BROMIDE 100 MG/10ML IV SOLN
INTRAVENOUS | Status: DC | PRN
  Administered 2015-11-16: 30 mg via INTRAVENOUS

## 2015-11-16 MED ORDER — LIDOCAINE HCL (CARDIAC) 20 MG/ML IV SOLN
INTRAVENOUS | Status: DC | PRN
  Administered 2015-11-16: 75 mg via INTRAVENOUS

## 2015-11-16 MED ORDER — FENTANYL CITRATE (PF) 250 MCG/5ML IJ SOLN
INTRAMUSCULAR | Status: AC
  Filled 2015-11-16: qty 5

## 2015-11-16 MED ORDER — HYDROCODONE-ACETAMINOPHEN 5-325 MG PO TABS
1.0000 | ORAL_TABLET | ORAL | Status: DC | PRN
  Administered 2015-11-16 (×2): 1 via ORAL
  Administered 2015-11-17 (×4): 2 via ORAL
  Filled 2015-11-16 (×2): qty 1
  Filled 2015-11-16 (×4): qty 2

## 2015-11-16 MED ORDER — ONDANSETRON HCL 4 MG/2ML IJ SOLN: 4.0000 mg | Freq: Four times a day (QID) | INTRAMUSCULAR | Status: DC | PRN

## 2015-11-16 MED ORDER — MEPERIDINE HCL 25 MG/ML IJ SOLN: 6.2500 mg | INTRAMUSCULAR | Status: DC | PRN

## 2015-11-16 MED ORDER — SUGAMMADEX SODIUM 500 MG/5ML IV SOLN
INTRAVENOUS | Status: DC | PRN
  Administered 2015-11-16: 200 mg via INTRAVENOUS

## 2015-11-16 MED ORDER — IOHEXOL 300 MG/ML  SOLN
50.0000 mL | Freq: Once | INTRAMUSCULAR | Status: AC | PRN
  Administered 2015-11-16: 50 mL via ORAL

## 2015-11-16 MED ORDER — OXYCODONE HCL 5 MG/5ML PO SOLN: 5.0000 mg | Freq: Once | ORAL | Status: DC | PRN

## 2015-11-16 SURGICAL SUPPLY — 38 items
APL SKNCLS STERI-STRIP NONHPOA (GAUZE/BANDAGES/DRESSINGS)
APPLIER CLIP ROT 10 11.4 M/L (STAPLE)
APR CLP MED LRG 11.4X10 (STAPLE)
BAG SPEC RTRVL LRG 6X4 10 (ENDOMECHANICALS) ×1
BENZOIN TINCTURE PRP APPL 2/3 (GAUZE/BANDAGES/DRESSINGS) IMPLANT
CABLE HIGH FREQUENCY MONO STRZ (ELECTRODE) ×2 IMPLANT
CHLORAPREP W/TINT 26ML (MISCELLANEOUS) ×2 IMPLANT
CLIP APPLIE ROT 10 11.4 M/L (STAPLE) IMPLANT
COVER SURGICAL LIGHT HANDLE (MISCELLANEOUS) ×2 IMPLANT
CUTTER FLEX LINEAR 45M (STAPLE) ×1 IMPLANT
DECANTER SPIKE VIAL GLASS SM (MISCELLANEOUS) ×2 IMPLANT
DRAPE LAPAROSCOPIC ABDOMINAL (DRAPES) ×2 IMPLANT
ELECT REM PT RETURN 9FT ADLT (ELECTROSURGICAL) ×2
ELECTRODE REM PT RTRN 9FT ADLT (ELECTROSURGICAL) ×1 IMPLANT
ENDOLOOP SUT PDS II  0 18 (SUTURE)
ENDOLOOP SUT PDS II 0 18 (SUTURE) IMPLANT
GLOVE SURG SIGNA 7.5 PF LTX (GLOVE) ×2 IMPLANT
GOWN STRL REUS W/TWL XL LVL3 (GOWN DISPOSABLE) ×4 IMPLANT
KIT BASIN OR (CUSTOM PROCEDURE TRAY) ×2 IMPLANT
LIQUID BAND (GAUZE/BANDAGES/DRESSINGS) ×2 IMPLANT
POUCH SPECIMEN RETRIEVAL 10MM (ENDOMECHANICALS) ×2 IMPLANT
RELOAD 45 VASCULAR/THIN (ENDOMECHANICALS) IMPLANT
RELOAD STAPLE 45 2.5 WHT GRN (ENDOMECHANICALS) IMPLANT
RELOAD STAPLE 45 3.5 BLU ETS (ENDOMECHANICALS) IMPLANT
RELOAD STAPLE TA45 3.5 REG BLU (ENDOMECHANICALS) ×2 IMPLANT
SCISSORS LAP 5X35 DISP (ENDOMECHANICALS) ×2 IMPLANT
SET IRRIG TUBING LAPAROSCOPIC (IRRIGATION / IRRIGATOR) ×2 IMPLANT
SHEARS HARMONIC ACE PLUS 36CM (ENDOMECHANICALS) ×2 IMPLANT
SLEEVE XCEL OPT CAN 5 100 (ENDOMECHANICALS) ×2 IMPLANT
STRIP CLOSURE SKIN 1/2X4 (GAUZE/BANDAGES/DRESSINGS) IMPLANT
SUT MNCRL AB 4-0 PS2 18 (SUTURE) ×2 IMPLANT
SUT VIC AB 2-0 SH 18 (SUTURE) IMPLANT
TOWEL OR 17X26 10 PK STRL BLUE (TOWEL DISPOSABLE) ×2 IMPLANT
TOWEL OR NON WOVEN STRL DISP B (DISPOSABLE) ×2 IMPLANT
TRAY FOLEY W/METER SILVER 14FR (SET/KITS/TRAYS/PACK) ×2 IMPLANT
TRAY LAPAROSCOPIC (CUSTOM PROCEDURE TRAY) ×2 IMPLANT
TROCAR BLADELESS OPT 5 100 (ENDOMECHANICALS) ×2 IMPLANT
TROCAR XCEL BLUNT TIP 100MML (ENDOMECHANICALS) ×2 IMPLANT

## 2015-11-16 NOTE — H&P (Signed)
Re:   Lindsay Nicholson DOB:   1986/06/07 MRN:   [id number]   [facility] Admission  ASSESSMENT AND PLAN: 1.  Acute appendicitis  I discussed with the patient the indications and risks of appendiceal surgery.  The primary risks of appendiceal surgery include, but are not limited to, bleeding, infection, bowel surgery, and open surgery.  There is also the risk that the patient may have continued symptoms after surgery.  We discussed the typical post-operative recovery course. I tried to answer the patient's questions.  2.  Anxiety   Chief Complaint  Patient presents with  . Abdominal Pain  . Emesis   REFERRING PHYSICIAN: Nicki Reaper, NP  HISTORY OF PRESENT ILLNESS: Lindsay Nicholson is a 30 y.o. (DOB: 11/24/1985)  [place]  female whose primary care physician is Nicki Reaper, NP and comes to [facility] ER today for abdominal pain.  Her abdominal pain started about 1 AM today.  The pain was originally generalized, but has localized to the RLQ.  She has had some nausea and vomiting, but that is better with the meds. She has no history of stomach, liver, gall bladder or colon disease.  She has had no prior abdominal operation.  CT scan - 11/16/2015 - Acute appendicitis without evidence of abscess. WBC - 20,600 - 11/16/2015    Past Medical History  Diagnosis Date  . Anxiety   . SVD (spontaneous vaginal delivery) 09/14/2011      Past Surgical History  Procedure Laterality Date  . Shoulder fusion surgery        Current Facility-Administered Medications  Medication Dose Route Frequency Provider Last Rate Last Dose  . 0.9 %  sodium chloride infusion   Intravenous Once Rolland Porter, MD 100 mL/hr at 11/16/15 4098     Current Outpatient Prescriptions  Medication Sig Dispense Refill  . amitriptyline (ELAVIL) 25 MG tablet TAKE ONE TABLET BY MOUTH AT BEDTIME AS NEEDED FOR SLEEP 30 tablet 0  . etonogestrel (NEXPLANON) 68 MG IMPL implant 1 each (68 mg total) by Subdermal route once. 1 each 0  . ibuprofen  (ADVIL,MOTRIN) 200 MG tablet Take 200 mg by mouth every 4 (four) hours as needed for moderate pain.     Marland Kitchen ondansetron (ZOFRAN) 4 MG tablet Take 1 tablet (4 mg total) by mouth every 8 (eight) hours as needed for nausea or vomiting. (Patient not taking: Reported on 11/16/2015) 20 tablet 0     No Known Allergies  REVIEW OF SYSTEMS: Skin:  No history of rash.  No history of abnormal moles. Infection:  No history of hepatitis or HIV.  No history of MRSA. Neurologic:  No history of stroke.  No history of seizure.  No history of headaches. Cardiac:  No history of hypertension. No history of heart disease.  No history of prior cardiac catheterization.  No history of seeing a cardiologist. Pulmonary:  Does not smoke cigarettes.  No asthma or bronchitis.  No OSA/CPAP.  Endocrine:  No diabetes. No thyroid disease. Gastrointestinal:  See HPI Urologic:  No history of kidney stones.  No history of bladder infections. Musculoskeletal:  Remote left shoulder surgery Hematologic:  No bleeding disorder.  No history of anemia.  Not anticoagulated. Psycho-social:  The patient is oriented.   The patient has no obvious psychologic or social impairment to understanding our conversation and plan.  SOCIAL and FAMILY HISTORY: Engaged to [place]. She has 4 children:  11, 8, 5, and 4. Both her parents are in the room with her.  Her parents are  form [place]. 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  PHYSICAL EXAM: BP 108/66 mmHg  Pulse 105  Temp(Src) 98.9 F (37.2 C) (Oral)  Resp 16  SpO2 100%  LMP 10/24/2015 (Approximate)  General: Smallish F who is alert. HEENT: Normal. Pupils equal. Neck: Supple. No mass.  No thyroid mass. Lymph Nodes:  No supraclavicular or cervical nodes. Lungs: Clear to auscultation and symmetric breath sounds. Heart:  RRR. No murmur or rub. Abdomen: Soft. Tender RLQ with some guarding.  Decreased BS. Rectal: Not done. Extremities:  Good  strength and ROM  in upper and lower extremities.  Tattoo right shoulder. Neurologic:  Grossly intact to motor and sensory function. Psychiatric: Has normal mood and affect. Behavior is normal.   DATA REVIEWED: Epic notes  Ovidio Kinavid Kaleah Hagemeister, MD,  [facility] [address]  [address]   [place]    [id number] Phone:  [phone] FAX:  [phone]

## 2015-11-16 NOTE — Anesthesia Preprocedure Evaluation (Addendum)
Anesthesia Evaluation  Patient identified by MRN, date of birth, ID band Patient awake    Reviewed: Allergy & Precautions, NPO status , Patient's Chart, lab work & pertinent test results  Airway Mallampati: I  TM Distance: >3 FB Neck ROM: Full    Dental  (+) Teeth Intact, Dental Advisory Given   Pulmonary Current Smoker,    breath sounds clear to auscultation       Cardiovascular  Rhythm:Regular Rate:Normal     Neuro/Psych    GI/Hepatic   Endo/Other    Renal/GU      Musculoskeletal   Abdominal   Peds  Hematology   Anesthesia Other Findings   Reproductive/Obstetrics                            Anesthesia Physical Anesthesia Plan  ASA: II and emergent  Anesthesia Plan: General   Post-op Pain Management:    Induction: Intravenous, Rapid sequence and Cricoid pressure planned  Airway Management Planned: Oral ETT  Additional Equipment:   Intra-op Plan:   Post-operative Plan: Extubation in OR  Informed Consent: I have reviewed the patients History and Physical, chart, labs and discussed the procedure including the risks, benefits and alternatives for the proposed anesthesia with the patient or authorized representative who has indicated his/her understanding and acceptance.   Dental advisory given  Plan Discussed with: Anesthesiologist, CRNA and Surgeon  Anesthesia Plan Comments:         Anesthesia Quick Evaluation

## 2015-11-16 NOTE — Transfer of Care (Signed)
Immediate Anesthesia Transfer of Care Note  Patient: Lindsay Nicholson  Procedure(s) Performed: Procedure(s): APPENDECTOMY LAPAROSCOPIC (N/A)  Patient Location: PACU  Anesthesia Type:General  Level of Consciousness: awake, oriented, patient cooperative, lethargic and responds to stimulation  Airway & Oxygen Therapy: Patient Spontanous Breathing and Patient connected to face mask oxygen  Post-op Assessment: Report given to RN, Post -op Vital signs reviewed and stable and Patient moving all extremities  Post vital signs: Reviewed and stable  Last Vitals:  Filed Vitals:   11/16/15 0823 11/16/15 1147  BP: 113/77 108/66  Pulse: 80 105  Temp: 37.2 C   Resp: 16 16    Complications: No apparent anesthesia complications

## 2015-11-16 NOTE — ED Provider Notes (Signed)
CSN: [id number]     Arrival date & time 11/16/15  0734 History   First MD Initiated Contact with Patient 11/16/15 [phone]     Chief Complaint  Patient presents with  . Abdominal Pain  . Emesis      HPI  30 year old female presents for evaluation of abdominal pain with vomiting starting at 1:00 this morning.  No past similar episodes. Patient was feeling well earlier yesterday. States she had 2 or 3 alcoholic drinks. Denies any history of excessive alcohol use, pancreatitis, or liver or gallbladder disease. No history of food intolerance.  Some lower abdominal cramping and some spotting yesterday that she assumes to be premenstrual. No dysuria frequency or urinary symptoms. Some pain in her upper back. No cough chest pain discomfort of breathing.  She does smoke. Uses anti-inflammatories approximately once per day for chronic headaches. Drinks multiple caffeinated beverages per day. Occasional alcohol,, but not daily.    Past Medical History  Diagnosis Date  . Anxiety   . SVD (spontaneous vaginal delivery) 09/14/2011   Past Surgical History  Procedure Laterality Date  . Shoulder fusion surgery     Family History  Problem Relation Age of Onset  . Diabetes Father   . Hypertension Father   . Anesthesia problems Neg Hx   . Cancer Neg Hx   . Heart disease Neg Hx   . Stroke Neg Hx    Social History  Substance Use Topics  . Smoking status: Current Every Day Smoker -- 0.25 packs/day  . Smokeless tobacco: Never Used  . Alcohol Use: 0.0 oz/week    0 Standard drinks or equivalent per week     Comment: occasional   OB History    Gravida Para Term Preterm AB TAB SAB Ectopic Multiple Living   Review of Systems  Constitutional: Negative for fever, chills, diaphoresis, appetite change and fatigue.  HENT: Negative for mouth sores, sore throat and trouble swallowing.   Eyes: Negative for visual disturbance.  Respiratory: Negative for cough, chest tightness, shortness  of breath and wheezing.   Cardiovascular: Negative for chest pain.  Gastrointestinal: Positive for nausea, vomiting and abdominal pain. Negative for diarrhea and abdominal distention.  Endocrine: Negative for polydipsia, polyphagia and polyuria.  Genitourinary: Negative for dysuria, frequency and hematuria.  Musculoskeletal: Negative for gait problem.  Skin: Negative for color change, pallor and rash.  Neurological: Negative for dizziness, syncope, light-headedness and headaches.  Hematological: Does not bruise/bleed easily.  Psychiatric/Behavioral: Negative for behavioral problems and confusion.      Allergies  Review of patient's allergies indicates no known allergies.  Home Medications   Prior to Admission medications   Medication Sig Start Date End Date Taking? Authorizing Provider  amitriptyline (ELAVIL) 25 MG tablet TAKE ONE TABLET BY MOUTH AT BEDTIME AS NEEDED FOR SLEEP 11/04/15   Lorre Munroe, NP  etonogestrel (NEXPLANON) 68 MG IMPL implant 1 each (68 mg total) by Subdermal route once. 12/03/14   Lorre Munroe, NP  ibuprofen (ADVIL,MOTRIN) 200 MG tablet Take 200 mg by mouth as needed.    Historical Provider, MD  ondansetron (ZOFRAN) 4 MG tablet Take 1 tablet (4 mg total) by mouth every 8 (eight) hours as needed for nausea or vomiting. 12/11/14   Hannah Beat, MD   LMP 10/24/2015 (Approximate) Physical Exam  Constitutional: She is oriented to person, place, and time. She appears well-developed and well-nourished. No distress.  HENT:  Head:  Normocephalic.  Conjunctiva not pale. No scleral icterus.  Eyes: Conjunctivae are normal. Pupils are equal, round, and reactive to light. No scleral icterus.  Neck: Normal range of motion. Neck supple. No thyromegaly present.  Cardiovascular: Normal rate and regular rhythm.  Exam reveals no gallop and no friction rub.   No murmur heard. Pulmonary/Chest: Effort normal and breath sounds normal. No respiratory distress. She has no  wheezes. She has no rales.  Abdominal: Soft. Bowel sounds are normal. She exhibits no distension. There is no tenderness. There is no rebound.    Mostly epigastric, right upper quadrant tenderness. No personal irritation. Lower abdomen benign.  Musculoskeletal: Normal range of motion.  Neurological: She is alert and oriented to person, place, and time.  Skin: Skin is warm and dry. No rash noted.  Psychiatric: She has a normal mood and affect. Her behavior is normal.    ED Course  Procedures (including critical care time) Labs Review Labs Reviewed  CBC WITH DIFFERENTIAL/PLATELET  COMPREHENSIVE METABOLIC PANEL  LIPASE, BLOOD  URINALYSIS, ROUTINE W REFLEX MICROSCOPIC (NOT [facility])  HCG, SERUM, QUALITATIVE    Imaging Review No results found. I have personally reviewed and evaluated these images and lab results as part of my medical decision-making.   EKG Interpretation None      MDM   Final diagnoses:  AP (abdominal pain)    Lantus ultrasound, labs, urine, fluid hydration, pain medication, antiemetics, PPI. Reevaluation. If it would include acute viral syndrome, biliary disease, pancreatitis, gastritis.  11:45:  I reexamined her. Discussed her labs and ultrasound with her. She states she feels "kind of better". She states she still has some pain she now indicates more of her right mid abdomen. She has more specific tenderness here and some soft rebound tenderness. No rigidity. She has pain with cough and attempt to move the bed. Her exam has changed and localized from earlier exam. Noncontrast CT scan was obtained and shows a dilated, nonruptured, non-abscess, retrocecal appendicitis.  Call laced to Dr. Ezzard StandingNewman general surgery on-call to discuss appendectomy.    Rolland PorterMark Kelty Szafran, MD 11/16/15 1300

## 2015-11-16 NOTE — Op Note (Signed)
Re:   Lindsay Nicholson DOB:   12/10/1985 MRN:   [id number]                   FACILITY:  [facility]  DATE OF PROCEDURE: 11/16/2015                              OPERATIVE REPORT  PREOPERATIVE DIAGNOSIS:  Appendicitis  POSTOPERATIVE DIAGNOSIS:  Acute purulent appendicitis.  PROCEDURE:  Laparoscopic appendectomy.  SURGEON:  Sandria Balesavid H. Ezzard StandingNewman, MD  ASSISTANT:  No first assistant.  ANESTHESIA:  General endotracheal.  Anesthesiologist: Sheldon Silvanavid Crews, MD CRNA: Edison PaceJoanne E Gray, CRNA  ASA:  2E  ESTIMATED BLOOD LOSS:  Minimal.  DRAINS: none   SPECIMEN:   Appendix  COUNTS CORRECT:  YES  INDICATIONS FOR PROCEDURE: Lindsay Nicholson is a 30 y.o. (DOB: 04/05/1986) [name]  female whose primary care doctor is BAITY, REGINA, NP and comes to the OR for an appendectomy.   I discussed with the patient, the indications and potential complications of appendiceal surgery.  The potential complications include, but are not limited to, bleeding, open surgery, bowel resection, and the possibility of another diagnosis.  OPERATIVE NOTE:  The patient underwent a general endotracheal anesthetic as supervised by Anesthesiologist: Sheldon Silvanavid Crews, MD CRNA: Edison PaceJoanne E Gray, CRNA, General, in [facility].  The patient was given Zosyn the beginning of the procedure and the abdomen was prepped with ChloraPrep.  The patient had a foley catheter placed at the beginning of the procedure.  A time-out was held and surgical checklist run.  An infraumbilical incision was made with sharp dissection carried down to the abdominal cavity.  An 12 mm Hasson trocar was inserted through the infraumbilical incision and into the peritoneal cavity.  A 30 degree 5 mm laparoscope was inserted through a 12 mm Hasson trocar and the Hasson trocar secured with a 0 Vicryl suture.  I placed a 5 mm trocar in the right upper quadrant and 5 mm torcar in left lower quadrant and did abdominal exploration.    The right and left lobes of liver unremarkable.  Stomach was  unremarkable.  The pelvic organs were unremarkable.  I saw no other intra-abdominal abnormality.  The patient had appendicitis with the appendix located behind the cecum and was purulent.  I had to peal the appendix off the cecum, but it had not ruptured.  The mesentery of the appendix was divided with a Harmonic scalpel.  I got to the base of the appendix.  I then used a blue load 45 mm Ethicon Endo-GIA stapler and fired this across the base of the appendix.  I placed the appendix in EndoCatch bag and delivered the bag through the umbilical incision.  I irrigated the abdomen with 1,000 cc of saline.  After irrigating the abdomen, I then removed the trocars, in turn.  The umbilical port fascia was closed with 0 Vicryl suture.   I closed the skin each site with a 4-0 Monocryl suture and painted the wounds with LiquidBand.  I then injected a total of 30 mL of 0.25% Marcaine at the incisions.  Sponge and needle count were correct at the end of the case.  The foley catheter was removed in the OR.  The patient was transferred to the recovery room in good condition.  The patient tolerated the procedure well and it depends on the patient's post op clinical course as to when the patient could  be discharged.   Lindsay Kin, MD, [facility] Pager: [phone] Office phone:  [phone]

## 2015-11-16 NOTE — Anesthesia Procedure Notes (Signed)
Procedure Name: Intubation Date/Time: 11/16/2015 3:18 PM Performed by: Edison PaceGRAY, Ioane Bhola E Pre-anesthesia Checklist: Patient identified, Emergency Drugs available, Timeout performed, Suction available and Patient being monitored Patient Re-evaluated:Patient Re-evaluated prior to inductionOxygen Delivery Method: Circle system utilized Preoxygenation: Pre-oxygenation with 100% oxygen Intubation Type: IV induction, Rapid sequence and Cricoid Pressure applied Laryngoscope Size: Mac and 3 Grade View: Grade II Tube type: Oral Tube size: 7.5 mm Number of attempts: 1 Airway Equipment and Method: Stylet Placement Confirmation: positive ETCO2,  ETT inserted through vocal cords under direct vision and breath sounds checked- equal and bilateral Secured at: 21 cm Tube secured with: Tape Dental Injury: Teeth and Oropharynx as per pre-operative assessment  Difficulty Due To: Difficulty was unanticipated Comments: Anterior. Cords visualized with cricoid/head lift

## 2015-11-16 NOTE — Anesthesia Postprocedure Evaluation (Signed)
Anesthesia Post Note  Patient: Lindsay Nicholson  Procedure(s) Performed: Procedure(s) (LRB): APPENDECTOMY LAPAROSCOPIC (N/A)  Patient location during evaluation: PACU Anesthesia Type: General Level of consciousness: awake and alert Pain management: pain level controlled Vital Signs Assessment: post-procedure vital signs reviewed and stable Respiratory status: spontaneous breathing, nonlabored ventilation, respiratory function stable and patient connected to nasal cannula oxygen Cardiovascular status: blood pressure returned to baseline and stable Postop Assessment: no signs of nausea or vomiting Anesthetic complications: no    Last Vitals:  Filed Vitals:   11/16/15 1147 11/16/15 1615  BP: 108/66 104/62  Pulse: 105 104  Temp:  36.9 C  Resp: 16 16    Last Pain:  Filed Vitals:   11/16/15 1640  PainSc: 5                  Anysa Tacey A

## 2015-11-16 NOTE — ED Notes (Signed)
She states she was awakened by abd. Pain "hurts all over my stomach"; this morning at about 1am".  She also states she had some vag. Spotting yesterday which she thought was the onset of her period, however, she is not having any vag. Spotting or d/c today.

## 2015-11-16 NOTE — ED Notes (Signed)
Abd. U/s just competed; she is asking for pain med., which I will give.

## 2015-11-16 NOTE — ED Notes (Signed)
CHG bath performed; permit signed.  To O.R. At this time.  She remains in no distress.

## 2015-11-17 ENCOUNTER — Encounter: Payer: Self-pay | Admitting: General Surgery

## 2015-11-17 ENCOUNTER — Encounter (HOSPITAL_COMMUNITY): Payer: Self-pay | Admitting: Surgery

## 2015-11-17 MED ORDER — HYDROCODONE-ACETAMINOPHEN 5-325 MG PO TABS: 1.0000 | ORAL_TABLET | ORAL | Status: DC | PRN

## 2015-11-17 MED ORDER — IBUPROFEN 200 MG PO TABS: ORAL_TABLET | ORAL | Status: DC

## 2015-11-17 NOTE — Discharge Instructions (Signed)
[facility] ______[facility] °LAPAROSCOPIC SURGERY: POST OP INSTRUCTIONS °Always review your discharge instruction sheet given to you by the facility where your surgery was performed. °IF YOU HAVE DISABILITY OR FAMILY LEAVE FORMS, YOU MUST BRING THEM TO THE OFFICE FOR PROCESSING.   °DO NOT GIVE THEM TO YOUR DOCTOR. ° °1. A prescription for pain medication may be given to you upon discharge.  Take your pain medication as prescribed, if needed.  If narcotic pain medicine is not needed, then you may take acetaminophen (Tylenol) or ibuprofen (Advil) as needed. °2. Take your usually prescribed medications unless otherwise directed. °3. If you need a refill on your pain medication, please contact your pharmacy.  They will contact our office to request authorization. Prescriptions will not be filled after 5pm or on week-ends. °4. You should follow a light diet the first few days after arrival home, such as soup and crackers, etc.  Be sure to include lots of fluids daily. °5. Most patients will experience some swelling and bruising in the area of the incisions.  Ice packs will help.  Swelling and bruising can take several days to resolve.  °6. It is common to experience some constipation if taking pain medication after surgery.  Increasing fluid intake and taking a stool softener (such as Colace) will usually help or prevent this problem from occurring.  A mild laxative (Milk of Magnesia or Miralax) should be taken according to package instructions if there are no bowel movements after 48 hours. °7. Unless discharge instructions indicate otherwise, you may remove your bandages 24-48 hours after surgery, and you may shower at that time.  You may have steri-strips (small skin tapes) in place directly over the incision.  These strips should be left on the skin for 7-10 days.  If your surgeon used skin glue on the incision, you may shower in 24 hours.  The glue will flake off over the next 2-3 weeks.  Any sutures or  staples will be removed at the office during your follow-up visit. °8. ACTIVITIES:  You may resume regular (light) daily activities beginning the next day--such as daily self-care, walking, climbing stairs--gradually increasing activities as tolerated.  You may have sexual intercourse when it is comfortable.  Refrain from any heavy lifting or straining until approved by your doctor. °a. You may drive when you are no longer taking prescription pain medication, you can comfortably wear a seatbelt, and you can safely maneuver your car and apply brakes. °b. RETURN TO WORK:  __________________________________________________________ °9. You should see your doctor in the office for a follow-up appointment approximately 2-3 weeks after your surgery.  Make sure that you call for this appointment within a day or two after you arrive home to insure a convenient appointment time. °10. OTHER INSTRUCTIONS: __________________________________________________________________________________________________________________________ __________________________________________________________________________________________________________________________ °WHEN TO CALL YOUR DOCTOR: °1. Fever over 101.0 °2. Inability to urinate °3. Continued bleeding from incision. °4. Increased pain, redness, or drainage from the incision. °5. Increasing abdominal pain ° °The clinic staff is available to answer your questions during regular business hours.  Please don’t hesitate to call and ask to speak to one of the nurses for clinical concerns.  If you have a medical emergency, go to the nearest emergency room or call 911.  A surgeon from [facility] is always on call at the hospital. °[address], [address], [place], [place]  [address] ? [address] [address], [place], [place]   [address] °[phone] ? [phone] ? FAX [phone] °Web site:   [email or web address] ° °Laparoscopic Appendectomy, Adult, Care After °Refer to  this sheet in the next few weeks. These instructions provide you with information on caring for yourself after your procedure. Your caregiver may also give you more specific instructions. Your treatment has been planned according to current medical practices, but problems sometimes occur. Call your caregiver if you have any problems or questions after your procedure. °HOME CARE INSTRUCTIONS °· Do not drive while taking narcotic pain medicines. °· Use stool softener if you become constipated from your pain medicines. °· Change your bandages (dressings) as directed. °· Keep your wounds clean and dry. You may wash the wounds gently with soap and water. Gently pat the wounds dry with a clean towel. °· Do not take baths, swim, or use hot tubs for 10 days, or as instructed by your caregiver. °· Only take over-the-counter or prescription medicines for pain, discomfort, or fever as directed by your caregiver. °· You may continue your normal diet as directed. °· Do not lift more than 10 pounds (4.5 kg) or play contact sports for 3 weeks, or as directed. °· Slowly increase your activity after surgery. °· Take deep breaths to avoid getting a lung infection (pneumonia). °SEEK MEDICAL CARE IF: °· You have redness, swelling, or increasing pain in your wounds. °· You have pus coming from your wounds. °· You have drainage from a wound that lasts longer than 1 day. °· You notice a bad smell coming from the wounds or dressing. °· Your wound edges break open after stitches (sutures) have been removed. °· You notice increasing pain in the shoulders (shoulder strap areas) or near your shoulder blades. °· You develop dizzy episodes or fainting while standing. °· You develop shortness of breath. °· You develop persistent nausea or vomiting. °· You cannot control your bowel functions or lose your appetite. °· You develop diarrhea. °SEEK IMMEDIATE MEDICAL CARE IF:  °· You have a fever. °· You develop a rash. °· You have difficulty breathing  or sharp pains in your chest. °· You develop any reaction or side effects to medicines given. °MAKE SURE YOU: °· Understand these instructions. °· Will watch your condition. °· Will get help right away if you are not doing well or get worse. °  °This information is not intended to replace advice given to you by your health care provider. Make sure you discuss any questions you have with your health care provider. °  °Document Released: 08/30/2005 Document Revised: 01/14/2015 Document Reviewed: 02/17/2015 °Elsevier Interactive Patient Education ©2016 [employer] ° ° ° °

## 2015-11-17 NOTE — Progress Notes (Signed)
1 Day Post-Op  Subjective: She looks fine and is ready to go home.    Objective: Vital signs in last 24 hours: Temp:  [97.9 F (36.6 C)-98.4 F (36.9 C)] 98.4 F (36.9 C) (03/06 1000) Pulse Rate:  [76-119] 92 (03/06 1000) Resp:  [11-18] 18 (03/06 1000) BP: (90-115)/(46-71) 99/63 mmHg (03/06 1000) SpO2:  [100 %] 100 % (03/06 1000)   600 Po Regular diet Afebrile, VSS No labs Intake/Output from previous day: 03/05 0701 - 03/06 0700 In: 2446.3 [P.O.:600; I.V.:1696.3; IV Piggyback:150] Out: 300 [Urine:50] Intake/Output this shift: Total I/O In: 340 [P.O.:240; IV Piggyback:100] Out: 0   General appearance: alert, cooperative and no distress GI: sore, sites look fine.  tolerating diet.  Lab Results:   Recent Labs  11/16/15 0832  WBC 20.6*  HGB 12.9  HCT 39.2  PLT 273    BMET  Recent Labs  11/16/15 0832  NA 140  K 3.8  CL 105  CO2 23  GLUCOSE 125*  BUN 13  CREATININE 0.70  CALCIUM 9.8   PT/INR No results for input(s): LABPROT, INR in the last 72 hours.   Recent Labs Lab 11/16/15 0832  AST 20  ALT 15  ALKPHOS 58  BILITOT 0.5  PROT 7.8  ALBUMIN 4.9     Lipase     Component Value Date/Time   LIPASE 20 11/16/2015 [id number]     Studies/Results: Ct Abdomen Pelvis Wo Contrast  11/16/2015  CLINICAL DATA:  Right lower quadrant pain since this morning EXAM: CT ABDOMEN AND PELVIS WITHOUT CONTRAST TECHNIQUE: Multidetector CT imaging of the abdomen and pelvis was performed following the standard protocol without IV contrast. COMPARISON:  Ultrasound from earlier the same day, and previous studies FINDINGS: Lower chest:  No acute findings. Hepatobiliary: No mass visualized on this un-enhanced exam. Pancreas: No mass or inflammatory process identified on this un-enhanced exam. Spleen: Within normal limits in size. Adrenals/Urinary Tract: No evidence of urolithiasis or hydronephrosis. No definite mass visualized on this un-enhanced exam. Stomach/Bowel: Stomach is  physiologically distended. Small bowel is nondilated. Appendix is retrocecal, dilated up to 15 mm diameter, fluid filled, thick-walled, with marked adjacent inflammatory/edematous change. No definite abscess. The colon is nondilated. Vascular/Lymphatic: No pathologically enlarged lymph nodes. No evidence of abdominal aortic aneurysm. Reproductive: No mass or other significant abnormality. Other: No ascites.  No free air. Musculoskeletal:  No suspicious bone lesions identified. IMPRESSION: 1. Acute appendicitis without evidence of abscess. Critical Value/emergent results were called by telephone at the time of interpretation on 11/16/2015 at 12:35 pm to Dr. Rolland PorterMARK JAMES , who verbally acknowledged these results. Electronically Signed   By: Corlis Leak  Hassell M.D.   On: 11/16/2015 12:39   [place] Abdomen Limited Ruq  11/16/2015  CLINICAL DATA:  RIGHT upper quadrant abdominal pain since 0100 hours EXAM: US ABDOMEN LIMITED - RIGHT UPPER QUADRANT COMPARISON:  None FINDINGS: Gallbladder: Normally distended without stones or wall thickening. No pericholecystic fluid or sonographic Murphy sign. Common bile duct: Diameter: Normal caliber 3 mm diameter Liver: Normal appearance No RIGHT upper quadrant free fluid. IMPRESSION: Normal exam. Electronically Signed   By: Ulyses SouthwardMark  Boles M.D.   On: 11/16/2015 09:37    Medications: . cefTRIAXone (ROCEPHIN)  IV  2 g Intravenous Q24H   And  . metronidazole  500 mg Intravenous Q8H  . heparin subcutaneous  5,000 Units Subcutaneous 3 times per day   . dextrose 5 % and 0.45 % NaCl with KCl 20 mEq/L 75 mL/hr at 11/16/15 1843  Prior to Admission medications   Medication Sig Start Date End Date Taking? Authorizing Provider  amitriptyline (ELAVIL) 25 MG tablet TAKE ONE TABLET BY MOUTH AT BEDTIME AS NEEDED FOR SLEEP 11/04/15  Yes Lorre Munroe, NP  etonogestrel (NEXPLANON) 68 MG IMPL implant 1 each (68 mg total) by Subdermal route once. 12/03/14  Yes Lorre Munroe, NP  ibuprofen (ADVIL,MOTRIN)  200 MG tablet Take 200 mg by mouth every 4 (four) hours as needed for moderate pain.    Yes Historical Provider, MD  ondansetron (ZOFRAN) 4 MG tablet Take 1 tablet (4 mg total) by mouth every 8 (eight) hours as needed for nausea or vomiting. Patient not taking: Reported on 11/16/2015 12/11/14   Hannah Beat, MD    Assessment/Plan  Acute Purulent appedicitis S/p laparoscopic appendectomy 11/16/15, Dr. Ezzard Standing Anxiety  Antibiotics:  Pre op DVT:  heparin   Plan:  Home today.      Zakariyya Helfman 11/17/2015

## 2015-11-17 NOTE — Progress Notes (Signed)
11/17/15 1517: Discharge instructions reviewed with patient; questions answered. Prescription and work note also given to pt. Lina SarBeth Jamesrobert Ohanesian, RN

## 2015-11-24 NOTE — Progress Notes (Signed)
Physician Discharge Summary  Patient ID: Lindsay Nicholson MRN: [id number] DOB/AGE: 31/05/1986 31 y.o.  Admit date: 11/16/2015 Discharge date: 11/17/2015  Admission Diagnoses:  Acute appendicitis Anxiety  Discharge Diagnoses:  Acute Purulent appedicitis Anxiety   Active Problems:   Appendicitis   PROCEDURES: S/p laparoscopic appendectomy 11/16/15, Dr. Marty HeckNewman   Hospital Course:  [name] female whose primary care physician is Lindsay ReaperBAITY, REGINA, NP and [facility] for abdominal pain. Her abdominal pain started about 1 AM today. The pain was originally generalized, but has localized to the RLQ. She has had some nausea and vomiting, but that is better with the meds. She has no history of stomach, liver, gall bladder or colon disease. She has had no prior abdominal operation. CT scan - 11/16/2015 - Acute appendicitis without evidence of abscess. WBC - 20,600 - 11/16/2015  She was admitted by Dr. Ezzard StandingNewman and taken to the OR on 11/16/15, she tolerated the procedure well.  She was advanced to a soft diet and was ready to go home the following AM.  She will follow up in the clinic as noted below.    Condition on d/c:  Improved    Disposition: 01-Home or Self Care     Medication List    STOP taking these medications        ondansetron 4 MG tablet  Commonly known as:  ZOFRAN      TAKE these medications        amitriptyline 25 MG tablet  Commonly known as:  ELAVIL  TAKE ONE TABLET BY MOUTH AT BEDTIME AS NEEDED FOR SLEEP     etonogestrel 68 MG Impl implant  Commonly known as:  NEXPLANON  1 each (68 mg total) by Subdermal route once.     HYDROcodone-acetaminophen 5-325 MG tablet  Commonly known as:  NORCO/VICODIN  Take 1-2 tablets by mouth every 4 (four) hours as needed for moderate pain.     ibuprofen 200 MG tablet  Commonly known as:  ADVIL,MOTRIN  You can take 2-3 tablets every 6 hours as needed for pain.  I would use this first and narcotic second.           Follow-up  Information    Follow up with [facility] On 12/10/2015.   Specialty:  General Surgery   Why:  Your appointment is at 8:30 AM, be there 30 minutes early for check in.     Contact information:   [address] [place] [address] [phone]       Signed: Sherrie GeorgeJENNINGS,Zylpha Poynor 11/24/2015, 3:21 PM

## 2015-11-25 NOTE — Discharge Summary (Signed)
Attestation signed by Almond LintFaera Byerly, MD at 11/24/2015 4:53 PM  Agree with above.    Expand All Collapse All   Physician Discharge Summary  Patient ID: Lindsay Nicholson MRN: [id number] DOB/AGE: 31/05/1986 30 y.o.  Admit date: 11/16/2015 Discharge date: 11/17/2015  Admission Diagnoses:  Acute appendicitis Anxiety  Discharge Diagnoses:  Acute Purulent appedicitis Anxiety   Active Problems:  Appendicitis   PROCEDURES: S/p laparoscopic appendectomy 11/16/15, Dr. Marty HeckNewman   Hospital Course:  [name] female whose primary care physician is Nicki ReaperBAITY, REGINA, NP and [facility]. Her abdominal pain started about 1 AM today. The pain was originally generalized, but has localized to the RLQ. She has had some nausea and vomiting, but that is better with the meds. She has no history of stomach, liver, gall bladder or colon disease. She has had no prior abdominal operation. CT scan - 11/16/2015 - Acute appendicitis without evidence of abscess. WBC - 20,600 - 11/16/2015  She was admitted by Dr. Ezzard StandingNewman and taken to the OR on 11/16/15, she tolerated the procedure well. She was advanced to a soft diet and was ready to go home the following AM. She will follow up in the clinic as noted below.   Condition on d/c: Improved   Disposition: 01-Home or Self Care     Medication List    STOP taking these medications       ondansetron 4 MG tablet  Commonly known as: ZOFRAN      TAKE these medications       amitriptyline 25 MG tablet  Commonly known as: ELAVIL  TAKE ONE TABLET BY MOUTH AT BEDTIME AS NEEDED FOR SLEEP     etonogestrel 68 MG Impl implant  Commonly known as: NEXPLANON  1 each (68 mg total) by Subdermal route once.     HYDROcodone-acetaminophen 5-325 MG tablet  Commonly known as: NORCO/VICODIN  Take 1-2 tablets by mouth every 4 (four) hours as needed for moderate pain.     ibuprofen 200 MG tablet  Commonly known as:  ADVIL,MOTRIN  You can take 2-3 tablets every 6 hours as needed for pain. I would use this first and narcotic second.           Follow-up Information    Follow up with [facility] On 12/10/2015.   Specialty: General Surgery   Why: Your appointment is at 8:30 AM, be there 30 minutes early for check in.    Contact information:   [address] [place] [address] [phone]       Signed: Sherrie GeorgeJENNINGS,Aurelio Mccamy 11/24/2015, 3:21 PM         Cosigned by: Almond LintFaera Byerly, MD at 11/24/2015 4:53 PM  Revision History     Date/Time User Provider Type Action   11/24/2015 4:53 PM Almond LintFaera Byerly, MD Physician Cosign   11/24/2015 3:25 PM Sherrie GeorgeWillard Ranya Fiddler, PA-C Physician Assistant Sign

## 2016-01-26 ENCOUNTER — Other Ambulatory Visit: Payer: Self-pay | Admitting: Internal Medicine

## 2016-01-27 NOTE — Telephone Encounter (Signed)
Last filled 11/04/15--please advise if okay for pt to continue

## 2016-04-05 ENCOUNTER — Other Ambulatory Visit: Payer: Self-pay | Admitting: Internal Medicine

## 2016-04-05 DIAGNOSIS — M25512 Pain in left shoulder: Secondary | ICD-10-CM

## 2016-04-09 NOTE — Telephone Encounter (Signed)
Looks like it was last filled 03/2015 for shoulder pain---I will mailing letter letting pt know she is overdue for her CPE--please advise if okay to refill

## 2016-04-09 NOTE — Telephone Encounter (Signed)
Sent electronically 

## 2016-05-21 ENCOUNTER — Ambulatory Visit: Payer: Commercial Managed Care - PPO | Admitting: Internal Medicine

## 2016-05-21 ENCOUNTER — Encounter: Payer: Self-pay | Admitting: Family Medicine

## 2016-05-21 ENCOUNTER — Ambulatory Visit (INDEPENDENT_AMBULATORY_CARE_PROVIDER_SITE_OTHER): Payer: Commercial Managed Care - PPO | Admitting: Family Medicine

## 2016-05-21 DIAGNOSIS — L299 Pruritus, unspecified: Secondary | ICD-10-CM | POA: Diagnosis not present

## 2016-05-21 MED ORDER — PREDNISONE 20 MG PO TABS: ORAL_TABLET | ORAL | 0 refills | Status: DC

## 2016-05-21 NOTE — Progress Notes (Signed)
Pre visit review using our clinic review tool, if applicable. No additional management support is needed unless otherwise documented below in the visit note. 

## 2016-05-21 NOTE — Progress Notes (Signed)
Itching w/o rash.  No trigger known.  Going for about about 1 week.  No FCNAVD.  She doesn't feel unwell.  No sx like this prev.  No cough.  No wheeze.  No new meds. No med allergies.  Itching is worse at night.  No one else with similar troubles.  Worst itching she has had in her life.  Diffuse itching.  No travel, no new foods.  Tried claritin, no help.  Skin can get red locally but transiently, not a persistent rash.    Meds, vitals, and allergies reviewed.   ROS: Per HPI unless specifically indicated in ROS section   GEN: nad, alert and oriented HEENT: mucous membranes moist NECK: supple w/o LA CV: rrr. PULM: ctab, no inc wob ABD: soft, +bs EXT: no edema SKIN: no acute rash

## 2016-05-21 NOTE — Patient Instructions (Signed)
Take pred taper with food.  Update us as needed.   Take care.  Glad to see you.

## 2016-05-23 DIAGNOSIS — L299 Pruritus, unspecified: Secondary | ICD-10-CM | POA: Insufficient documentation

## 2016-05-23 NOTE — Assessment & Plan Note (Signed)
Presumed environmental allergy, but no clear trigger known. No airway symptoms. No tongue swelling, no lip swelling, no stridor. At this point still okay for outpatient follow-up. Would start prednisone taper with routine steroid cautions. We can refer her to allergy clinic if not improved. She agrees. She has no rash typical of scabies. She is not jaundiced.

## 2016-08-02 ENCOUNTER — Encounter: Payer: Commercial Managed Care - PPO | Admitting: Internal Medicine

## 2016-08-03 ENCOUNTER — Encounter: Payer: Self-pay | Admitting: Internal Medicine

## 2016-08-03 ENCOUNTER — Ambulatory Visit (INDEPENDENT_AMBULATORY_CARE_PROVIDER_SITE_OTHER): Payer: Commercial Managed Care - PPO | Admitting: Internal Medicine

## 2016-08-03 VITALS — BP 102/64 | HR 78 | Temp 98.9°F | Ht 62.0 in | Wt 109.0 lb

## 2016-08-03 DIAGNOSIS — R51 Headache: Secondary | ICD-10-CM

## 2016-08-03 DIAGNOSIS — R252 Cramp and spasm: Secondary | ICD-10-CM

## 2016-08-03 DIAGNOSIS — Z0000 Encounter for general adult medical examination without abnormal findings: Secondary | ICD-10-CM | POA: Diagnosis not present

## 2016-08-03 DIAGNOSIS — H6123 Impacted cerumen, bilateral: Secondary | ICD-10-CM

## 2016-08-03 DIAGNOSIS — Z0001 Encounter for general adult medical examination with abnormal findings: Secondary | ICD-10-CM

## 2016-08-03 DIAGNOSIS — H8110 Benign paroxysmal vertigo, unspecified ear: Secondary | ICD-10-CM

## 2016-08-03 DIAGNOSIS — R519 Headache, unspecified: Secondary | ICD-10-CM

## 2016-08-03 LAB — CBC
HEMATOCRIT: 38.7 % (ref 36.0–46.0)
HEMOGLOBIN: 13 g/dL (ref 12.0–15.0)
MCHC: 33.6 g/dL (ref 30.0–36.0)
MCV: 90.4 fl (ref 78.0–100.0)
Platelets: 254 10*3/uL (ref 150.0–400.0)
RBC: 4.28 Mil/uL (ref 3.87–5.11)
RDW: 13 % (ref 11.5–15.5)
WBC: 4.8 10*3/uL (ref 4.0–10.5)

## 2016-08-03 LAB — COMPREHENSIVE METABOLIC PANEL
ALK PHOS: 47 U/L (ref 39–117)
ALT: 13 U/L (ref 0–35)
AST: 16 U/L (ref 0–37)
Albumin: 4.6 g/dL (ref 3.5–5.2)
BUN: 16 mg/dL (ref 6–23)
CHLORIDE: 107 meq/L (ref 96–112)
CO2: 26 meq/L (ref 19–32)
Calcium: 9.5 mg/dL (ref 8.4–10.5)
Creatinine, Ser: 0.7 mg/dL (ref 0.40–1.20)
GFR: 103.66 mL/min (ref >60.00)
GLUCOSE: 91 mg/dL (ref 70–99)
POTASSIUM: 4 meq/L (ref 3.5–5.1)
Sodium: 140 mEq/L (ref 135–145)
TOTAL PROTEIN: 7.2 g/dL (ref 6.0–8.3)
Total Bilirubin: 0.5 mg/dL (ref 0.2–1.2)

## 2016-08-03 LAB — LIPID PANEL
Cholesterol: 146 mg/dL (ref 0–200)
HDL: 48.6 mg/dL (ref >39.00)
LDL CALC: 77 mg/dL (ref 0–99)
NONHDL: 97.28
Total CHOL/HDL Ratio: 3
Triglycerides: 103 mg/dL (ref 0.0–149.0)
VLDL: 20.6 mg/dL (ref 0.0–40.0)

## 2016-08-03 MED ORDER — TOPIRAMATE 25 MG PO CPSP: 25.0000 mg | ORAL_CAPSULE | Freq: Every day | ORAL | 1 refills | Status: DC

## 2016-08-03 MED ORDER — MECLIZINE HCL 25 MG PO TABS: 25.0000 mg | ORAL_TABLET | Freq: Three times a day (TID) | ORAL | 0 refills | Status: DC | PRN

## 2016-08-03 NOTE — Patient Instructions (Signed)

## 2016-08-03 NOTE — Progress Notes (Signed)
Subjective:    Patient ID: Lindsay Nicholson, female    DOB: [birth year], 30 y.o.   MRN: [id number]  HPI  Pt presents to the clinic today for her annual exam.   Flu: 06/2015 Tetanus: 2012 Pap Smear: 10/2014 Dentist: annually  Diet: She does eat meats. She consumes more veggies than fruits. She does eat some fried foods. She drinks mostly coffee. Exercise: She walks 30 minutes, daily.  She is concerned about dizziness. She reports this started 2 weeks ago. It is associated with nasal congestion and head movements. She describes it as a sensation that the room in spinning. She has nausea but denies vomiting. She denies nasal congestion, ear pain, sore throat or cough. She denies fever, chills or body aches. She reports she does drink some water. She denies recent changes in diet, medication or activity.  She also reports hand cramping. This occurred 1 week ago. She reports her left hand seems worse than her right. She reports her left hand cramped up and it radiated all the way up her arm into her neck. It resolved on its own, but she reports her hands and left arm have been sore since that time. She has taken Aspirin with minimal relief.  Review of Systems      Past Medical History:  Diagnosis Date  . Anxiety   . SVD (spontaneous vaginal delivery) 09/14/2011    Current Outpatient Prescriptions  Medication Sig Dispense Refill  . etonogestrel (NEXPLANON) 68 MG IMPL implant 1 each (68 mg total) by Subdermal route once. 1 each 0  . amitriptyline (ELAVIL) 25 MG tablet TAKE ONE TABLET BY MOUTH AT BEDTIME AS NEEDED FOR SLEEP (Patient not taking: Reported on 08/03/2016) 30 tablet 0  . meloxicam (MOBIC) 15 MG tablet TAKE ONE TABLET BY MOUTH ONCE DAILY (Patient not taking: Reported on 08/03/2016) 30 tablet 0   No current facility-administered medications for this visit.     No Known Allergies  Family History  Problem Relation Age of Onset  . Diabetes Father   . Hypertension Father   .  Anesthesia problems Neg Hx   . Cancer Neg Hx   . Heart disease Neg Hx   . Stroke Neg Hx     Social History   Social History  . Marital status: Single    Spouse name: N/A  . Number of children: N/A  . Years of education: N/A   Occupational History  . Not on file.   Social History Main Topics  . Smoking status: Current Every Day Smoker    Packs/day: 0.25  . Smokeless tobacco: Never Used  . Alcohol use 0.0 oz/week     Comment: occasional  . Drug use: No  . Sexual activity: Yes   Other Topics Concern  . Not on file   Social History Narrative  . No narrative on file     Constitutional: Denies fever, malaise, fatigue, headache or abrupt weight changes.  HEENT: Pt reports runny nose. Denies eye pain, eye redness, ear pain, ringing in the ears, wax buildup, nasal congestion, bloody nose, or sore throat. Respiratory: Denies difficulty breathing, shortness of breath, cough or sputum production.   Cardiovascular: Denies chest pain, chest tightness, palpitations or swelling in the hands or feet.  Gastrointestinal: Pt reports nausea. Denies abdominal pain, bloating, constipation, diarrhea or blood in the stool.  GU: Denies urgency, frequency, pain with urination, burning sensation, blood in urine, odor or discharge. Musculoskeletal: Pt reports bilateral hand cramping and left arm pain. Denies decrease  in range of motion, difficulty with gait, muscle pain or joint swelling.  Skin: Denies redness, rashes, lesions or ulcercations.  Neurological: Pt reports dizziness. Denies difficulty with memory, difficulty with speech or problems with balance and coordination.  Psych: Denies anxiety, depression, SI/HI.  No other specific complaints in a complete review of systems (except as listed in HPI above).  Objective:   Physical Exam   BP 102/64   Pulse 78   Temp 98.9 F (37.2 C) (Oral)   Ht 5\' 2"  (1.575 m)   Wt 109 lb (49.4 kg)   LMP 07/15/2016   SpO2 99%   BMI 19.94 kg/m  Wt  Readings from Last 3 Encounters:  08/03/16 109 lb (49.4 kg)  05/21/16 111 lb 12 oz (50.7 kg)  10/02/15 109 lb (49.4 kg)    General: Appears her stated age, well developed, well nourished in NAD. Skin: Warm, dry and intact. HEENT: Head: normal shape and size; Eyes: sclera white, no icterus, conjunctiva pink, PERRLA and EOMs intact, no nystagmus; Ears: bilateral cerumen impaction;  Throat/Mouth: Teeth present, mucosa pink and moist, no exudate, lesions or ulcerations noted.  Neck:  Neck supple, trachea midline. No masses, lumps or thyromegaly present.  Cardiovascular: Normal rate and rhythm. S1,S2 noted.  No murmur, rubs or gallops noted. No JVD or BLE edema. No carotid bruits noted. Pulmonary/Chest: Normal effort and positive vesicular breath sounds. No respiratory distress. No wheezes, rales or ronchi noted.  Abdomen: Soft and nontender. Normal bowel sounds. No distention or masses noted. Liver, spleen and kidneys non palpable. Musculoskeletal: Normal range of motion. Strength 5/5 BUE/BLE. No difficulty with gait.  Neurological: Alert and oriented. Cranial nerves II-XII grossly intact. Coordination normal.  Psychiatric: Mood and affect normal. Behavior is normal. Judgment and thought content normal.     BMET    Component Value Date/Time   NA 140 11/16/2015 0832   K 3.8 11/16/2015 0832   CL 105 11/16/2015 0832   CO2 23 11/16/2015 0832   GLUCOSE 125 (H) 11/16/2015 0832   BUN 13 11/16/2015 0832   CREATININE 0.70 11/16/2015 0832   CALCIUM 9.8 11/16/2015 0832   GFRNONAA >60 11/16/2015 0832   GFRAA >60 11/16/2015 0832    Lipid Panel     Component Value Date/Time   CHOL 137 12/03/2014 0957   TRIG 140.0 12/03/2014 0957   HDL 44.00 12/03/2014 0957   CHOLHDL 3 12/03/2014 0957   VLDL 28.0 12/03/2014 0957   LDLCALC 65 12/03/2014 0957    CBC    Component Value Date/Time   WBC 20.6 (H) 11/16/2015 0832   RBC 4.23 11/16/2015 0832   HGB 12.9 11/16/2015 0832   HCT 39.2 11/16/2015  0832   PLT 273 11/16/2015 0832   MCV 92.7 11/16/2015 0832   MCH 30.5 11/16/2015 0832   MCHC 32.9 11/16/2015 0832   RDW 12.8 11/16/2015 0832   LYMPHSABS 0.9 11/16/2015 0832   MONOABS 0.8 11/16/2015 0832   EOSABS 0.0 11/16/2015 0832   BASOSABS 0.0 11/16/2015 0832    Hgb A1C No results found for: HGBA1C         Assessment & Plan:   Preventative Health Maintenance:  Flu shot today Tetanus UTD Pap smear UTD Encouraged her to consume a balanced diet and exercise regimen Advised her to see a dentist annually Will check CBC, CMET, Lipid profile today  Bilateral cerumen impaction:  Manual lavage by CMA Discussed using Debrox 2 x week to prevent buildup  BPPV:  Discussed increasing consumption of water  eRx for Meclizine 25 mg TID prn  Frequent Headaches:  Failed Fioricet and Amitriptyline eRx for Topamax 25 mg daily Follow up with me in 4 weeks via mychart and let me know how you are doing  Hand cramping:  Exam benign Will check CMET today  RTC in 1 year, sooner if needed Nicki ReaperBAITY, Toa Mia, NP

## 2016-08-04 ENCOUNTER — Encounter: Payer: Commercial Managed Care - PPO | Admitting: Internal Medicine

## 2016-09-15 ENCOUNTER — Other Ambulatory Visit: Payer: Self-pay | Admitting: Internal Medicine

## 2016-09-15 DIAGNOSIS — M25512 Pain in left shoulder: Secondary | ICD-10-CM

## 2017-01-27 IMAGING — CT CT ABD-PELV W/O CM
2 of 4 series · 17 of 46 positions shown, 19 images · non-contrast
Comparison: Ultrasound from earlier the same day, and previous
studies

CLINICAL DATA: Right lower quadrant pain since this morning

EXAM:
CT ABDOMEN AND PELVIS WITHOUT CONTRAST
TECHNIQUE: Multidetector CT imaging of the abdomen and pelvis was performed
following the standard protocol without IV contrast.

[Series 2: abd/pel w/o · axial · non-contrast · 0.74mm/px · z∈[-528,-132]mm · 14 of 87 slices shown, 16 images]
[im 4/87  soft-tissue]
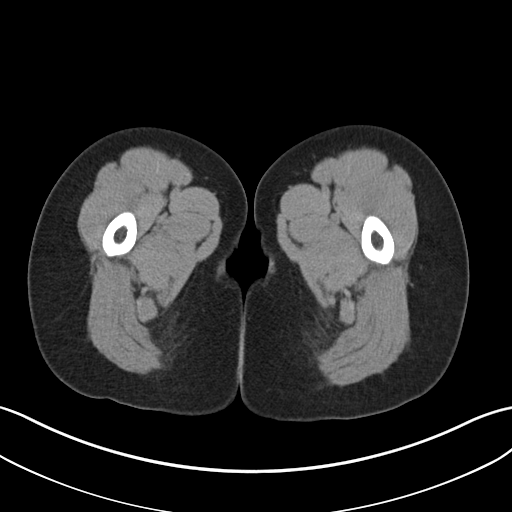
[im 4/87  bone]
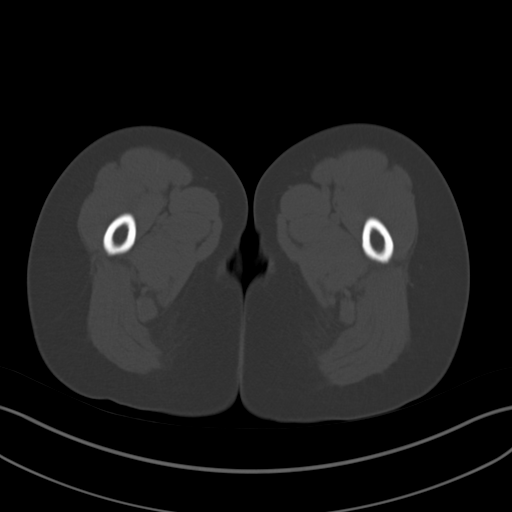
[im 11/87  soft-tissue]
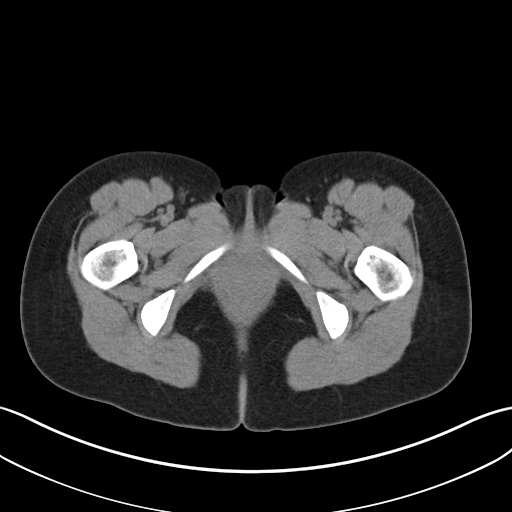
[im 18/87  soft-tissue]
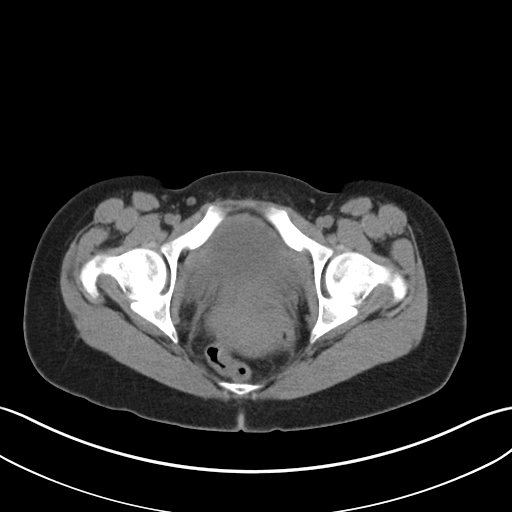
[im 25/87  soft-tissue]
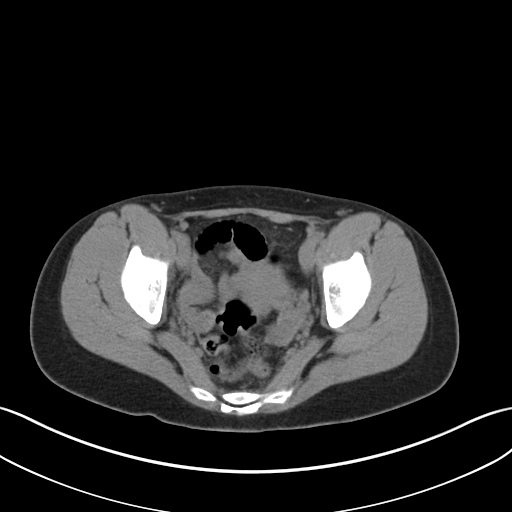
[im 28/87  soft-tissue]
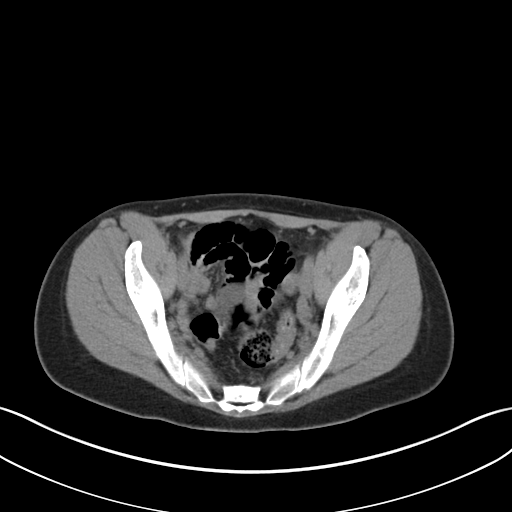
[im 35/87  soft-tissue]
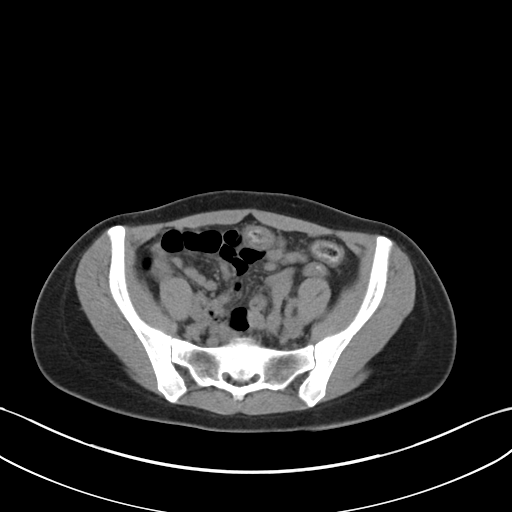
[im 42/87  soft-tissue]
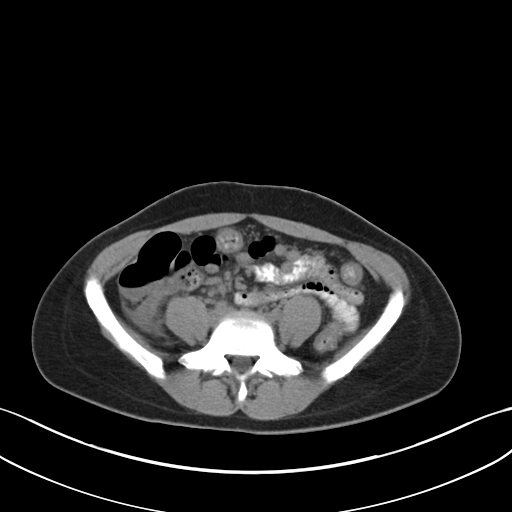
[im 45/87  soft-tissue]
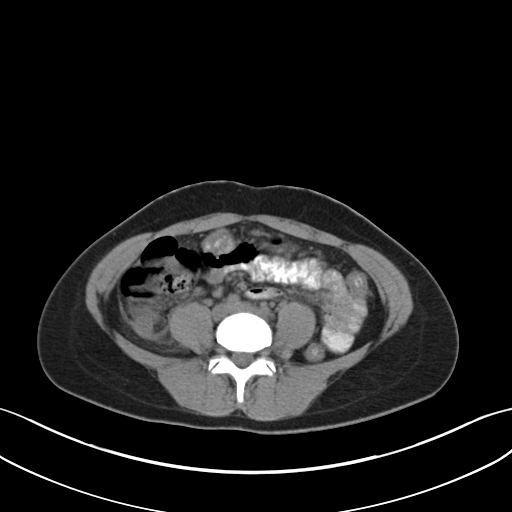
[im 52/87  soft-tissue]
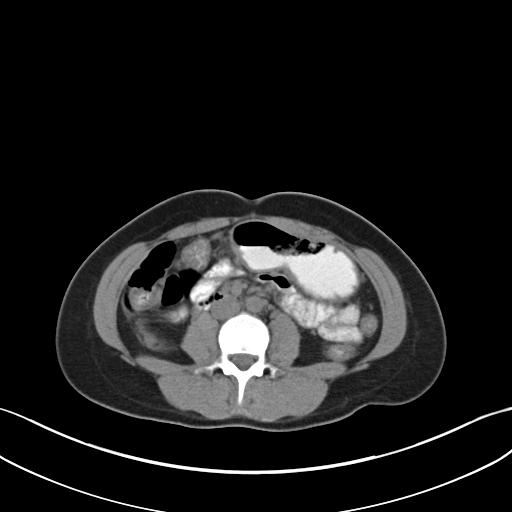
[im 52/87  bone]
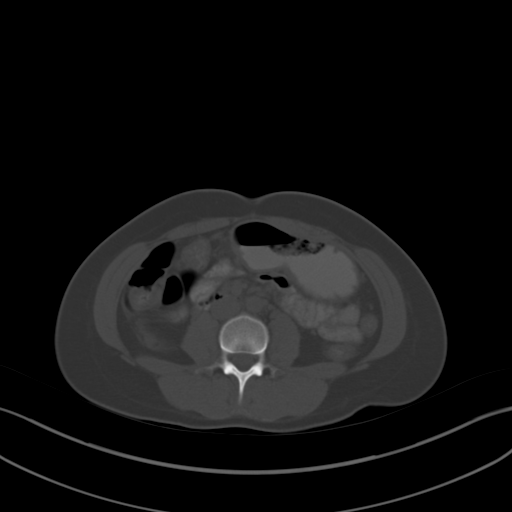
[im 59/87  soft-tissue]
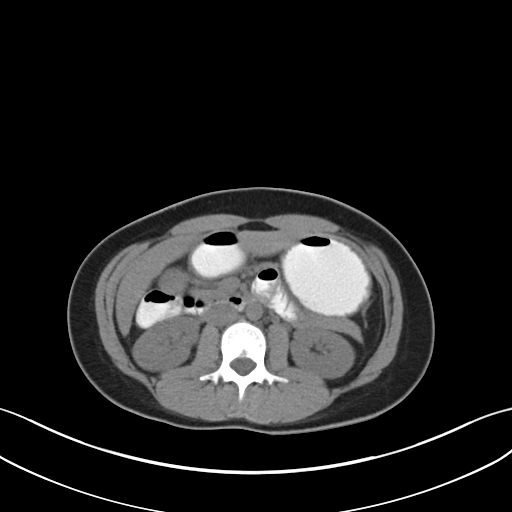
[im 66/87  soft-tissue]
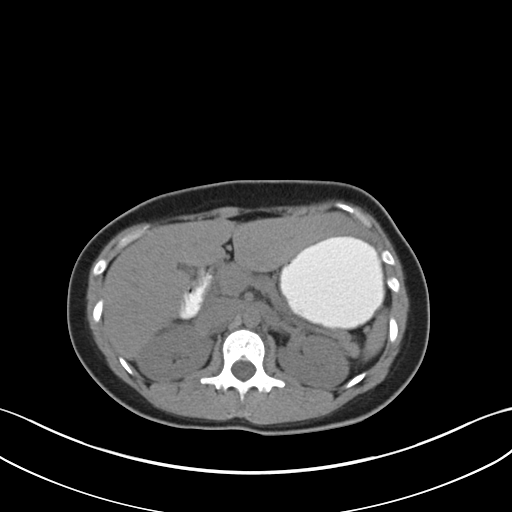
[im 69/87  soft-tissue]
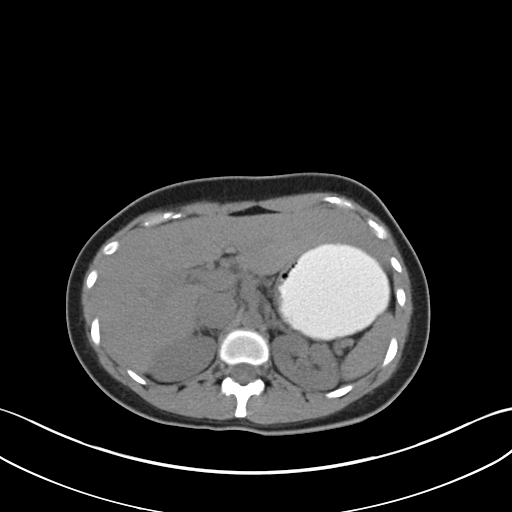
[im 76/87  soft-tissue]
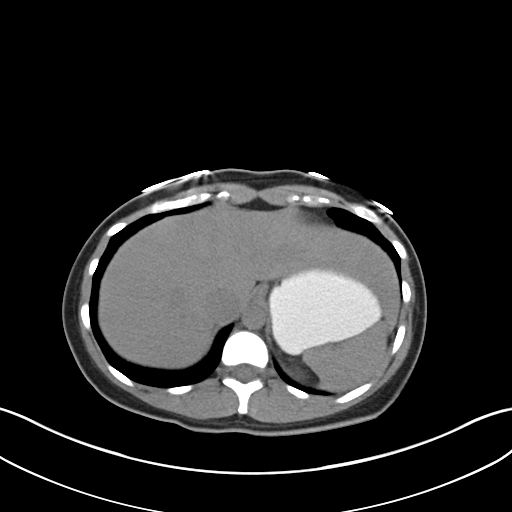
[im 83/87  soft-tissue]
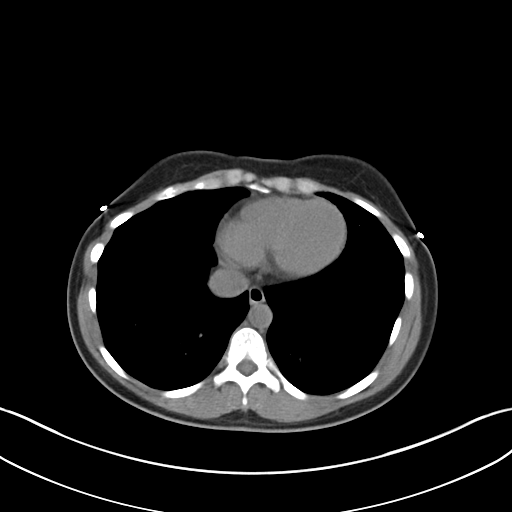

[Series 3: coronal · coronal · 0.88mm/px · 3 of 94 slices shown]
[im 32/94  soft-tissue]
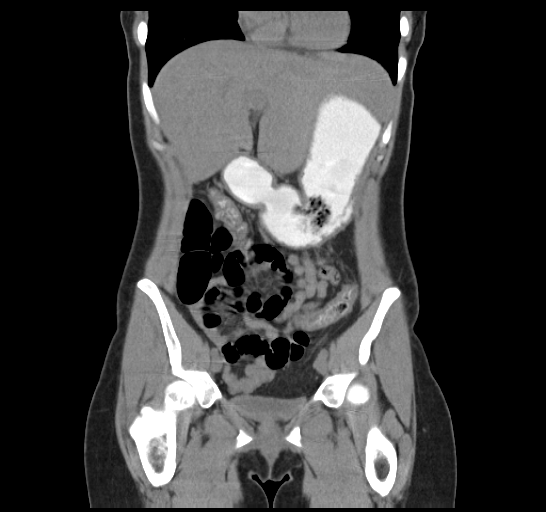
[im 42/94  soft-tissue]
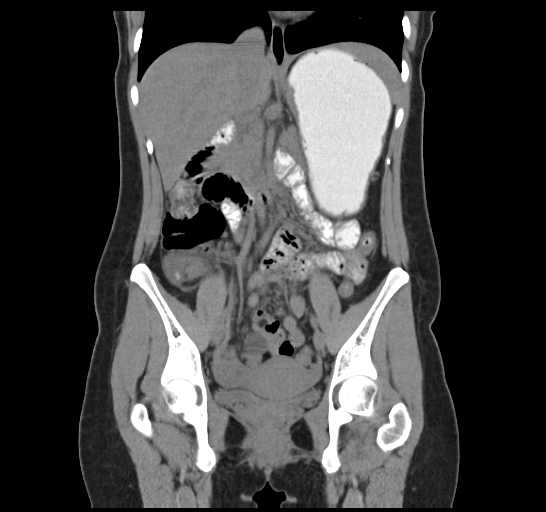
[im 52/94  soft-tissue]
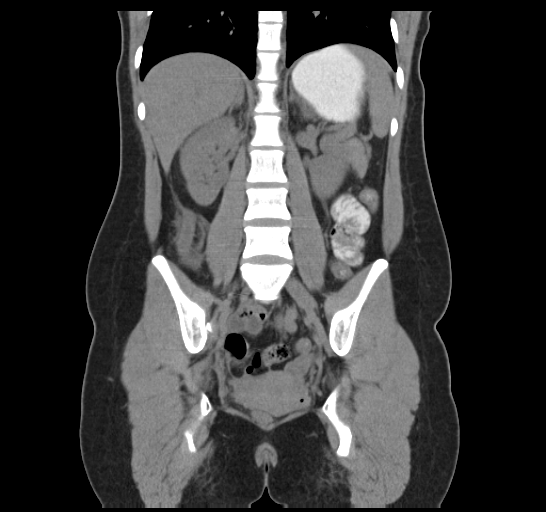

[17 of 46 positions shown; findings below may reference images not displayed]

FINDINGS: Lower chest:  No acute findings.

Hepatobiliary: No mass visualized on this un-enhanced exam.

Pancreas: No mass or inflammatory process identified on this
un-enhanced exam.

Spleen: Within normal limits in size.

Adrenals/Urinary Tract: No evidence of urolithiasis or
hydronephrosis. No definite mass visualized on this un-enhanced
exam.

Stomach/Bowel: Stomach is physiologically distended. Small bowel is
nondilated. Appendix is retrocecal, dilated up to 15 mm diameter,
fluid filled, thick-walled, with marked adjacent
inflammatory/edematous change. No definite abscess. The colon is
nondilated.

Vascular/Lymphatic: No pathologically enlarged lymph nodes. No
evidence of abdominal aortic aneurysm.

Reproductive: No mass or other significant abnormality.

Other: No ascites.  No free air.

Musculoskeletal:  No suspicious bone lesions identified.
IMPRESSION: 1. Acute appendicitis without evidence of abscess.
Critical Value/emergent results were called by telephone at the time
of interpretation on 11/16/2015 at [DATE] to Dr. RUDI JUMPER , who
verbally acknowledged these results.

## 2017-04-08 ENCOUNTER — Other Ambulatory Visit: Payer: Self-pay | Admitting: Internal Medicine

## 2017-04-08 DIAGNOSIS — M25512 Pain in left shoulder: Secondary | ICD-10-CM

## 2017-04-08 NOTE — Telephone Encounter (Signed)
Last filled 09/15/16... Please advise

## 2017-07-25 ENCOUNTER — Ambulatory Visit (INDEPENDENT_AMBULATORY_CARE_PROVIDER_SITE_OTHER): Payer: Commercial Managed Care - PPO | Admitting: Internal Medicine

## 2017-07-25 ENCOUNTER — Encounter: Payer: Self-pay | Admitting: Internal Medicine

## 2017-07-25 VITALS — BP 102/66 | HR 85 | Temp 98.1°F | Wt 113.0 lb

## 2017-07-25 DIAGNOSIS — J069 Acute upper respiratory infection, unspecified: Secondary | ICD-10-CM | POA: Diagnosis not present

## 2017-07-25 DIAGNOSIS — R52 Pain, unspecified: Secondary | ICD-10-CM | POA: Diagnosis not present

## 2017-07-25 DIAGNOSIS — R6883 Chills (without fever): Secondary | ICD-10-CM | POA: Diagnosis not present

## 2017-07-25 LAB — POC INFLUENZA A&B (BINAX/QUICKVUE)
INFLUENZA A, POC: NEGATIVE
INFLUENZA B, POC: NEGATIVE

## 2017-07-25 NOTE — Progress Notes (Signed)
Subjective:    Patient ID: Lindsay Nicholson, female    DOB: 11/15/1985, 31 y.o.   MRN: [id number]  HPI  Pt presents to the clinic today with c/o headache, nasal congestion, sore throat and body aches. She reports she woke up this morning feeling this way. She describes the headache as pressure. She denies visual changes or dizziness. She is not able to blow anything out of her nose. She denies difficulty swallowing. She denies fever, but has had chills. She has not taken anything OTC for her symptoms. She has had sick contacts. She has not had her flu shot.   Review of Systems      Past Medical History:  Diagnosis Date  . Anxiety   . SVD (spontaneous vaginal delivery) 09/14/2011    Current Outpatient Medications  Medication Sig Dispense Refill  . etonogestrel (NEXPLANON) 68 MG IMPL implant 1 each (68 mg total) by Subdermal route once. 1 each 0  . meclizine (ANTIVERT) 25 MG tablet Take 1 tablet (25 mg total) by mouth 3 (three) times daily as needed for dizziness. 30 tablet 0  . meloxicam (MOBIC) 15 MG tablet TAKE ONE TABLET BY MOUTH ONCE DAILY 90 tablet 0  . topiramate (TOPAMAX) 25 MG capsule Take 1 capsule (25 mg total) by mouth daily. (Patient taking differently: Take 50 mg daily by mouth. ) 30 capsule 1   No current facility-administered medications for this visit.     No Known Allergies  Family History  Problem Relation Age of Onset  . Diabetes Father   . Hypertension Father   . Anesthesia problems Neg Hx   . Cancer Neg Hx   . Heart disease Neg Hx   . Stroke Neg Hx     Social History   Socioeconomic History  . Marital status: Single    Spouse name: Not on file  . Number of children: Not on file  . Years of education: Not on file  . Highest education level: Not on file  Social Needs  . Financial resource strain: Not on file  . Food insecurity - worry: Not on file  . Food insecurity - inability: Not on file  . Transportation needs - medical: Not on file  .  Transportation needs - non-medical: Not on file  Occupational History  . Not on file  Tobacco Use  . Smoking status: Current Every Day Smoker    Packs/day: 0.25  . Smokeless tobacco: Never Used  Substance and Sexual Activity  . Alcohol use: Yes    Alcohol/week: 0.0 oz    Comment: occasional  . Drug use: No  . Sexual activity: Yes  Other Topics Concern  . Not on file  Social History Narrative  . Not on file     Constitutional: Pt reports fatigue, headache. Denies fever, malaise, or abrupt weight changes.  HEENT: Pt reports nasal congestion, sore throat. Denies eye pain, eye redness, ear pain, ringing in the ears, wax buildup, runny nose, bloody nose. Respiratory: Denies difficulty breathing, shortness of breath, cough or sputum production.    No other specific complaints in a complete review of systems (except as listed in HPI above).  Objective:   Physical Exam  BP 102/66   Pulse 85   Temp 98.1 F (36.7 C) (Oral)   Wt 113 lb (51.3 kg)   LMP 06/07/2017   SpO2 98%   BMI 20.67 kg/m  Wt Readings from Last 3 Encounters:  07/25/17 113 lb (51.3 kg)  08/03/16 109 lb (49.4 kg)  05/21/16 111 lb 12 oz (50.7 kg)    General: Appears her stated age, ill appearing, in NAD. HEENT: Head: normal shape and size, no sinus tenderness noted; Ears: bilateral cerumen impaction; Nose: mucosa pink and moist, sepm midline; Throat/Mouth: Teeth present, mucosa erythematous and moist, no exudate, lesions or ulcerations noted.  Neck:  Bilateral anterior lymphadenopathy noted.  Cardiovascular: Normal rate and rhythm. S1,S2 noted.  No murmur, rubs or gallops noted.  Pulmonary/Chest: Normal effort and positive vesicular breath sounds. No respiratory distress. No wheezes, rales or ronchi noted.   BMET    Component Value Date/Time   NA 140 08/03/2016 1208   K 4.0 08/03/2016 1208   CL 107 08/03/2016 1208   CO2 26 08/03/2016 1208   GLUCOSE 91 08/03/2016 1208   BUN 16 08/03/2016 1208   CREATININE  0.70 08/03/2016 1208   CALCIUM 9.5 08/03/2016 1208   GFRNONAA >60 11/16/2015 0832   GFRAA >60 11/16/2015 0832    Lipid Panel     Component Value Date/Time   CHOL 146 08/03/2016 1208   TRIG 103.0 08/03/2016 1208   HDL 48.60 08/03/2016 1208   CHOLHDL 3 08/03/2016 1208   VLDL 20.6 08/03/2016 1208   LDLCALC 77 08/03/2016 1208    CBC    Component Value Date/Time   WBC 4.8 08/03/2016 1208   RBC 4.28 08/03/2016 1208   HGB 13.0 08/03/2016 1208   HCT 38.7 08/03/2016 1208   PLT 254.0 08/03/2016 1208   MCV 90.4 08/03/2016 1208   MCH 30.5 11/16/2015 0832   MCHC 33.6 08/03/2016 1208   RDW 13.0 08/03/2016 1208   LYMPHSABS 0.9 11/16/2015 0832   MONOABS 0.8 11/16/2015 0832   EOSABS 0.0 11/16/2015 0832   BASOSABS 0.0 11/16/2015 0832    Hgb A1C No results found for: HGBA1C          Assessment & Plan:   Viral URI:  Rapid Flu: negative Advised her to get some rest and drink plenty of water Ibuprofen for body aches Flonase for nasal congestion Work note provided  Return precautions discussed Nicki ReaperBAITY, Demontrez Rindfleisch, NP

## 2017-07-25 NOTE — Addendum Note (Signed)
Addended by: Roena MaladyEVONTENNO, Mckaylie Vasey Y on: 07/25/2017 04:39 PM   Modules accepted: Orders

## 2017-07-25 NOTE — Patient Instructions (Signed)
Viral Illness, Adult Viruses are tiny germs that can get into a person's body and cause illness. There are many different types of viruses, and they cause many types of illness. Viral illnesses can range from mild to severe. They can affect various parts of the body. Common illnesses that are caused by a virus include colds and the flu. Viral illnesses also include serious conditions such as HIV/AIDS (human immunodeficiency virus/acquired immunodeficiency syndrome). A few viruses have been linked to certain cancers. What are the causes? Many types of viruses can cause illness. Viruses invade cells in your body, multiply, and cause the infected cells to malfunction or die. When the cell dies, it releases more of the virus. When this happens, you develop symptoms of the illness, and the virus continues to spread to other cells. If the virus takes over the function of the cell, it can cause the cell to divide and grow out of control, as is the case when a virus causes cancer. Different viruses get into the body in different ways. You can get a virus by:  Swallowing food or water that is contaminated with the virus.  Breathing in droplets that have been coughed or sneezed into the air by an infected person.  Touching a surface that has been contaminated with the virus and then touching your eyes, nose, or mouth.  Being bitten by an insect or animal that carries the virus.  Having sexual contact with a person who is infected with the virus.  Being exposed to blood or fluids that contain the virus, either through an open cut or during a transfusion.  If a virus enters your body, your body's defense system (immune system) will try to fight the virus. You may be at higher risk for a viral illness if your immune system is weak. What are the signs or symptoms? Symptoms vary depending on the type of virus and the location of the cells that it invades. Common symptoms of the main types of viral illnesses  include: Cold and flu viruses  Fever.  Headache.  Sore throat.  Muscle aches.  Nasal congestion.  Cough. Digestive system (gastrointestinal) viruses  Fever.  Abdominal pain.  Nausea.  Diarrhea. Liver viruses (hepatitis)  Loss of appetite.  Tiredness.  Yellowing of the skin (jaundice). Brain and spinal cord viruses  Fever.  Headache.  Stiff neck.  Nausea and vomiting.  Confusion or sleepiness. Skin viruses  Warts.  Itching.  Rash. Sexually transmitted viruses  Discharge.  Swelling.  Redness.  Rash. How is this treated? Viruses can be difficult to treat because they live within cells. Antibiotic medicines do not treat viruses because these drugs do not get inside cells. Treatment for a viral illness may include:  Resting and drinking plenty of fluids.  Medicines to relieve symptoms. These can include over-the-counter medicine for pain and fever, medicines for cough or congestion, and medicines to relieve diarrhea.  Antiviral medicines. These drugs are available only for certain types of viruses. They may help reduce flu symptoms if taken early. There are also many antiviral medicines for hepatitis and HIV/AIDS.  Some viral illnesses can be prevented with vaccinations. A common example is the flu shot. Follow these instructions at home: Medicines   Take over-the-counter and prescription medicines only as told by your health care provider.  If you were prescribed an antiviral medicine, take it as told by your health care provider. Do not stop taking the medicine even if you start to feel better.  Be aware   of when antibiotics are needed and when they are not needed. Antibiotics do not treat viruses. If your health care provider thinks that you may have a bacterial infection as well as a viral infection, you may get an antibiotic. ? Do not ask for an antibiotic prescription if you have been diagnosed with a viral illness. That will not make your  illness go away faster. ? Frequently taking antibiotics when they are not needed can lead to antibiotic resistance. When this develops, the medicine no longer works against the bacteria that it normally fights. General instructions  Drink enough fluids to keep your urine clear or pale yellow.  Rest as much as possible.  Return to your normal activities as told by your health care provider. Ask your health care provider what activities are safe for you.  Keep all follow-up visits as told by your health care provider. This is important. How is this prevented? Take these actions to reduce your risk of viral infection:  Eat a healthy diet and get enough rest.  Wash your hands often with soap and water. This is especially important when you are in public places. If soap and water are not available, use hand sanitizer.  Avoid close contact with friends and family who have a viral illness.  If you travel to areas where viral gastrointestinal infection is common, avoid drinking water or eating raw food.  Keep your immunizations up to date. Get a flu shot every year as told by your health care provider.  Do not share toothbrushes, nail clippers, razors, or needles with other people.  Always practice safe sex.  Contact a health care provider if:  You have symptoms of a viral illness that do not go away.  Your symptoms come back after going away.  Your symptoms get worse. Get help right away if:  You have trouble breathing.  You have a severe headache or a stiff neck.  You have severe vomiting or abdominal pain. This information is not intended to replace advice given to you by your health care provider. Make sure you discuss any questions you have with your health care provider. Document Released: 01/09/2016 Document Revised: 02/11/2016 Document Reviewed: 01/09/2016 Elsevier Interactive Patient Education  2018 Elsevier Inc.  

## 2017-08-08 ENCOUNTER — Ambulatory Visit (INDEPENDENT_AMBULATORY_CARE_PROVIDER_SITE_OTHER): Payer: Commercial Managed Care - PPO | Admitting: Internal Medicine

## 2017-08-08 ENCOUNTER — Encounter: Payer: Self-pay | Admitting: Internal Medicine

## 2017-08-08 VITALS — BP 96/52 | HR 65 | Temp 98.2°F | Ht 61.25 in | Wt 112.2 lb

## 2017-08-08 DIAGNOSIS — H6123 Impacted cerumen, bilateral: Secondary | ICD-10-CM

## 2017-08-08 DIAGNOSIS — Z0001 Encounter for general adult medical examination with abnormal findings: Secondary | ICD-10-CM | POA: Diagnosis not present

## 2017-08-08 DIAGNOSIS — Z13220 Encounter for screening for lipoid disorders: Secondary | ICD-10-CM

## 2017-08-08 DIAGNOSIS — R51 Headache: Secondary | ICD-10-CM

## 2017-08-08 DIAGNOSIS — F411 Generalized anxiety disorder: Secondary | ICD-10-CM | POA: Diagnosis not present

## 2017-08-08 DIAGNOSIS — R519 Headache, unspecified: Secondary | ICD-10-CM

## 2017-08-08 LAB — COMPREHENSIVE METABOLIC PANEL
ALBUMIN: 4.7 g/dL (ref 3.5–5.2)
ALT: 16 U/L (ref 0–35)
AST: 17 U/L (ref 0–37)
Alkaline Phosphatase: 45 U/L (ref 39–117)
BILIRUBIN TOTAL: 0.4 mg/dL (ref 0.2–1.2)
BUN: 13 mg/dL (ref 6–23)
CALCIUM: 9.6 mg/dL (ref 8.4–10.5)
CHLORIDE: 104 meq/L (ref 96–112)
CO2: 24 mEq/L (ref 19–32)
CREATININE: 0.67 mg/dL (ref 0.40–1.20)
GFR: 108.32 mL/min (ref >60.00)
Glucose, Bld: 95 mg/dL (ref 70–99)
Potassium: 3.5 mEq/L (ref 3.5–5.1)
Sodium: 138 mEq/L (ref 135–145)
Total Protein: 7.6 g/dL (ref 6.0–8.3)

## 2017-08-08 LAB — CBC
HCT: 39.9 % (ref 36.0–46.0)
HEMOGLOBIN: 13.1 g/dL (ref 12.0–15.0)
MCHC: 32.7 g/dL (ref 30.0–36.0)
MCV: 95.5 fl (ref 78.0–100.0)
PLATELETS: 295 10*3/uL (ref 150.0–400.0)
RBC: 4.18 Mil/uL (ref 3.87–5.11)
RDW: 13.4 % (ref 11.5–15.5)
WBC: 8.9 10*3/uL (ref 4.0–10.5)

## 2017-08-08 MED ORDER — HYDROXYZINE HCL 10 MG PO TABS
10.0000 mg | ORAL_TABLET | Freq: Every day | ORAL | 0 refills | Status: DC | PRN
Start: 2017-08-08 — End: 2019-03-15

## 2017-08-08 NOTE — Progress Notes (Signed)
Subjective:    Patient ID: Lindsay Nicholson, female    DOB: Aug 27, 1986, 31 y.o.   MRN: [id number]  HPI  Pt presents to the clinic today for her annual exam. She is also due to follow up chronic conditions.  Anxiety: Triggered by general life stress. She does feel like this is getting worse but she is not interested in taking something daily. She has great support from her family. She denies depression, SI/HI.  Frequent Headaches: She reports these aren't as bad since she started the Topamax. She reports she does not take it daily, because it makes her "feel weird".   Flu: never Tetanus: unsure Pap Smear: < 3 years ago, [facility] Dentist: biannually  Diet: She does eat meat. She consumes some fruits and veggies. She does eat fried foods. She drinks mostly coffee. Exercise: None  Review of Systems      Past Medical History:  Diagnosis Date  . Anxiety   . SVD (spontaneous vaginal delivery) 09/14/2011    Current Outpatient Medications  Medication Sig Dispense Refill  . etonogestrel (NEXPLANON) 68 MG IMPL implant 1 each (68 mg total) by Subdermal route once. 1 each 0  . meclizine (ANTIVERT) 25 MG tablet Take 1 tablet (25 mg total) by mouth 3 (three) times daily as needed for dizziness. 30 tablet 0  . meloxicam (MOBIC) 15 MG tablet TAKE ONE TABLET BY MOUTH ONCE DAILY 90 tablet 0  . topiramate (TOPAMAX) 25 MG capsule Take 1 capsule (25 mg total) by mouth daily. (Patient taking differently: Take 50 mg daily by mouth. ) 30 capsule 1   No current facility-administered medications for this visit.     No Known Allergies  Family History  Problem Relation Age of Onset  . Diabetes Father   . Hypertension Father   . Anesthesia problems Neg Hx   . Cancer Neg Hx   . Heart disease Neg Hx   . Stroke Neg Hx     Social History   Socioeconomic History  . Marital status: Single    Spouse name: Not on file  . Number of children: Not on file  . Years of education: Not on file  .  Highest education level: Not on file  Social Needs  . Financial resource strain: Not on file  . Food insecurity - worry: Not on file  . Food insecurity - inability: Not on file  . Transportation needs - medical: Not on file  . Transportation needs - non-medical: Not on file  Occupational History  . Not on file  Tobacco Use  . Smoking status: Current Every Day Smoker    Packs/day: 0.25  . Smokeless tobacco: Never Used  Substance and Sexual Activity  . Alcohol use: Yes    Alcohol/week: 0.0 oz    Comment: occasional  . Drug use: No  . Sexual activity: Yes  Other Topics Concern  . Not on file  Social History Narrative  . Not on file     Constitutional: Pt reports headaches. Denies fever, malaise, fatigue, headache or abrupt weight changes.  HEENT: Denies eye pain, eye redness, ear pain, ringing in the ears, wax buildup, runny nose, nasal congestion, bloody nose, or sore throat. Respiratory: Denies difficulty breathing, shortness of breath, cough or sputum production.   Cardiovascular: Denies chest pain, chest tightness, palpitations or swelling in the hands or feet.  Gastrointestinal: Denies abdominal pain, bloating, constipation, diarrhea or blood in the stool.  GU: Denies urgency, frequency, pain with urination, burning sensation, blood  in urine, odor or discharge. Musculoskeletal: Denies decrease in range of motion, difficulty with gait, muscle pain or joint pain and swelling.  Skin: Denies redness, rashes, lesions or ulcercations.  Neurological: Denies dizziness, difficulty with memory, difficulty with speech or problems with balance and coordination.  Psych: Pt reports anxiety. Denies depression, SI/HI.  No other specific complaints in a complete review of systems (except as listed in HPI above).  Objective:   Physical Exam   BP (!) 96/52   Pulse 65   Temp 98.2 F (36.8 C) (Oral)   Ht 5' 1.25" (1.556 m)   Wt 112 lb 4 oz (50.9 kg)   LMP 08/07/2017   SpO2 99%   BMI  21.04 kg/m  Wt Readings from Last 3 Encounters:  08/08/17 112 lb 4 oz (50.9 kg)  07/25/17 113 lb (51.3 kg)  08/03/16 109 lb (49.4 kg)    General: Appears her stated age, well developed, well nourished in NAD. Skin: Warm, dry and intact.  HEENT: Head: normal shape and size; Eyes: sclera white, no icterus, conjunctiva pink, PERRLA and EOMs intact; Ears: Bilateral cerumen impaction; Throat/Mouth: Teeth present, mucosa pink and moist, no exudate, lesions or ulcerations noted.  Neck:  Neck supple, trachea midline. No masses, lumps or thyromegaly present.  Cardiovascular: Normal rate and rhythm. S1,S2 noted.  No murmur, rubs or gallops noted. No JVD or BLE edema.  Pulmonary/Chest: Normal effort and positive vesicular breath sounds. No respiratory distress. No wheezes, rales or ronchi noted.  Abdomen: Soft and nontender. Normal bowel sounds. No distention or masses noted. Liver, spleen and kidneys non palpable. Musculoskeletal: Strength 5/5 BUE/BLE. No difficulty with gait.  Neurological: Alert and oriented. Cranial nerves II-XII grossly intact. Coordination normal.  Psychiatric: Mood and affect normal. Behavior is normal. Judgment and thought content normal.    BMET    Component Value Date/Time   NA 140 08/03/2016 1208   K 4.0 08/03/2016 1208   CL 107 08/03/2016 1208   CO2 26 08/03/2016 1208   GLUCOSE 91 08/03/2016 1208   BUN 16 08/03/2016 1208   CREATININE 0.70 08/03/2016 1208   CALCIUM 9.5 08/03/2016 1208   GFRNONAA >60 11/16/2015 0832   GFRAA >60 11/16/2015 0832    Lipid Panel     Component Value Date/Time   CHOL 146 08/03/2016 1208   TRIG 103.0 08/03/2016 1208   HDL 48.60 08/03/2016 1208   CHOLHDL 3 08/03/2016 1208   VLDL 20.6 08/03/2016 1208   LDLCALC 77 08/03/2016 1208    CBC    Component Value Date/Time   WBC 4.8 08/03/2016 1208   RBC 4.28 08/03/2016 1208   HGB 13.0 08/03/2016 1208   HCT 38.7 08/03/2016 1208   PLT 254.0 08/03/2016 1208   MCV 90.4 08/03/2016 1208    MCH 30.5 11/16/2015 0832   MCHC 33.6 08/03/2016 1208   RDW 13.0 08/03/2016 1208   LYMPHSABS 0.9 11/16/2015 0832   MONOABS 0.8 11/16/2015 0832   EOSABS 0.0 11/16/2015 0832   BASOSABS 0.0 11/16/2015 0832    Hgb A1C No results found for: HGBA1C         Assessment & Plan:   Preventative Health Maintenance:  She declines flu or tetanus booster She declines pap smear today, will request records from previous pap Encouraged her to consume a balanced diet and exercise regimen Advised her to see an eye doctor and dentist annually Will check CBC, CMET and lipid profile today  Bilateral Cerumen Impaction:  She declines lavage today Advised her to  try Debrox OTC  RTC in 1 year, sooner if needed Nicki ReaperBAITY, Pallas Wahlert, NP

## 2017-08-08 NOTE — Assessment & Plan Note (Signed)
Chronic  She is interested in as needed medication therapy but not daily therapy eRx for Atarax 10 mg daily prn Support offered today

## 2017-08-08 NOTE — Assessment & Plan Note (Addendum)
Chronic but stable Advised her to try taking the Topamax daily CBC and CMET today

## 2017-08-08 NOTE — Patient Instructions (Signed)

## 2017-08-09 LAB — LIPID PANEL
CHOLESTEROL: 179 mg/dL (ref 0–200)
HDL: 49.7 mg/dL (ref >39.00)
LDL CALC: 97 mg/dL (ref 0–99)
NonHDL: 129.02
TRIGLYCERIDES: 160 mg/dL — AB (ref 0.0–149.0)
Total CHOL/HDL Ratio: 4
VLDL: 32 mg/dL (ref 0.0–40.0)

## 2017-08-11 ENCOUNTER — Other Ambulatory Visit: Payer: Self-pay | Admitting: Internal Medicine

## 2017-08-12 NOTE — Telephone Encounter (Signed)
Last filled 07/2016.... Please advise if okay to refill

## 2017-08-12 NOTE — Telephone Encounter (Signed)
Will send electronically.

## 2017-08-17 ENCOUNTER — Other Ambulatory Visit: Payer: Self-pay | Admitting: Internal Medicine

## 2017-08-17 DIAGNOSIS — M25512 Pain in left shoulder: Secondary | ICD-10-CM

## 2017-08-18 MED ORDER — MELOXICAM 15 MG PO TABS: 15.0000 mg | ORAL_TABLET | Freq: Every day | ORAL | 0 refills | Status: DC

## 2017-08-18 NOTE — Telephone Encounter (Signed)
Last filled 04/08/17... Please advise

## 2017-08-25 ENCOUNTER — Encounter: Payer: Self-pay | Admitting: Internal Medicine

## 2017-09-14 ENCOUNTER — Encounter: Payer: Self-pay | Admitting: Internal Medicine

## 2017-09-15 MED ORDER — BUSPIRONE HCL 5 MG PO TABS: 5.0000 mg | ORAL_TABLET | Freq: Every day | ORAL | 2 refills | Status: DC

## 2017-09-27 ENCOUNTER — Other Ambulatory Visit: Payer: Self-pay | Admitting: Internal Medicine

## 2017-09-27 DIAGNOSIS — M25512 Pain in left shoulder: Secondary | ICD-10-CM

## 2017-10-20 DIAGNOSIS — Z01419 Encounter for gynecological examination (general) (routine) without abnormal findings: Secondary | ICD-10-CM | POA: Diagnosis not present

## 2017-10-20 DIAGNOSIS — Z130 Encounter for screening for diseases of the blood and blood-forming organs and certain disorders involving the immune mechanism: Secondary | ICD-10-CM | POA: Diagnosis not present

## 2017-10-20 DIAGNOSIS — Z1389 Encounter for screening for other disorder: Secondary | ICD-10-CM | POA: Diagnosis not present

## 2017-10-20 DIAGNOSIS — Z124 Encounter for screening for malignant neoplasm of cervix: Secondary | ICD-10-CM | POA: Diagnosis not present

## 2017-10-21 DIAGNOSIS — Z124 Encounter for screening for malignant neoplasm of cervix: Secondary | ICD-10-CM | POA: Diagnosis not present

## 2017-11-12 ENCOUNTER — Other Ambulatory Visit: Payer: Self-pay | Admitting: Internal Medicine

## 2017-11-12 DIAGNOSIS — M25512 Pain in left shoulder: Secondary | ICD-10-CM

## 2017-11-12 NOTE — Telephone Encounter (Signed)
Last filled 08/18/17... Please advise if okay for pt to continue

## 2017-11-14 MED ORDER — MELOXICAM 15 MG PO TABS: 15.0000 mg | ORAL_TABLET | Freq: Every day | ORAL | 0 refills | Status: DC

## 2018-01-27 ENCOUNTER — Ambulatory Visit: Payer: Commercial Managed Care - PPO | Admitting: Internal Medicine

## 2018-01-27 DIAGNOSIS — Z0289 Encounter for other administrative examinations: Secondary | ICD-10-CM

## 2018-03-02 ENCOUNTER — Other Ambulatory Visit: Payer: Self-pay | Admitting: Internal Medicine

## 2018-03-02 ENCOUNTER — Ambulatory Visit (INDEPENDENT_AMBULATORY_CARE_PROVIDER_SITE_OTHER)
Admission: RE | Admit: 2018-03-02 | Discharge: 2018-03-02 | Disposition: A | Discharge status: Home / Self Care | Payer: Commercial Managed Care - PPO | Source: Ambulatory Visit | Attending: Internal Medicine | Admitting: Internal Medicine

## 2018-03-02 ENCOUNTER — Ambulatory Visit (INDEPENDENT_AMBULATORY_CARE_PROVIDER_SITE_OTHER): Payer: Commercial Managed Care - PPO | Admitting: Internal Medicine

## 2018-03-02 ENCOUNTER — Encounter: Payer: Self-pay | Admitting: Internal Medicine

## 2018-03-02 VITALS — BP 98/66 | HR 102 | Temp 98.6°F | Wt 125.0 lb

## 2018-03-02 DIAGNOSIS — M25571 Pain in right ankle and joints of right foot: Secondary | ICD-10-CM | POA: Diagnosis not present

## 2018-03-02 DIAGNOSIS — R252 Cramp and spasm: Secondary | ICD-10-CM

## 2018-03-02 DIAGNOSIS — M25512 Pain in left shoulder: Secondary | ICD-10-CM

## 2018-03-02 NOTE — Progress Notes (Signed)
Subjective:    Patient ID: Lindsay Nicholson, female    DOB: 11/18/1985, 32 y.o.   MRN: [id number]  HPI  Pt presents to the clinic today with c/o right foot pain. She reports this started 2 months ago after a fall. She describes the pain as achy with intermittent sharp pains. She denies swelling or bruising. The pain is worse with standing for long periods of time or after walking for long periods. She has tried Ibuprofen without any relief.  She also c/o cramping in her left hand. This has been intermittent over the last week. She denies numbness, tingling or weakness. She has not tried anything OTC for her symptoms.   Review of Systems      Past Medical History:  Diagnosis Date  . Anxiety   . SVD (spontaneous vaginal delivery) 09/14/2011    Current Outpatient Medications  Medication Sig Dispense Refill  . busPIRone (BUSPAR) 5 MG tablet Take 1 tablet (5 mg total) by mouth daily. 30 tablet 2  . etonogestrel (NEXPLANON) 68 MG IMPL implant 1 each (68 mg total) by Subdermal route once. 1 each 0  . hydrOXYzine (ATARAX/VISTARIL) 10 MG tablet Take 1 tablet (10 mg total) by mouth daily as needed. 30 tablet 0  . meclizine (ANTIVERT) 25 MG tablet Take 1 tablet (25 mg total) by mouth 3 (three) times daily as needed for dizziness. 30 tablet 0  . meloxicam (MOBIC) 15 MG tablet Take 1 tablet (15 mg total) by mouth daily. 90 tablet 0  . topiramate (TOPAMAX) 25 MG tablet Take 2 tablets (50 mg total) by mouth daily. 60 tablet 5   No current facility-administered medications for this visit.     No Known Allergies  Family History  Problem Relation Age of Onset  . Diabetes Father   . Hypertension Father   . Anesthesia problems Neg Hx   . Cancer Neg Hx   . Heart disease Neg Hx   . Stroke Neg Hx     Social History   Socioeconomic History  . Marital status: Single    Spouse name: Not on file  . Number of children: Not on file  . Years of education: Not on file  . Highest education level: Not on  file  Occupational History  . Not on file  Social Needs  . Financial resource strain: Not on file  . Food insecurity:    Worry: Not on file    Inability: Not on file  . Transportation needs:    Medical: Not on file    Non-medical: Not on file  Tobacco Use  . Smoking status: Current Every Day Smoker    Packs/day: 0.25  . Smokeless tobacco: Never Used  Substance and Sexual Activity  . Alcohol use: Yes    Alcohol/week: 0.0 oz    Comment: occasional  . Drug use: No  . Sexual activity: Yes  Lifestyle  . Physical activity:    Days per week: Not on file    Minutes per session: Not on file  . Stress: Not on file  Relationships  . Social connections:    Talks on phone: Not on file    Gets together: Not on file    Attends religious service: Not on file    Active member of club or organization: Not on file    Attends meetings of clubs or organizations: Not on file    Relationship status: Not on file  . Intimate partner violence:    Fear of current or  ex partner: Not on file    Emotionally abused: Not on file    Physically abused: Not on file    Forced sexual activity: Not on file  Other Topics Concern  . Not on file  Social History Narrative  . Not on file     Constitutional: Denies fever, malaise, fatigue, headache or abrupt weight changes.  Musculoskeletal: Pt reports right foot pain, cramping of left hand. Denies decrease in range of motion, difficulty with gait, or joint swelling.  Skin: Denies redness, rashes, lesions or ulcercations.    No other specific complaints in a complete review of systems (except as listed in HPI above).  Objective:   Physical Exam   BP 98/66   Pulse (!) 102   Temp 98.6 F (37 C) (Oral)   Wt 125 lb (56.7 kg)   SpO2 98%   BMI 23.43 kg/m  Wt Readings from Last 3 Encounters:  03/02/18 125 lb (56.7 kg)  08/08/17 112 lb 4 oz (50.9 kg)  07/25/17 113 lb (51.3 kg)    General: Appears her stated age, well developed, well nourished in  NAD. Skin: Warm, dry and intact. No swelling or bruising noted of right ankle. Cardiovascular: Right pedal pulse 2+, left radial pulse 2+. Musculoskeletal: Normal flexion, extension and rotation of right ankle. No joint swelling noted. Pain with palpation posterior to the medial malleolus. No difficulty with gait. Normal flexion, extension and rotation of the left wrist. No joint swelling noted. Hand grips equal. Neurological: Sensation intact to BUE/BLE.  BMET    Component Value Date/Time   NA 138 08/08/2017 1501   K 3.5 08/08/2017 1501   CL 104 08/08/2017 1501   CO2 24 08/08/2017 1501   GLUCOSE 95 08/08/2017 1501   BUN 13 08/08/2017 1501   CREATININE 0.67 08/08/2017 1501   CALCIUM 9.6 08/08/2017 1501   GFRNONAA >60 11/16/2015 0832   GFRAA >60 11/16/2015 0832    Lipid Panel     Component Value Date/Time   CHOL 179 08/08/2017 1501   TRIG 160.0 (H) 08/08/2017 1501   HDL 49.70 08/08/2017 1501   CHOLHDL 4 08/08/2017 1501   VLDL 32.0 08/08/2017 1501   LDLCALC 97 08/08/2017 1501    CBC    Component Value Date/Time   WBC 8.9 08/08/2017 1501   RBC 4.18 08/08/2017 1501   HGB 13.1 08/08/2017 1501   HCT 39.9 08/08/2017 1501   PLT 295.0 08/08/2017 1501   MCV 95.5 08/08/2017 1501   MCH 30.5 11/16/2015 0832   MCHC 32.7 08/08/2017 1501   RDW 13.4 08/08/2017 1501   LYMPHSABS 0.9 11/16/2015 0832   MONOABS 0.8 11/16/2015 0832   EOSABS 0.0 11/16/2015 0832   BASOSABS 0.0 11/16/2015 0832    Hgb A1C No results found for: HGBA1C         Assessment & Plan:   Right Ankle Pain:  Xray right ankle today Discussed compression while up and ambulating Discussed NSAID's, rest and ice as needed May benefit from PT  Cramping, Left Hand:  Increase water intake Start MVI OTC  Return precautions discussed, will follow up after xray Nicki Reaper, NP

## 2018-03-02 NOTE — Patient Instructions (Signed)
RICE for Routine Care of Injuries Many injuries can be cared for using rest, ice, compression, and elevation (RICE therapy). Using RICE therapy can help to lessen pain and swelling. It can help your body to heal. Rest Reduce your normal activities and avoid using the injured part of your body. You can go back to your normal activities when you feel okay and your doctor says it is okay. Ice Do not put ice on your bare skin.  Put ice in a plastic bag.  Place a towel between your skin and the bag.  Leave the ice on for 20 minutes, 2-3 times a day.  Do this for as long as told by your doctor. Compression Compression means putting pressure on the injured area. This can be done with an elastic bandage. If an elastic bandage has been applied:  Remove and reapply the bandage every 3-4 hours or as told by your doctor.  Make sure the bandage is not wrapped too tight. Wrap the bandage more loosely if part of your body beyond the bandage is blue, swollen, cold, painful, or loses feeling (numb).  See your doctor if the bandage seems to make your problems worse.  Elevation Elevation means keeping the injured area raised. Raise the injured area above your heart or the center of your chest if you can. When should I get help? You should get help if:  You keep having pain and swelling.  Your symptoms get worse.  Get help right away if: You should get help right away if:  You have sudden bad pain at or below the area of your injury.  You have redness or more swelling around your injury.  You have tingling or numbness at or below the injury that does not go away when you take off the bandage.  This information is not intended to replace advice given to you by your health care provider. Make sure you discuss any questions you have with your health care provider. Document Released: 02/16/2008 Document Revised: 07/27/2016 Document Reviewed: 08/07/2014 Elsevier Interactive Patient Education  2017  Elsevier Inc.  

## 2018-03-03 MED ORDER — MELOXICAM 15 MG PO TABS: 15.0000 mg | ORAL_TABLET | Freq: Every day | ORAL | 0 refills | Status: DC

## 2018-04-06 ENCOUNTER — Encounter: Payer: Self-pay | Admitting: Internal Medicine

## 2018-04-10 MED ORDER — VARENICLINE TARTRATE 0.5 MG X 11 & 1 MG X 42 PO MISC: ORAL | 0 refills | Status: DC

## 2018-04-12 NOTE — Telephone Encounter (Signed)
Rx has been faxed.

## 2018-04-17 ENCOUNTER — Other Ambulatory Visit: Payer: Self-pay | Admitting: Internal Medicine

## 2018-04-18 NOTE — Telephone Encounter (Signed)
I have already re-faxed Rx

## 2018-04-19 ENCOUNTER — Telehealth: Payer: Self-pay

## 2018-04-19 NOTE — Telephone Encounter (Signed)
PA has been submitted via [email or web address] awaiting response 

## 2018-04-25 ENCOUNTER — Other Ambulatory Visit: Payer: Self-pay | Admitting: Internal Medicine

## 2018-04-26 NOTE — Telephone Encounter (Signed)
Last filled 09/15/17 with 2 refills... Please advise if appropriate to refill

## 2018-04-26 NOTE — Telephone Encounter (Signed)
The only other thing is Wellbutrin which is an antidepressant but works by decreasing your craving for nicotine. Let me know what you think about trying that. Other alternatives would be the patches over the counter.

## 2018-04-26 NOTE — Telephone Encounter (Signed)
It looks like she hasn't been taking this regularly. Denied. Will need to discuss at OV if she wishes.

## 2018-04-26 NOTE — Telephone Encounter (Signed)
Medication has been denied--letter forwarded to pharmacy

## 2018-04-27 MED ORDER — BUPROPION HCL 75 MG PO TABS: ORAL_TABLET | ORAL | 2 refills | Status: DC

## 2018-04-27 NOTE — Addendum Note (Signed)
Addended by: Lorre MunroeBAITY, Jenene Kauffmann W on: 04/27/2018 04:58 PM   Modules accepted: Orders

## 2018-05-17 DIAGNOSIS — Z3042 Encounter for surveillance of injectable contraceptive: Secondary | ICD-10-CM | POA: Diagnosis not present

## 2018-05-30 ENCOUNTER — Encounter: Payer: Self-pay | Admitting: Internal Medicine

## 2018-06-01 ENCOUNTER — Other Ambulatory Visit: Payer: Self-pay | Admitting: Internal Medicine

## 2018-06-01 NOTE — Telephone Encounter (Signed)
Last filled 09/2017 with 2 refills... Please advise if okay to refill

## 2018-06-09 ENCOUNTER — Encounter: Payer: Commercial Managed Care - PPO | Admitting: Internal Medicine

## 2018-07-03 ENCOUNTER — Encounter: Payer: Self-pay | Admitting: Internal Medicine

## 2018-07-04 ENCOUNTER — Other Ambulatory Visit: Payer: Self-pay | Admitting: Internal Medicine

## 2018-07-04 DIAGNOSIS — M25512 Pain in left shoulder: Secondary | ICD-10-CM

## 2018-07-24 ENCOUNTER — Encounter: Payer: Self-pay | Admitting: Internal Medicine

## 2018-07-24 MED ORDER — TOPIRAMATE 25 MG PO TABS: 75.0000 mg | ORAL_TABLET | Freq: Every day | ORAL | 2 refills | Status: DC

## 2018-07-28 DIAGNOSIS — Z3042 Encounter for surveillance of injectable contraceptive: Secondary | ICD-10-CM | POA: Diagnosis not present

## 2018-08-09 ENCOUNTER — Encounter: Payer: Self-pay | Admitting: Internal Medicine

## 2018-08-09 ENCOUNTER — Ambulatory Visit (INDEPENDENT_AMBULATORY_CARE_PROVIDER_SITE_OTHER): Payer: Commercial Managed Care - PPO | Admitting: Internal Medicine

## 2018-08-09 VITALS — BP 102/66 | HR 90 | Temp 98.0°F | Ht 61.25 in | Wt 125.0 lb

## 2018-08-09 DIAGNOSIS — K219 Gastro-esophageal reflux disease without esophagitis: Secondary | ICD-10-CM | POA: Insufficient documentation

## 2018-08-09 DIAGNOSIS — M25519 Pain in unspecified shoulder: Secondary | ICD-10-CM

## 2018-08-09 DIAGNOSIS — M25512 Pain in left shoulder: Secondary | ICD-10-CM | POA: Diagnosis not present

## 2018-08-09 DIAGNOSIS — H6123 Impacted cerumen, bilateral: Secondary | ICD-10-CM

## 2018-08-09 DIAGNOSIS — Z0001 Encounter for general adult medical examination with abnormal findings: Secondary | ICD-10-CM

## 2018-08-09 DIAGNOSIS — F411 Generalized anxiety disorder: Secondary | ICD-10-CM

## 2018-08-09 DIAGNOSIS — R519 Headache, unspecified: Secondary | ICD-10-CM

## 2018-08-09 DIAGNOSIS — Z0000 Encounter for general adult medical examination without abnormal findings: Secondary | ICD-10-CM

## 2018-08-09 DIAGNOSIS — R51 Headache: Secondary | ICD-10-CM

## 2018-08-09 DIAGNOSIS — G8929 Other chronic pain: Secondary | ICD-10-CM | POA: Insufficient documentation

## 2018-08-09 LAB — COMPREHENSIVE METABOLIC PANEL
ALK PHOS: 66 U/L (ref 39–117)
ALT: 13 U/L (ref 0–35)
AST: 13 U/L (ref 0–37)
Albumin: 5 g/dL (ref 3.5–5.2)
BUN: 17 mg/dL (ref 6–23)
CALCIUM: 9.9 mg/dL (ref 8.4–10.5)
CO2: 22 mEq/L (ref 19–32)
Chloride: 110 mEq/L (ref 96–112)
Creatinine, Ser: 1.05 mg/dL (ref 0.40–1.20)
GFR: 64.1 mL/min (ref >60.00)
Glucose, Bld: 81 mg/dL (ref 70–99)
POTASSIUM: 4.1 meq/L (ref 3.5–5.1)
SODIUM: 139 meq/L (ref 135–145)
TOTAL PROTEIN: 8.2 g/dL (ref 6.0–8.3)
Total Bilirubin: 0.2 mg/dL (ref 0.2–1.2)

## 2018-08-09 LAB — LIPID PANEL
CHOLESTEROL: 166 mg/dL (ref 0–200)
HDL: 31.8 mg/dL — ABNORMAL LOW (ref >39.00)
NonHDL: 134.27
Total CHOL/HDL Ratio: 5
Triglycerides: 254 mg/dL — ABNORMAL HIGH (ref 0.0–149.0)
VLDL: 50.8 mg/dL — AB (ref 0.0–40.0)

## 2018-08-09 LAB — CBC
HEMATOCRIT: 40.6 % (ref 36.0–46.0)
Hemoglobin: 13.7 g/dL (ref 12.0–15.0)
MCHC: 33.8 g/dL (ref 30.0–36.0)
MCV: 92.9 fl (ref 78.0–100.0)
Platelets: 281 10*3/uL (ref 150.0–400.0)
RBC: 4.38 Mil/uL (ref 3.87–5.11)
RDW: 13.1 % (ref 11.5–15.5)
WBC: 6.3 10*3/uL (ref 4.0–10.5)

## 2018-08-09 LAB — LDL CHOLESTEROL, DIRECT: Direct LDL: 106 mg/dL

## 2018-08-09 MED ORDER — TOPIRAMATE 100 MG PO TABS: 100.0000 mg | ORAL_TABLET | Freq: Every day | ORAL | 5 refills | Status: DC

## 2018-08-09 MED ORDER — BUSPIRONE HCL 10 MG PO TABS: 10.0000 mg | ORAL_TABLET | Freq: Two times a day (BID) | ORAL | 5 refills | Status: DC

## 2018-08-09 MED ORDER — DICLOFENAC SODIUM 75 MG PO TBEC: 75.0000 mg | DELAYED_RELEASE_TABLET | Freq: Two times a day (BID) | ORAL | 5 refills | Status: DC

## 2018-08-09 NOTE — Assessment & Plan Note (Signed)
Stop Meloxicam Start Diclofenac 75 mg BID, advised to consume with food If worse, consider xray vs PT

## 2018-08-09 NOTE — Assessment & Plan Note (Signed)
Increase Topamax to 100 mg daily If worse, consider referral to neurology

## 2018-08-09 NOTE — Assessment & Plan Note (Signed)
Trial Prilosec OTC ? NSAID induced CBC and CMET today  Advised her to update me in 2 weeks and let me know how epigastric pain is

## 2018-08-09 NOTE — Progress Notes (Signed)
Subjective:    Patient ID: Lindsay Nicholson, female    DOB: 07/21/1986, 32 y.o.   MRN: [id number]  HPI  Pt presents to the clinic today for her annual exam. She is also due to follow up chronic conditions.  Anxiety: Triggered by general life stress. Managed on Buspar and Hydroxyzine but she does not feel like her anxiety is well controlled. She denies depression, SI/HI.  Frequent Headaches: Chronic on Topamax. She occasionally gets headaches despite taking the Topamax. She takes Excedrin with some relief. She does not follow with neurology at this time.  Shoulder Pain: Intermittent. She reports she had rotator cuff surgery 15 years ago. She never went to PT post op. She takes Meloxicam as needed with minimal relief.   Flu: 06/2018 Tetanus: 08/2011 Pap Smear: 2019, [facility]ist: annually  Diet: She does eat meat. She consumes some fruits more veggies. She does eat fried foods. She drinks mostly coffee. Exercise: None  Review of Systems      Past Medical History:  Diagnosis Date  . Anxiety   . SVD (spontaneous vaginal delivery) 09/14/2011    Current Outpatient Medications  Medication Sig Dispense Refill  . buPROPion (WELLBUTRIN) 75 MG tablet Take 1 tab PO daily x 1 week then increase to 1 tab PO BID 60 tablet 2  . busPIRone (BUSPAR) 5 MG tablet TAKE 1 TABLET BY MOUTH ONCE DAILY 30 tablet 2  . hydrOXYzine (ATARAX/VISTARIL) 10 MG tablet Take 1 tablet (10 mg total) by mouth daily as needed. 30 tablet 0  . meclizine (ANTIVERT) 25 MG tablet Take 1 tablet (25 mg total) by mouth 3 (three) times daily as needed for dizziness. 30 tablet 0  . medroxyPROGESTERone Acetate 150 MG/ML SUSY medroxyprogesterone 150 mg/mL intramuscular syringe    . meloxicam (MOBIC) 15 MG tablet TAKE 1 TABLET BY MOUTH ONCE DAILY 90 tablet 0  . topiramate (TOPAMAX) 25 MG tablet Take 3 tablets (75 mg total) by mouth daily. 90 tablet 2  . varenicline (CHANTIX STARTING MONTH PAK) 0.5 MG X 11 & 1 MG X 42 tablet Take one  0.5 mg tablet by mouth once daily for 3 days, then increase to one 0.5 mg tablet twice daily for 4 days, then increase to one 1 mg tablet twice daily. 53 tablet 0   No current facility-administered medications for this visit.     No Known Allergies  Family History  Problem Relation Age of Onset  . Diabetes Father   . Hypertension Father   . Anesthesia problems Neg Hx   . Cancer Neg Hx   . Heart disease Neg Hx   . Stroke Neg Hx     Social History   Socioeconomic History  . Marital status: Single    Spouse name: Not on file  . Number of children: Not on file  . Years of education: Not on file  . Highest education level: Not on file  Occupational History  . Not on file  Social Needs  . Financial resource strain: Not on file  . Food insecurity:    Worry: Not on file    Inability: Not on file  . Transportation needs:    Medical: Not on file    Non-medical: Not on file  Tobacco Use  . Smoking status: Current Every Day Smoker    Packs/day: 0.25  . Smokeless tobacco: Never Used  Substance and Sexual Activity  . Alcohol use: Yes    Alcohol/week: 0.0 standard drinks    Comment: occasional  .  Drug use: No  . Sexual activity: Yes  Lifestyle  . Physical activity:    Days per week: Not on file    Minutes per session: Not on file  . Stress: Not on file  Relationships  . Social connections:    Talks on phone: Not on file    Gets together: Not on file    Attends religious service: Not on file    Active member of club or organization: Not on file    Attends meetings of clubs or organizations: Not on file    Relationship status: Not on file  . Intimate partner violence:    Fear of current or ex partner: Not on file    Emotionally abused: Not on file    Physically abused: Not on file    Forced sexual activity: Not on file  Other Topics Concern  . Not on file  Social History Narrative  . Not on file     Constitutional: Pt reports headaches. Denies fever, malaise,  fatigue, or abrupt weight changes.  HEENT: Denies eye pain, eye redness, ear pain, ringing in the ears, wax buildup, runny nose, nasal congestion, bloody nose, or sore throat. Respiratory: Denies difficulty breathing, shortness of breath, cough or sputum production.   Cardiovascular: Denies chest pain, chest tightness, palpitations or swelling in the hands or feet.  Gastrointestinal: Pt reports reflux. Denies abdominal pain, bloating, constipation, diarrhea or blood in the stool.  GU: Denies urgency, frequency, pain with urination, burning sensation, blood in urine, odor or discharge. Musculoskeletal: Pt reports intermittent left shoulder pain. Denies decrease in range of motion, difficulty with gait, muscle pain or joint swelling.  Skin: Denies redness, rashes, lesions or ulcercations.  Neurological: Denies dizziness, difficulty with memory, difficulty with speech or problems with balance and coordination.  Psych: Pt reports anxiety. Denies depression, SI/HI.  No other specific complaints in a complete review of systems (except as listed in HPI above).  Objective:   Physical Exam   BP 102/66   Pulse 90   Temp 98 F (36.7 C) (Oral)   Ht 5' 1.25" (1.556 m)   Wt 125 lb (56.7 kg)   SpO2 98%   BMI 23.43 kg/m  Wt Readings from Last 3 Encounters:  08/09/18 125 lb (56.7 kg)  03/02/18 125 lb (56.7 kg)  08/08/17 112 lb 4 oz (50.9 kg)    General: Appears her stated age, well developed, well nourished in NAD. Skin: Warm, dry and intact.  HEENT: Head: normal shape and size; Eyes: sclera white, no icterus, conjunctiva pink, PERRLA and EOMs intact; Ears: bilateral cerumen impaction; Throat/Mouth: Teeth present, mucosa pink and moist, no exudate, lesions or ulcerations noted.  Neck:  Neck supple, trachea midline. No masses, lumps or thyromegaly present.  Cardiovascular: Normal rate and rhythm. S1,S2 noted.  No murmur, rubs or gallops noted. No JVD or BLE edema.  Pulmonary/Chest: Normal effort  and positive vesicular breath sounds. No respiratory distress. No wheezes, rales or ronchi noted.  Abdomen: Soft and nontender. Normal bowel sounds. No distention or masses noted. Liver, spleen and kidneys non palpable. Musculoskeletal: Normal internal and external rotation of bilateral shoulders. Pain with palpation over the left AC joint, anterior biceps tendon. Strength 5/5 BUE/BLE. No difficulty with gait.  Neurological: Alert and oriented. Cranial nerves II-XII grossly intact. Coordination normal.  Psychiatric: Mood and affect normal. Behavior is normal. Judgment and thought content normal.     BMET    Component Value Date/Time   NA 138 08/08/2017 1501  K 3.5 08/08/2017 1501   CL 104 08/08/2017 1501   CO2 24 08/08/2017 1501   GLUCOSE 95 08/08/2017 1501   BUN 13 08/08/2017 1501   CREATININE 0.67 08/08/2017 1501   CALCIUM 9.6 08/08/2017 1501   GFRNONAA >60 11/16/2015 0832   GFRAA >60 11/16/2015 0832    Lipid Panel     Component Value Date/Time   CHOL 179 08/08/2017 1501   TRIG 160.0 (H) 08/08/2017 1501   HDL 49.70 08/08/2017 1501   CHOLHDL 4 08/08/2017 1501   VLDL 32.0 08/08/2017 1501   LDLCALC 97 08/08/2017 1501    CBC    Component Value Date/Time   WBC 8.9 08/08/2017 1501   RBC 4.18 08/08/2017 1501   HGB 13.1 08/08/2017 1501   HCT 39.9 08/08/2017 1501   PLT 295.0 08/08/2017 1501   MCV 95.5 08/08/2017 1501   MCH 30.5 11/16/2015 0832   MCHC 32.7 08/08/2017 1501   RDW 13.4 08/08/2017 1501   LYMPHSABS 0.9 11/16/2015 0832   MONOABS 0.8 11/16/2015 0832   EOSABS 0.0 11/16/2015 0832   BASOSABS 0.0 11/16/2015 0832    Hgb A1C No results found for: HGBA1C         Assessment & Plan:   Preventative Health Maintenance:  Flu shot UTD Tetanus UTD Pap smear UTD Encouraged her to consume a balanced diet and exercise regimen Advised her to see a dentist annually Will check CBC, CMET and Lipid profile today  Bilateral Cerumen Impaction:  She declines lavage  by CMA  Advised her to try Debrox OTC RTC in 1 year, sooner if needed Nicki Reaper, NP

## 2018-08-09 NOTE — Assessment & Plan Note (Signed)
Deteriorated Increase Buspar to 10 mg BID Continue Hydroxyzine prn Support offered today

## 2018-08-09 NOTE — Patient Instructions (Signed)

## 2018-08-29 ENCOUNTER — Encounter: Payer: Self-pay | Admitting: Internal Medicine

## 2018-09-01 ENCOUNTER — Ambulatory Visit: Payer: Commercial Managed Care - PPO | Admitting: Internal Medicine

## 2018-09-01 DIAGNOSIS — Z0289 Encounter for other administrative examinations: Secondary | ICD-10-CM

## 2018-09-01 NOTE — Progress Notes (Deleted)
Subjective:    Patient ID: Lindsay Nicholson, female    DOB: 08/11/1986, 32 y.o.   MRN: [id number]  HPI  Pt presents to the clinic today with c/o sore throat. She reports this started. She reports her child was just diagnosed with strep throat.  Review of Systems  Past Medical History:  Diagnosis Date  . Anxiety   . SVD (spontaneous vaginal delivery) 09/14/2011    Current Outpatient Medications  Medication Sig Dispense Refill  . busPIRone (BUSPAR) 10 MG tablet Take 1 tablet (10 mg total) by mouth 2 (two) times daily. At 8 am and 2 pm 60 tablet 5  . diclofenac (VOLTAREN) 75 MG EC tablet Take 1 tablet (75 mg total) by mouth 2 (two) times daily. 60 tablet 5  . hydrOXYzine (ATARAX/VISTARIL) 10 MG tablet Take 1 tablet (10 mg total) by mouth daily as needed. 30 tablet 0  . medroxyPROGESTERone Acetate 150 MG/ML SUSY medroxyprogesterone 150 mg/mL intramuscular syringe    . topiramate (TOPAMAX) 100 MG tablet Take 1 tablet (100 mg total) by mouth daily. 30 tablet 5   No current facility-administered medications for this visit.     No Known Allergies  Family History  Problem Relation Age of Onset  . Diabetes Father   . Hypertension Father   . Anesthesia problems Neg Hx   . Cancer Neg Hx   . Heart disease Neg Hx   . Stroke Neg Hx     Social History   Socioeconomic History  . Marital status: Single    Spouse name: Not on file  . Number of children: Not on file  . Years of education: Not on file  . Highest education level: Not on file  Occupational History  . Not on file  Social Needs  . Financial resource strain: Not on file  . Food insecurity:    Worry: Not on file    Inability: Not on file  . Transportation needs:    Medical: Not on file    Non-medical: Not on file  Tobacco Use  . Smoking status: Current Every Day Smoker    Packs/day: 0.25    Types: Cigarettes  . Smokeless tobacco: Never Used  Substance and Sexual Activity  . Alcohol use: Yes    Alcohol/week: 0.0  standard drinks    Comment: occasional  . Drug use: No  . Sexual activity: Yes  Lifestyle  . Physical activity:    Days per week: Not on file    Minutes per session: Not on file  . Stress: Not on file  Relationships  . Social connections:    Talks on phone: Not on file    Gets together: Not on file    Attends religious service: Not on file    Active member of club or organization: Not on file    Attends meetings of clubs or organizations: Not on file    Relationship status: Not on file  . Intimate partner violence:    Fear of current or ex partner: Not on file    Emotionally abused: Not on file    Physically abused: Not on file    Forced sexual activity: Not on file  Other Topics Concern  . Not on file  Social History Narrative  . Not on file     Constitutional: Denies fever, malaise, fatigue, headache or abrupt weight changes.  HEENT: Pt reports sore throat. Denies eye pain, eye redness, ear pain, ringing in the ears, wax buildup, runny nose, nasal congestion, bloody  nose Respiratory: Denies difficulty breathing, shortness of breath, cough or sputum production.   Cardiovascular: Denies chest pain, chest tightness, palpitations or swelling in the hands or feet.  Gastrointestinal: Denies abdominal pain, bloating, constipation, diarrhea or blood in the stool.  GU: Denies urgency, frequency, pain with urination, burning sensation, blood in urine, odor or discharge. Musculoskeletal: Denies decrease in range of motion, difficulty with gait, muscle pain or joint pain and swelling.  Skin: Denies redness, rashes, lesions or ulcercations.  Neurological: Denies dizziness, difficulty with memory, difficulty with speech or problems with balance and coordination.  Psych: Denies anxiety, depression, SI/HI.  No other specific complaints in a complete review of systems (except as listed in HPI above).     Objective:   Physical Exam        Assessment & Plan:

## 2018-10-10 ENCOUNTER — Encounter: Payer: Self-pay | Admitting: Internal Medicine

## 2018-10-10 DIAGNOSIS — G43C1 Periodic headache syndromes in child or adult, intractable: Secondary | ICD-10-CM

## 2018-10-11 MED ORDER — SUMATRIPTAN SUCCINATE 25 MG PO TABS: 25.0000 mg | ORAL_TABLET | ORAL | 0 refills | Status: DC | PRN

## 2018-10-11 NOTE — Addendum Note (Signed)
Addended by: Lorre Munroe on: 10/11/2018 12:41 PM   Modules accepted: Orders

## 2018-10-20 DIAGNOSIS — Z30013 Encounter for initial prescription of injectable contraceptive: Secondary | ICD-10-CM | POA: Diagnosis not present

## 2018-11-06 ENCOUNTER — Other Ambulatory Visit: Payer: Self-pay | Admitting: Internal Medicine

## 2018-11-06 MED ORDER — OSELTAMIVIR PHOSPHATE 75 MG PO CAPS: 75.0000 mg | ORAL_CAPSULE | Freq: Every day | ORAL | 0 refills | Status: DC

## 2018-11-20 ENCOUNTER — Ambulatory Visit (INDEPENDENT_AMBULATORY_CARE_PROVIDER_SITE_OTHER): Payer: Commercial Managed Care - PPO | Admitting: Internal Medicine

## 2018-11-20 ENCOUNTER — Encounter: Payer: Self-pay | Admitting: Internal Medicine

## 2018-11-20 VITALS — BP 102/58 | HR 113 | Temp 98.1°F | Ht 61.25 in | Wt 120.8 lb

## 2018-11-20 DIAGNOSIS — J069 Acute upper respiratory infection, unspecified: Secondary | ICD-10-CM

## 2018-11-20 DIAGNOSIS — B9789 Other viral agents as the cause of diseases classified elsewhere: Secondary | ICD-10-CM

## 2018-11-20 DIAGNOSIS — J301 Allergic rhinitis due to pollen: Secondary | ICD-10-CM | POA: Diagnosis not present

## 2018-11-20 MED ORDER — METHYLPREDNISOLONE ACETATE 80 MG/ML IJ SUSP
80.0000 mg | Freq: Once | INTRAMUSCULAR | Status: AC
  Administered 2018-11-20: 80 mg via INTRAMUSCULAR

## 2018-11-20 MED ORDER — HYDROCODONE-HOMATROPINE 5-1.5 MG/5ML PO SYRP: 5.0000 mL | ORAL_SOLUTION | Freq: Three times a day (TID) | ORAL | 0 refills | Status: DC | PRN

## 2018-11-20 NOTE — Progress Notes (Signed)
Subjective:    Patient ID: Lindsay Nicholson, female    DOB: December 11, 1985, 33 y.o.   MRN: [id number]  HPI  Pt presents to the clinic today with c/o facial pressure, nasal congestion, ear fullness and cough. She reports this started 3 days ago. She is not blowing anything out of her nose. She denies ear pain or decreased hearing. The cough is productive of green mucous at times. She denies runny nose, sore throat, or shortness of breath. She denies fever or body aches but has had chills. She has tried Dayquil and Mucinex with minimal relief. She has not had sick contacts. She did get her flu shot.  Review of Systems      Past Medical History:  Diagnosis Date  . Anxiety   . SVD (spontaneous vaginal delivery) 09/14/2011    Current Outpatient Medications  Medication Sig Dispense Refill  . busPIRone (BUSPAR) 10 MG tablet Take 1 tablet (10 mg total) by mouth 2 (two) times daily. At 8 am and 2 pm 60 tablet 5  . diclofenac (VOLTAREN) 75 MG EC tablet Take 1 tablet (75 mg total) by mouth 2 (two) times daily. 60 tablet 5  . hydrOXYzine (ATARAX/VISTARIL) 10 MG tablet Take 1 tablet (10 mg total) by mouth daily as needed. 30 tablet 0  . medroxyPROGESTERone Acetate 150 MG/ML SUSY medroxyprogesterone 150 mg/mL intramuscular syringe    . oseltamivir (TAMIFLU) 75 MG capsule Take 1 capsule (75 mg total) by mouth daily. 10 capsule 0  . SUMAtriptan (IMITREX) 25 MG tablet Take 1 tablet (25 mg total) by mouth every 2 (two) hours as needed for migraine. May repeat in 2 hours if headache persists or recurs. 10 tablet 0  . topiramate (TOPAMAX) 100 MG tablet Take 1 tablet (100 mg total) by mouth daily. 30 tablet 5  . HYDROcodone-homatropine (HYCODAN) 5-1.5 MG/5ML syrup Take 5 mLs by mouth every 8 (eight) hours as needed for cough. 120 mL 0   No current facility-administered medications for this visit.     No Known Allergies  Family History  Problem Relation Age of Onset  . Diabetes Father   . Hypertension Father     . Anesthesia problems Neg Hx   . Cancer Neg Hx   . Heart disease Neg Hx   . Stroke Neg Hx     Social History   Socioeconomic History  . Marital status: Single    Spouse name: Not on file  . Number of children: Not on file  . Years of education: Not on file  . Highest education level: Not on file  Occupational History  . Not on file  Social Needs  . Financial resource strain: Not on file  . Food insecurity:    Worry: Not on file    Inability: Not on file  . Transportation needs:    Medical: Not on file    Non-medical: Not on file  Tobacco Use  . Smoking status: Current Every Day Smoker    Packs/day: 0.25    Types: Cigarettes  . Smokeless tobacco: Never Used  Substance and Sexual Activity  . Alcohol use: Yes    Alcohol/week: 0.0 standard drinks    Comment: occasional  . Drug use: No  . Sexual activity: Yes  Lifestyle  . Physical activity:    Days per week: Not on file    Minutes per session: Not on file  . Stress: Not on file  Relationships  . Social connections:    Talks on phone: Not on  file    Gets together: Not on file    Attends religious service: Not on file    Active member of club or organization: Not on file    Attends meetings of clubs or organizations: Not on file    Relationship status: Not on file  . Intimate partner violence:    Fear of current or ex partner: Not on file    Emotionally abused: Not on file    Physically abused: Not on file    Forced sexual activity: Not on file  Other Topics Concern  . Not on file  Social History Narrative  . Not on file     Constitutional: Denies fever, malaise, fatigue, headache or abrupt weight changes.  HEENT: Pt reports facial pressure, nasal congestion, and ear fullness. Denies eye pain, eye redness, ear pain, ringing in the ears, wax buildup, runny nose, bloody nose, or sore throat. Respiratory: Pt reports cough. Denies difficulty breathing, shortness of breath.   Cardiovascular: Denies chest pain,  chest tightness, palpitations or swelling in the hands or feet.   No other specific complaints in a complete review of systems (except as listed in HPI above).  Objective:   Physical Exam  BP (!) 102/58   Pulse (!) 113   Temp 98.1 F (36.7 C)   Ht 5' 1.25" (1.556 m)   Wt 120 lb 12.8 oz (54.8 kg)   SpO2 97%   BMI 22.64 kg/m  Wt Readings from Last 3 Encounters:  11/20/18 120 lb 12.8 oz (54.8 kg)  08/09/18 125 lb (56.7 kg)  03/02/18 125 lb (56.7 kg)    General: Appears her stated age, well developed, well nourished in NAD. HEENT: Head: normal shape and size, no sinus tenderness noted;Ears: Tm's gray and intact, normal light reflex; Nose: mucosa boggy and moist, septum midline; Throat/Mouth: Teeth present, mucosa erythematous and moist, + PND, no exudate, lesions or ulcerations noted.  Neck:  Neck supple, trachea midline. No masses, lumps or thyromegaly present.  Cardiovascular: Normal rate and rhythm. S1,S2 noted.  No murmur, rubs or gallops noted.  Pulmonary/Chest: Normal effort and positive vesicular breath sounds. No respiratory distress. No wheezes, rales or ronchi noted.  Neurological: Alert and oriented.    BMET    Component Value Date/Time   NA 139 08/09/2018 1450   K 4.1 08/09/2018 1450   CL 110 08/09/2018 1450   CO2 22 08/09/2018 1450   GLUCOSE 81 08/09/2018 1450   BUN 17 08/09/2018 1450   CREATININE 1.05 08/09/2018 1450   CALCIUM 9.9 08/09/2018 1450   GFRNONAA >60 11/16/2015 0832   GFRAA >60 11/16/2015 0832    Lipid Panel     Component Value Date/Time   CHOL 166 08/09/2018 1450   TRIG 254.0 (H) 08/09/2018 1450   HDL 31.80 (L) 08/09/2018 1450   CHOLHDL 5 08/09/2018 1450   VLDL 50.8 (H) 08/09/2018 1450   LDLCALC 97 08/08/2017 1501    CBC    Component Value Date/Time   WBC 6.3 08/09/2018 1450   RBC 4.38 08/09/2018 1450   HGB 13.7 08/09/2018 1450   HCT 40.6 08/09/2018 1450   PLT 281.0 08/09/2018 1450   MCV 92.9 08/09/2018 1450   MCH 30.5 11/16/2015  0832   MCHC 33.8 08/09/2018 1450   RDW 13.1 08/09/2018 1450   LYMPHSABS 0.9 11/16/2015 0832   MONOABS 0.8 11/16/2015 0832   EOSABS 0.0 11/16/2015 0832   BASOSABS 0.0 11/16/2015 0832    Hgb A1C No results found for: HGBA1C  Assessment & Plan:   Allergic Rhinitis, Viral URI with Cough:  Get some rest and drink plenty of fluids Stop Dayquil and Mucinex Start Zyrtec and Flonase OTC 80 mg Depo IM today RX for Hycodan for cough Work note provided  Return precautions discussed Nicki Reaper, NP

## 2018-11-20 NOTE — Progress Notes (Signed)
Pre visit review using our clinic review tool, if applicable. No additional management support is needed unless otherwise documented below in the visit note. 

## 2018-11-20 NOTE — Patient Instructions (Signed)

## 2018-12-06 ENCOUNTER — Telehealth: Payer: Self-pay | Admitting: *Deleted

## 2018-12-06 NOTE — Telephone Encounter (Signed)
Spoke with pt & advised that d/t covid 19 pandemic, our office is severely reducing in person visits for at least the next two weeks, in order to minimize the risk to our patients and healthcare providers. Advised that we recommend patient convert to a telemedicine visit. Although there may be some limitations with this type of visit, we will take all precautions to reduce any security or privacy concerns. This will be treated like an in-office visit and we will file with your insurance, and there may be a patient responsible charge related to this service.   Patient consented to do a video webex visit that we will file to her insurance and she wished to r/s to next week. I scheduled her for Monday 12/01/2018 @ 12:00 pm. She was given the directions on the app to download if using a smart phone with camera and the website to use if she wishes to download on computer with camera. She provided her email address (see below). I will call her Friday to obtain her medical hx, meds, etc. She verbalized appreciation.  Pt aware to join about 5 minutes before the visit and to please call if she doesn't receive email.  [email or web address]  Webex meeting created & email sent to pt.

## 2018-12-07 ENCOUNTER — Ambulatory Visit: Payer: Commercial Managed Care - PPO | Admitting: Neurology

## 2018-12-11 ENCOUNTER — Other Ambulatory Visit: Payer: Self-pay

## 2018-12-11 ENCOUNTER — Ambulatory Visit (INDEPENDENT_AMBULATORY_CARE_PROVIDER_SITE_OTHER): Payer: Commercial Managed Care - PPO | Admitting: Neurology

## 2018-12-11 ENCOUNTER — Encounter: Payer: Self-pay | Admitting: *Deleted

## 2018-12-11 ENCOUNTER — Encounter: Payer: Self-pay | Admitting: Neurology

## 2018-12-11 DIAGNOSIS — G441 Vascular headache, not elsewhere classified: Secondary | ICD-10-CM

## 2018-12-11 DIAGNOSIS — R51 Headache with orthostatic component, not elsewhere classified: Secondary | ICD-10-CM

## 2018-12-11 DIAGNOSIS — F419 Anxiety disorder, unspecified: Secondary | ICD-10-CM | POA: Diagnosis not present

## 2018-12-11 DIAGNOSIS — R42 Dizziness and giddiness: Secondary | ICD-10-CM | POA: Diagnosis not present

## 2018-12-11 DIAGNOSIS — G43009 Migraine without aura, not intractable, without status migrainosus: Secondary | ICD-10-CM | POA: Insufficient documentation

## 2018-12-11 DIAGNOSIS — H547 Unspecified visual loss: Secondary | ICD-10-CM

## 2018-12-11 DIAGNOSIS — M7918 Myalgia, other site: Secondary | ICD-10-CM

## 2018-12-11 DIAGNOSIS — R519 Headache, unspecified: Secondary | ICD-10-CM

## 2018-12-11 DIAGNOSIS — G43709 Chronic migraine without aura, not intractable, without status migrainosus: Secondary | ICD-10-CM | POA: Diagnosis not present

## 2018-12-11 DIAGNOSIS — G44021 Chronic cluster headache, intractable: Secondary | ICD-10-CM

## 2018-12-11 DIAGNOSIS — F32A Depression, unspecified: Secondary | ICD-10-CM | POA: Insufficient documentation

## 2018-12-11 DIAGNOSIS — G479 Sleep disorder, unspecified: Secondary | ICD-10-CM

## 2018-12-11 DIAGNOSIS — F329 Major depressive disorder, single episode, unspecified: Secondary | ICD-10-CM

## 2018-12-11 MED ORDER — RIZATRIPTAN BENZOATE 10 MG PO TBDP: 10.0000 mg | ORAL_TABLET | ORAL | 11 refills | Status: AC | PRN

## 2018-12-11 MED ORDER — VENLAFAXINE HCL ER 75 MG PO CP24: 75.0000 mg | ORAL_CAPSULE | Freq: Every day | ORAL | 3 refills | Status: DC

## 2018-12-11 MED ORDER — ONDANSETRON 4 MG PO TBDP: 4.0000 mg | ORAL_TABLET | Freq: Three times a day (TID) | ORAL | 3 refills | Status: DC | PRN

## 2018-12-11 MED ORDER — CYCLOBENZAPRINE HCL 10 MG PO TABS: 10.0000 mg | ORAL_TABLET | Freq: Every day | ORAL | 6 refills | Status: DC

## 2018-12-11 NOTE — Progress Notes (Signed)
[facility]    Provider:  Dr Lucia GaskinsAhern Requesting Provider: Lorre MunroeBaity, Regina W, NP Primary Care Provider:  Lorre MunroeBaity, Regina W, NP  CC:  migraines  Virtual Visit via Video Note  I connected with@ on 12/12/18 at 12:00 PM EDT by a video enabled telemedicine application and verified that I am speaking with the correct person using two identifiers.   I discussed the limitations of evaluation and management by telemedicine and the availability of in person appointments. The patient expressed understanding and agreed to proceed.  Anson FretAhern, Elijio Staples B, MD  HPI:  Lindsay Nicholson is a 33 y.o. female here as requested by Lorre MunroeBaity, Regina W, NP for headaches. Started 8 years ago. Starts in the left back of the head radiates to the shoulder.Also be more left sided (parietal). Shooting sharp pain. It can be severe, she has to stay home, she can't move, pounding and pulsating, nausea and vomited once, light and sound sensitivity. It has lasted 2 weeks but usually 24-72 hours. She has 2 cups of coffee. She has 15 migraine days a month for 6-7 years. They can start in the morning, can be positional, Topamax helps and it was increased to 100mg . Tried Amitriptyline. She has a lot of neck pain, she says it is pretty bad. She was given meloxicam. She is not sleeping well at night. Her mind races at night. Anxiety is an issue. She sits all day. She stares at a computer all day. She has not had eyes checked. She has face numbness. She has dizziness. No aura.  She has had episodes of spots in her vision, vision loss. No other focal neurologic deficits, associated symptoms, inciting events or modifiable factors.  Review of Systems: Patient complains of symptoms per HPI as well as the following symptoms: headaches, anxiety, insomnia, stress, depression. Pertinent negatives and positives per HPI. All others negative.   Social History   Socioeconomic History  . Marital status: Single    Spouse name: Not on file  .  Number of children: 3  . Years of education: Not on file  . Highest education level: Associate degree: occupational, Scientist, product/process developmenttechnical, or vocational program  Occupational History  . Not on file  Social Needs  . Financial resource strain: Not on file  . Food insecurity:    Worry: Not on file    Inability: Not on file  . Transportation needs:    Medical: Not on file    Non-medical: Not on file  Tobacco Use  . Smoking status: Current Every Day Smoker    Packs/day: 0.25    Types: Cigarettes  . Smokeless tobacco: Never Used  Substance and Sexual Activity  . Alcohol use: Yes    Alcohol/week: 0.0 standard drinks    Comment: occasional  . Drug use: No  . Sexual activity: Yes    Birth control/protection: Injection  Lifestyle  . Physical activity:    Days per week: Not on file    Minutes per session: Not on file  . Stress: Not on file  Relationships  . Social connections:    Talks on phone: Not on file    Gets together: Not on file    Attends religious service: Not on file    Active member of club or organization: Not on file    Attends meetings of clubs or organizations: Not on file    Relationship status: Not on file  . Intimate partner violence:    Fear of current or ex partner: Not on file  Emotionally abused: Not on file    Physically abused: Not on file    Forced sexual activity: Not on file  Other Topics Concern  . Not on file  Social History Narrative   Lives at home with her fiance and her children   Left handed   Caffeine: all day long    Family History  Problem Relation Age of Onset  . Diabetes Father   . Hypertension Father   . High Cholesterol Father   . Anesthesia problems Neg Hx   . Cancer Neg Hx   . Heart disease Neg Hx   . Stroke Neg Hx   . Migraines Neg Hx     Past Medical History:  Diagnosis Date  . Anxiety   . Cervicalgia   . SVD (spontaneous vaginal delivery) 09/14/2011    Patient Active Problem List   Diagnosis Date Noted  . Chronic  migraine without aura without status migrainosus, not intractable 12/11/2018  . Anxiety and depression 12/11/2018  . Sleep difficulties 12/11/2018  . GERD (gastroesophageal reflux disease) 08/09/2018  . Chronic shoulder pain 08/09/2018  . Anxiety state 12/03/2014  . Frequent headaches 12/03/2014    Past Surgical History:  Procedure Laterality Date  . LAPAROSCOPIC APPENDECTOMY N/A 11/16/2015   Procedure: APPENDECTOMY LAPAROSCOPIC;  Surgeon: Ovidio Kin, MD;  Location: [facility];  Service: General;  Laterality: N/A;  . SHOULDER FUSION SURGERY      Current Outpatient Medications  Medication Sig Dispense Refill  . Aspirin-Acetaminophen-Caffeine (EXCEDRIN MIGRAINE PO) Take by mouth as needed.    . busPIRone (BUSPAR) 10 MG tablet Take 1 tablet (10 mg total) by mouth 2 (two) times daily. At 8 am and 2 pm 60 tablet 5  . cyclobenzaprine (FLEXERIL) 10 MG tablet Take 1 tablet (10 mg total) by mouth at bedtime. And for neck pain. 30 tablet 6  . diclofenac (VOLTAREN) 75 MG EC tablet Take 1 tablet (75 mg total) by mouth 2 (two) times daily. (Patient not taking: Reported on 12/11/2018) 60 tablet 5  . HYDROcodone-homatropine (HYCODAN) 5-1.5 MG/5ML syrup Take 5 mLs by mouth every 8 (eight) hours as needed for cough. (Patient not taking: Reported on 12/11/2018) 120 mL 0  . hydrOXYzine (ATARAX/VISTARIL) 10 MG tablet Take 1 tablet (10 mg total) by mouth daily as needed. (Patient not taking: Reported on 12/11/2018) 30 tablet 0  . ibuprofen (ADVIL,MOTRIN) 200 MG tablet Take 800 mg by mouth as needed.    . medroxyPROGESTERone Acetate 150 MG/ML SUSY medroxyprogesterone 150 mg/mL intramuscular syringe    . ondansetron (ZOFRAN-ODT) 4 MG disintegrating tablet Take 1 tablet (4 mg total) by mouth every 8 (eight) hours as needed for nausea. Or for dizziness or headache and migraine. 30 tablet 3  . oseltamivir (TAMIFLU) 75 MG capsule Take 1 capsule (75 mg total) by mouth daily. (Patient not taking: Reported on 12/11/2018) 10  capsule 0  . rizatriptan (MAXALT-MLT) 10 MG disintegrating tablet Take 1 tablet (10 mg total) by mouth as needed for migraine. May repeat in 2 hours if needed 9 tablet 11  . SUMAtriptan (IMITREX) 25 MG tablet Take 1 tablet (25 mg total) by mouth every 2 (two) hours as needed for migraine. May repeat in 2 hours if headache persists or recurs. (Patient taking differently: Take 25 mg by mouth every 2 (two) hours as needed for migraine. May repeat in 2 hours if headache persists or recurs. Max 2 tablets per day.) 10 tablet 0  . topiramate (TOPAMAX) 100 MG tablet Take 1 tablet (100  mg total) by mouth daily. 30 tablet 5  . venlafaxine XR (EFFEXOR XR) 75 MG 24 hr capsule Take 1 capsule (75 mg total) by mouth daily with breakfast. 30 capsule 3   No current facility-administered medications for this visit.     Allergies as of 12/11/2018  . (No Known Allergies)    Vitals: There were no vitals taken for this visit. Last Weight:  Wt Readings from Last 1 Encounters:  11/20/18 120 lb 12.8 oz (54.8 kg)   Last Height:   Ht Readings from Last 1 Encounters:  11/20/18 5' 1.25" (1.556 m)     Physical exam: Exam:    Physical exam: Exam: Gen: NAD, conversant      CV: attempted, Could not perform over Web Video  Eyes: Conjunctivae clear without exudates or hemorrhage  Neuro: Detailed Neurologic Exam  Speech:    Speech is normal; fluent and spontaneous with normal comprehension.  Cognition:    The patient is oriented to person, place, and time;     recent and remote memory intact;     language fluent;     normal attention, concentration,     fund of knowledge Cranial Nerves:    The pupils are equal, round, and reactive to light. Attempted, Cannot perform fundoscopic exam. Visual fields are full to finger confrontation. Extraocular movements are intact.  The face is symmetric with normal sensation. The palate elevates in the midline. Hearing intact. Voice is normal. Shoulder shrug is normal.  The tongue has normal motion without fasciculations.   Coordination:    Normal finger to nose  Gait:    Normal native gait  Motor Observation:   no involuntary movements noted. Tone:    Appears normal  Posture:    Posture is normal. normal erect    Strength:    Strength is anti-gravity and symmetric in the upper and lower limbs.      Sensation: intact to LT     Reflex Exam:  DTR's:    Attempted, Could not perform over Web Video   Toes: Attempted Could not perform over Web Video  Clonus:   Attempted, Could not perform over Web Video     Assessment/Plan:  Lindsay Nicholson is a 33 y.o. female here as requested by Lorre Munroe, NP for headaches. Started 8 years ago.  These are likely migraines with a contribution from myofascial cervical pain and insomnia with anxiety.  However given some of her concerning symptoms I do recommend a thorough examination including MRI of the brain.  Migraine prevention: Effexor. May also help with depression/anxiety. She is not taking the Buspar. Acute: Rizatriptan and zofran. May also help with dizziness/vestibular symptoms with or without head pain Myofascial neck pain contributing to cervicalgia and migraines (occipital trigger): Dry needling and flexeril at bedtime(flexeril may also help with sleeping.)  MRI brain due to concerning symptoms of intractable occipital headache, morning headaches, positional headaches,vision changes(spots)  to look for space occupying mass, chiari or intracranial hypertension (pseudotumor).  Follow Up Instructions:   I discussed the assessment and treatment plan with the patient. The patient was provided an opportunity to ask questions and all were answered. The patient agreed with the plan and demonstrated an understanding of the instructions.   The patient was advised to call back or seek an in-person evaluation if the symptoms worsen or if the condition fails to improve as anticipated.  I provided 80 minutes of  non-face-to-face time during this encounter.  Orders Placed This Encounter  Procedures  .  MR BRAIN W WO CONTRAST  . Ambulatory referral to Physical Therapy   Meds ordered this encounter  Medications  . cyclobenzaprine (FLEXERIL) 10 MG tablet    Sig: Take 1 tablet (10 mg total) by mouth at bedtime. And for neck pain.    Dispense:  30 tablet    Refill:  6  . venlafaxine XR (EFFEXOR XR) 75 MG 24 hr capsule    Sig: Take 1 capsule (75 mg total) by mouth daily with breakfast.    Dispense:  30 capsule    Refill:  3  . rizatriptan (MAXALT-MLT) 10 MG disintegrating tablet    Sig: Take 1 tablet (10 mg total) by mouth as needed for migraine. May repeat in 2 hours if needed    Dispense:  9 tablet    Refill:  11  . ondansetron (ZOFRAN-ODT) 4 MG disintegrating tablet    Sig: Take 1 tablet (4 mg total) by mouth every 8 (eight) hours as needed for nausea. Or for dizziness or headache and migraine.    Dispense:  30 tablet    Refill:  3    Cc: Baity, Salvadore Oxford, NP  Naomie Dean, MD  [facility] [address] [place], [place] [address]  Phone [phone] Fax [phone]

## 2018-12-11 NOTE — Telephone Encounter (Signed)
Spoke with patient and updated her med/soc/sx/fam hx, medications/allergies/phamacy. Confirmed pt's DOB.  She states she has had migraines for 8 years. She has tried: ibuprofen, excedrin migraine, tylenol, sumatriptan, topiramate, fioricet, "I've tried a lot", unable to recall others at this time.

## 2018-12-13 ENCOUNTER — Telehealth: Payer: Self-pay | Admitting: Neurology

## 2018-12-13 NOTE — Telephone Encounter (Signed)
[facility] Auth: [id number] (exp. 12/13/18 to 01/11/19) order sent to GI. They will reach out to the pt to schedule.

## 2018-12-25 ENCOUNTER — Other Ambulatory Visit: Payer: Commercial Managed Care - PPO

## 2019-01-05 ENCOUNTER — Ambulatory Visit
Admission: RE | Admit: 2019-01-05 | Discharge: 2019-01-05 | Disposition: A | Discharge status: Home / Self Care | Payer: Commercial Managed Care - PPO | Source: Ambulatory Visit | Attending: Neurology | Admitting: Neurology

## 2019-01-05 ENCOUNTER — Other Ambulatory Visit: Payer: Self-pay

## 2019-01-05 DIAGNOSIS — R51 Headache with orthostatic component, not elsewhere classified: Secondary | ICD-10-CM

## 2019-01-05 DIAGNOSIS — G441 Vascular headache, not elsewhere classified: Secondary | ICD-10-CM

## 2019-01-05 DIAGNOSIS — R42 Dizziness and giddiness: Secondary | ICD-10-CM | POA: Diagnosis not present

## 2019-01-05 DIAGNOSIS — Z3042 Encounter for surveillance of injectable contraceptive: Secondary | ICD-10-CM | POA: Diagnosis not present

## 2019-01-05 DIAGNOSIS — R519 Headache, unspecified: Secondary | ICD-10-CM

## 2019-01-05 DIAGNOSIS — H547 Unspecified visual loss: Secondary | ICD-10-CM

## 2019-01-05 DIAGNOSIS — G44021 Chronic cluster headache, intractable: Secondary | ICD-10-CM

## 2019-01-05 MED ORDER — GADOBENATE DIMEGLUMINE 529 MG/ML IV SOLN
10.0000 mL | Freq: Once | INTRAVENOUS | Status: AC | PRN
  Administered 2019-01-05: 10 mL via INTRAVENOUS

## 2019-01-22 ENCOUNTER — Encounter: Payer: Self-pay | Admitting: Internal Medicine

## 2019-01-22 ENCOUNTER — Other Ambulatory Visit: Payer: Self-pay | Admitting: Neurology

## 2019-01-22 ENCOUNTER — Other Ambulatory Visit: Payer: Self-pay | Admitting: Internal Medicine

## 2019-01-22 DIAGNOSIS — M25512 Pain in left shoulder: Secondary | ICD-10-CM

## 2019-01-22 MED ORDER — VENLAFAXINE HCL ER 150 MG PO CP24: 150.0000 mg | ORAL_CAPSULE | Freq: Every day | ORAL | 11 refills | Status: DC

## 2019-01-23 ENCOUNTER — Other Ambulatory Visit: Payer: Self-pay | Admitting: Neurology

## 2019-01-23 MED ORDER — TIZANIDINE HCL 4 MG PO TABS: ORAL_TABLET | ORAL | 6 refills | Status: DC

## 2019-01-23 NOTE — Telephone Encounter (Signed)
Meloxicam is not on current list, please advise

## 2019-01-25 ENCOUNTER — Ambulatory Visit (INDEPENDENT_AMBULATORY_CARE_PROVIDER_SITE_OTHER): Payer: Commercial Managed Care - PPO | Admitting: Internal Medicine

## 2019-01-25 ENCOUNTER — Encounter: Payer: Self-pay | Admitting: Internal Medicine

## 2019-01-25 DIAGNOSIS — F419 Anxiety disorder, unspecified: Secondary | ICD-10-CM

## 2019-01-25 NOTE — Progress Notes (Signed)
Virtual Visit via Video Note  I connected with Lindsay Nicholson on 01/25/19 at  2:30 PM EDT by a video enabled telemedicine application and verified that I am speaking with the correct person using two identifiers.  Location: Patient: Home Provider: Office   I discussed the limitations of evaluation and management by telemedicine and the availability of in person appointments. The patient expressed understanding and agreed to proceed.  History of Present Illness:  Pt reports she is having issues with anxiety. She was started on Effexor by her neurologist to help with migraines. She was taking Buspar and Hydroxyzine previously but reports it made her nauseated and made her face numb. She has failed Wellbutrin in the past. She denies depression, SI/HI. She is not currently following with a therapist.   Past Medical History:  Diagnosis Date  . Anxiety   . Cervicalgia   . SVD (spontaneous vaginal delivery) 09/14/2011    Current Outpatient Medications  Medication Sig Dispense Refill  . Aspirin-Acetaminophen-Caffeine (EXCEDRIN MIGRAINE PO) Take by mouth as needed.    . busPIRone (BUSPAR) 10 MG tablet Take 1 tablet (10 mg total) by mouth 2 (two) times daily. At 8 am and 2 pm 60 tablet 5  . cyclobenzaprine (FLEXERIL) 10 MG tablet Take 1 tablet (10 mg total) by mouth at bedtime. And for neck pain. 30 tablet 6  . diclofenac (VOLTAREN) 75 MG EC tablet Take 1 tablet (75 mg total) by mouth 2 (two) times daily. (Patient not taking: Reported on 12/11/2018) 60 tablet 5  . HYDROcodone-homatropine (HYCODAN) 5-1.5 MG/5ML syrup Take 5 mLs by mouth every 8 (eight) hours as needed for cough. (Patient not taking: Reported on 12/11/2018) 120 mL 0  . hydrOXYzine (ATARAX/VISTARIL) 10 MG tablet Take 1 tablet (10 mg total) by mouth daily as needed. (Patient not taking: Reported on 12/11/2018) 30 tablet 0  . ibuprofen (ADVIL,MOTRIN) 200 MG tablet Take 800 mg by mouth as needed.    . medroxyPROGESTERone Acetate 150 MG/ML SUSY  medroxyprogesterone 150 mg/mL intramuscular syringe    . meloxicam (MOBIC) 15 MG tablet Take 1 tablet by mouth once daily 90 tablet 0  . ondansetron (ZOFRAN-ODT) 4 MG disintegrating tablet Take 1 tablet (4 mg total) by mouth every 8 (eight) hours as needed for nausea. Or for dizziness or headache and migraine. 30 tablet 3  . oseltamivir (TAMIFLU) 75 MG capsule Take 1 capsule (75 mg total) by mouth daily. (Patient not taking: Reported on 12/11/2018) 10 capsule 0  . rizatriptan (MAXALT-MLT) 10 MG disintegrating tablet Take 1 tablet (10 mg total) by mouth as needed for migraine. May repeat in 2 hours if needed 9 tablet 11  . SUMAtriptan (IMITREX) 25 MG tablet Take 1 tablet (25 mg total) by mouth every 2 (two) hours as needed for migraine. May repeat in 2 hours if headache persists or recurs. (Patient taking differently: Take 25 mg by mouth every 2 (two) hours as needed for migraine. May repeat in 2 hours if headache persists or recurs. Max 2 tablets per day.) 10 tablet 0  . tiZANidine (ZANAFLEX) 4 MG tablet Take 1-2 tablets (4-8mg ) at bedtime. 60 tablet 6  . topiramate (TOPAMAX) 100 MG tablet Take 1 tablet (100 mg total) by mouth daily. 30 tablet 5  . topiramate (TOPAMAX) 25 MG tablet Take 3 tablets by mouth once daily 90 tablet 0  . venlafaxine XR (EFFEXOR-XR) 150 MG 24 hr capsule Take 1 capsule (150 mg total) by mouth daily with breakfast. 90 capsule 11   No  current facility-administered medications for this visit.     No Known Allergies  Family History  Problem Relation Age of Onset  . Diabetes Father   . Hypertension Father   . High Cholesterol Father   . Anesthesia problems Neg Hx   . Cancer Neg Hx   . Heart disease Neg Hx   . Stroke Neg Hx   . Migraines Neg Hx     Social History   Socioeconomic History  . Marital status: Single    Spouse name: Not on file  . Number of children: 3  . Years of education: Not on file  . Highest education level: Associate degree: occupational,  Scientist, product/process developmenttechnical, or vocational program  Occupational History  . Not on file  Social Needs  . Financial resource strain: Not on file  . Food insecurity:    Worry: Not on file    Inability: Not on file  . Transportation needs:    Medical: Not on file    Non-medical: Not on file  Tobacco Use  . Smoking status: Current Every Day Smoker    Packs/day: 0.25    Types: Cigarettes  . Smokeless tobacco: Never Used  Substance and Sexual Activity  . Alcohol use: Yes    Alcohol/week: 0.0 standard drinks    Comment: occasional  . Drug use: No  . Sexual activity: Yes    Birth control/protection: Injection  Lifestyle  . Physical activity:    Days per week: Not on file    Minutes per session: Not on file  . Stress: Not on file  Relationships  . Social connections:    Talks on phone: Not on file    Gets together: Not on file    Attends religious service: Not on file    Active member of club or organization: Not on file    Attends meetings of clubs or organizations: Not on file    Relationship status: Not on file  . Intimate partner violence:    Fear of current or ex partner: Not on file    Emotionally abused: Not on file    Physically abused: Not on file    Forced sexual activity: Not on file  Other Topics Concern  . Not on file  Social History Narrative   Lives at home with her fiance and her children   Left handed   Caffeine: all day long     Constitutional: Denies fever, malaise, fatigue, headache or abrupt weight changes.  Respiratory: Denies difficulty breathing, shortness of breath, cough or sputum production.   Cardiovascular: Denies chest pain, chest tightness, palpitations or swelling in the hands or feet.  Neurological: Denies dizziness, difficulty with memory, difficulty with speech or problems with balance and coordination.  Psych: Pt reports anxiety. Denies  depression, SI/HI.  No other specific complaints in a complete review of systems (except as listed in HPI  above).   Wt Readings from Last 3 Encounters:  11/20/18 120 lb 12.8 oz (54.8 kg)  08/09/18 125 lb (56.7 kg)  03/02/18 125 lb (56.7 kg)    General: Appears her stated age, well developed, well nourished in NAD. Pulmonary/Chest: Normal effort. No respiratory distress.  Neurological: Alert and oriented.   Psychiatric: Mood and affect normal. Behavior is normal. Judgment and thought content normal.     BMET    Component Value Date/Time   NA 139 08/09/2018 1450   K 4.1 08/09/2018 1450   CL 110 08/09/2018 1450   CO2 22 08/09/2018 1450   GLUCOSE  81 08/09/2018 1450   BUN 17 08/09/2018 1450   CREATININE 1.05 08/09/2018 1450   CALCIUM 9.9 08/09/2018 1450   GFRNONAA >60 11/16/2015 0832   GFRAA >60 11/16/2015 0832    Lipid Panel     Component Value Date/Time   CHOL 166 08/09/2018 1450   TRIG 254.0 (H) 08/09/2018 1450   HDL 31.80 (L) 08/09/2018 1450   CHOLHDL 5 08/09/2018 1450   VLDL 50.8 (H) 08/09/2018 1450   LDLCALC 97 08/08/2017 1501    CBC    Component Value Date/Time   WBC 6.3 08/09/2018 1450   RBC 4.38 08/09/2018 1450   HGB 13.7 08/09/2018 1450   HCT 40.6 08/09/2018 1450   PLT 281.0 08/09/2018 1450   MCV 92.9 08/09/2018 1450   MCH 30.5 11/16/2015 0832   MCHC 33.8 08/09/2018 1450   RDW 13.1 08/09/2018 1450   LYMPHSABS 0.9 11/16/2015 0832   MONOABS 0.8 11/16/2015 0832   EOSABS 0.0 11/16/2015 0832   BASOSABS 0.0 11/16/2015 0832    Hgb A1C No results found for: HGBA1C      Assessment and Plan:  Anxiety:  Support offered today Continue Effexor Will refer to psychiatry for further evaluation/medication management  Follow Up Instructions:    I discussed the assessment and treatment plan with the patient. The patient was provided an opportunity to ask questions and all were answered. The patient agreed with the plan and demonstrated an understanding of the instructions.   The patient was advised to call back or seek an in-person evaluation if the  symptoms worsen or if the condition fails to improve as anticipated.    Nicki Reaper, NP

## 2019-01-25 NOTE — Patient Instructions (Signed)

## 2019-02-01 ENCOUNTER — Other Ambulatory Visit: Payer: Self-pay | Admitting: Neurology

## 2019-02-01 ENCOUNTER — Telehealth: Payer: Self-pay | Admitting: Neurology

## 2019-02-01 MED ORDER — METHYLPREDNISOLONE 4 MG PO TBPK: ORAL_TABLET | ORAL | 1 refills | Status: DC

## 2019-02-01 NOTE — Telephone Encounter (Signed)
Tyan from [facility] called needing to know exactly how the pt is to take her methylPREDNISolone (MEDROL DOSEPAK) 4 MG TBPK tablet  Please advise.

## 2019-02-01 NOTE — Telephone Encounter (Signed)
I called Tyan back at [facility] pharmacy and was transferred to another staff member. I clarified per Dr. Lucia Gaskins that the order is a medrol dose pack 4 mg tablet and patient should take each day's pills together in the morning with food (rather than spreading out over the day) tapered over 6 day period. She verbalized understanding and stated the dose pack included a taper of 6,5,4,3,2,1.

## 2019-02-06 ENCOUNTER — Ambulatory Visit (INDEPENDENT_AMBULATORY_CARE_PROVIDER_SITE_OTHER): Payer: Commercial Managed Care - PPO | Admitting: Internal Medicine

## 2019-02-06 ENCOUNTER — Encounter: Payer: Self-pay | Admitting: Internal Medicine

## 2019-02-06 ENCOUNTER — Ambulatory Visit (INDEPENDENT_AMBULATORY_CARE_PROVIDER_SITE_OTHER)
Admission: RE | Admit: 2019-02-06 | Discharge: 2019-02-06 | Disposition: A | Discharge status: Home / Self Care | Payer: Commercial Managed Care - PPO | Source: Ambulatory Visit | Attending: Internal Medicine | Admitting: Internal Medicine

## 2019-02-06 ENCOUNTER — Other Ambulatory Visit: Payer: Self-pay

## 2019-02-06 ENCOUNTER — Other Ambulatory Visit (INDEPENDENT_AMBULATORY_CARE_PROVIDER_SITE_OTHER): Payer: Commercial Managed Care - PPO

## 2019-02-06 DIAGNOSIS — G43011 Migraine without aura, intractable, with status migrainosus: Secondary | ICD-10-CM

## 2019-02-06 DIAGNOSIS — R197 Diarrhea, unspecified: Secondary | ICD-10-CM | POA: Diagnosis not present

## 2019-02-06 DIAGNOSIS — M542 Cervicalgia: Secondary | ICD-10-CM

## 2019-02-06 DIAGNOSIS — R110 Nausea: Secondary | ICD-10-CM | POA: Diagnosis not present

## 2019-02-06 DIAGNOSIS — M79602 Pain in left arm: Secondary | ICD-10-CM

## 2019-02-06 DIAGNOSIS — R42 Dizziness and giddiness: Secondary | ICD-10-CM

## 2019-02-06 DIAGNOSIS — F5101 Primary insomnia: Secondary | ICD-10-CM

## 2019-02-06 DIAGNOSIS — R61 Generalized hyperhidrosis: Secondary | ICD-10-CM

## 2019-02-06 MED ORDER — TRAZODONE HCL 50 MG PO TABS: 25.0000 mg | ORAL_TABLET | Freq: Every evening | ORAL | 0 refills | Status: DC | PRN

## 2019-02-06 NOTE — Patient Instructions (Signed)
Nausea, Adult Nausea is feeling sick to your stomach or feeling that you are about to throw up (vomit). Feeling sick to your stomach is usually not serious, but it may be an early sign of a more serious medical problem. As you feel sicker to your stomach, you may throw up. If you throw up, or if you are not able to drink enough fluids, there is a risk that you may lose too much water in your body (get dehydrated). If you lose too much water in your body, you may:  Feel tired.  Feel thirsty.  Have a dry mouth.  Have cracked lips.  Go pee (urinate) less often. Older adults and people who have other diseases or a weak body defense system (immune system) have a higher risk of losing too much water in the body. The main goals of treating this condition are:  To relieve your nausea.  To ensure your nausea occurs less often.  To prevent throwing up and losing too much fluid. Follow these instructions at home: Watch your symptoms for any changes. Tell your doctor about them. Follow these instructions as told by your doctor. Eating and drinking      Take an ORS (oral rehydration solution). This is a drink that is sold at pharmacies and stores.  Drink clear fluids in small amounts as you are able. These include: ? Water. ? Ice chips. ? Fruit juice that has water added (diluted fruit juice). ? Low-calorie sports drinks.  Eat bland, easy-to-digest foods in small amounts as you are able, such as: ? Bananas. ? Applesauce. ? Rice. ? Low-fat (lean) meats. ? Toast. ? Crackers.  Avoid drinking fluids that have a lot of sugar or caffeine in them. This includes energy drinks, sports drinks, and soda.  Avoid alcohol.  Avoid spicy or fatty foods. General instructions  Take over-the-counter and prescription medicines only as told by your doctor.  Rest at home while you get better.  Drink enough fluid to keep your pee (urine) pale yellow.  Take slow and deep breaths when you feel  sick to your stomach.  Avoid food or things that have strong smells.  Wash your hands often with soap and water. If you cannot use soap and water, use hand sanitizer.  Make sure that all people in your home wash their hands well and often.  Keep all follow-up visits as told by your doctor. This is important. Contact a doctor if:  You feel sicker to your stomach.  You feel sick to your stomach for more than 2 days.  You throw up.  You are not able to drink fluids without throwing up.  You have new symptoms.  You have a fever.  You have a headache.  You have muscle cramps.  You have a rash.  You have pain while peeing.  You feel light-headed or dizzy. Get help right away if:  You have pain in your chest, neck, arm, or jaw.  You feel very weak or you pass out (faint).  You have throw up that is bright red or looks like coffee grounds.  You have bloody or black poop (stools) or poop that looks like tar.  You have a very bad headache, a stiff neck, or both.  You have very bad pain, cramping, or bloating in your belly (abdomen).  You have trouble breathing or you are breathing very quickly.  Your heart is beating very quickly.  Your skin feels cold and clammy.  You feel confused.    You have signs of losing too much water in your body, such as: ? Dark pee, very little pee, or no pee. ? Cracked lips. ? Dry mouth. ? Sunken eyes. ? Sleepiness. ? Weakness. These symptoms may be an emergency. Do not wait to see if the symptoms will go away. Get medical help right away. Call your local emergency services (911 in the [place] Do not drive yourself to the hospital. Summary  Nausea is feeling sick to your stomach or feeling that you are about to throw up (vomit).  If you throw up, or if you are not able to drink enough fluids, there is a risk that you may lose too much water in your body (get dehydrated).  Eat and drink what your doctor tells you. Take  over-the-counter and prescription medicines only as told by your doctor.  Contact a doctor right away if your symptoms get worse or you have new symptoms.  Keep all follow-up visits as told by your doctor. This is important. This information is not intended to replace advice given to you by your health care provider. Make sure you discuss any questions you have with your health care provider. Document Released: 08/19/2011 Document Revised: 02/07/2018 Document Reviewed: 02/07/2018 Elsevier Interactive Patient Education  2019 [employer]vier Inc.  

## 2019-02-06 NOTE — Progress Notes (Signed)
Virtual Visit via Video Note  I connected with Lindsay Nicholson on 02/06/19 at 10:45 AM EDT by a video enabled telemedicine application and verified that I am speaking with the correct person using two identifiers.  Location: Patient: Home Provider: Office   I discussed the limitations of evaluation and management by telemedicine and the availability of in person appointments. The patient expressed understanding and agreed to proceed.  History of Present Illness:  Pt reports multiple complaints.  She reports nausea, dizziness, diarrhea and night sweats. She reports this started 5 days ago but reports she had a similar episode 3 weeks ago. She reports the dizziness is a sense of imbalance, not that the room is spinning. She denies syncopal episodes. She denies vomiting. She reports abdominal pain right before a bowel movement that is relieved by having the BM. She denies blood in her stool. She reports night sweats at night but nothing during the day. She denies fever, chills or body aches. She has tried Zofran and Meclizine with minimal relief. She has not had sick contacts with similar symptoms. She denies recent changes in diet.  She also reports trouble sleeping. She is having trouble falling asleep and staying asleep. She reports she has never taken any sleep aids although I see Ambien prescribed as a historical medicine. She would like to know what she could take for sleep.   She also reports persistent neck pain, left arm pain. This has been a persistent issue. She has taken NSAID's and Zanaflex without much relief. She has an appt to start PT tomorrow. She has never had an xray of her neck. She recently had a MRI of her brain for evaluation of migraines, but it did not capture the cervical spine.    Past Medical History:  Diagnosis Date  . Anxiety   . Cervicalgia   . SVD (spontaneous vaginal delivery) 09/14/2011    Current Outpatient Medications  Medication Sig Dispense Refill  .  Aspirin-Acetaminophen-Caffeine (EXCEDRIN MIGRAINE PO) Take by mouth as needed.    . cyclobenzaprine (FLEXERIL) 10 MG tablet Take 1 tablet (10 mg total) by mouth at bedtime. And for neck pain. 30 tablet 6  . diclofenac (VOLTAREN) 75 MG EC tablet Take 1 tablet (75 mg total) by mouth 2 (two) times daily. 60 tablet 5  . hydrOXYzine (ATARAX/VISTARIL) 10 MG tablet Take 1 tablet (10 mg total) by mouth daily as needed. 30 tablet 0  . ibuprofen (ADVIL,MOTRIN) 200 MG tablet Take 800 mg by mouth as needed.    . medroxyPROGESTERone Acetate 150 MG/ML SUSY medroxyprogesterone 150 mg/mL intramuscular syringe    . meloxicam (MOBIC) 15 MG tablet Take 1 tablet by mouth once daily 90 tablet 0  . ondansetron (ZOFRAN-ODT) 4 MG disintegrating tablet Take 1 tablet (4 mg total) by mouth every 8 (eight) hours as needed for nausea. Or for dizziness or headache and migraine. 30 tablet 3  . rizatriptan (MAXALT-MLT) 10 MG disintegrating tablet Take 1 tablet (10 mg total) by mouth as needed for migraine. May repeat in 2 hours if needed 9 tablet 11  . SUMAtriptan (IMITREX) 25 MG tablet Take 1 tablet (25 mg total) by mouth every 2 (two) hours as needed for migraine. May repeat in 2 hours if headache persists or recurs. (Patient taking differently: Take 25 mg by mouth every 2 (two) hours as needed for migraine. May repeat in 2 hours if headache persists or recurs. Max 2 tablets per day.) 10 tablet 0  . tiZANidine (ZANAFLEX) 4 MG tablet  Take 1-2 tablets (4-8mg ) at bedtime. 60 tablet 6  . topiramate (TOPAMAX) 100 MG tablet Take 1 tablet (100 mg total) by mouth daily. 30 tablet 5  . topiramate (TOPAMAX) 25 MG tablet Take 3 tablets by mouth once daily 90 tablet 0  . venlafaxine XR (EFFEXOR-XR) 150 MG 24 hr capsule Take 1 capsule (150 mg total) by mouth daily with breakfast. 90 capsule 11  . methylPREDNISolone (MEDROL DOSEPAK) 4 MG TBPK tablet Take pills together in the morning with food for 6 days (Patient not taking: Reported on  02/06/2019) 21 tablet 1  . traZODone (DESYREL) 50 MG tablet Take 0.5-1 tablets (25-50 mg total) by mouth at bedtime as needed for sleep. 30 tablet 0   No current facility-administered medications for this visit.     No Known Allergies  Family History  Problem Relation Age of Onset  . Diabetes Father   . Hypertension Father   . High Cholesterol Father   . Anesthesia problems Neg Hx   . Cancer Neg Hx   . Heart disease Neg Hx   . Stroke Neg Hx   . Migraines Neg Hx     Social History   Socioeconomic History  . Marital status: Single    Spouse name: Not on file  . Number of children: 3  . Years of education: Not on file  . Highest education level: Associate degree: occupational, Scientist, product/process development, or vocational program  Occupational History  . Not on file  Social Needs  . Financial resource strain: Not on file  . Food insecurity:    Worry: Not on file    Inability: Not on file  . Transportation needs:    Medical: Not on file    Non-medical: Not on file  Tobacco Use  . Smoking status: Current Every Day Smoker    Packs/day: 0.25    Types: Cigarettes  . Smokeless tobacco: Never Used  Substance and Sexual Activity  . Alcohol use: Yes    Alcohol/week: 0.0 standard drinks    Comment: occasional  . Drug use: No  . Sexual activity: Yes    Birth control/protection: Injection  Lifestyle  . Physical activity:    Days per week: Not on file    Minutes per session: Not on file  . Stress: Not on file  Relationships  . Social connections:    Talks on phone: Not on file    Gets together: Not on file    Attends religious service: Not on file    Active member of club or organization: Not on file    Attends meetings of clubs or organizations: Not on file    Relationship status: Not on file  . Intimate partner violence:    Fear of current or ex partner: Not on file    Emotionally abused: Not on file    Physically abused: Not on file    Forced sexual activity: Not on file  Other  Topics Concern  . Not on file  Social History Narrative   Lives at home with her fiance and her children   Left handed   Caffeine: all day long     Constitutional: Pt reports intermittent headaches. Denies fever, malaise, fatigue, or abrupt weight changes.  HEENT: Pt reports runny nose, nasal congestion. Denies eye pain, eye redness, ear pain, ringing in the ears, wax buildup, bloody nose, or sore throat. Respiratory: Denies difficulty breathing, shortness of breath, cough or sputum production.   Cardiovascular: Denies chest pain, chest tightness, palpitations or swelling  in the hands or feet.  Gastrointestinal: Pt reports abdominal pain, nausea, diarrhea. Denies bloating, constipation, or blood in the stool.  GU: Denies urgency, frequency, pain with urination, burning sensation, blood in urine, odor or discharge. Musculoskeletal: Pt reports neck and left arm pain. Denies decrease in range of motion, difficulty with gait, muscle pain or joint swelling.  Skin: Denies redness, rashes, lesions or ulcercations.  Neurological: Pt reports dizziness. Denies difficulty with memory, difficulty with speech or problems with balance and coordination.  Psych: Pt has a history of anxiety. Denies depression, SI/HI.  No other specific complaints in a complete review of systems (except as listed in HPI above).   Wt Readings from Last 3 Encounters:  11/20/18 120 lb 12.8 oz (54.8 kg)  08/09/18 125 lb (56.7 kg)  03/02/18 125 lb (56.7 kg)    General: Appears her stated age, well developed, well nourished in NAD. Skin: Warm, dry and intact. No rashes noted. HEENT: Head: normal shape and size;  Pulmonary/Chest: Normal effort. No respiratory distress.  Abdomen: No obvious distention or masses noted. . Musculoskeletal: Normal flexion, extension and rotation of the cervical spine.  Neurological: Alert and oriented.  Coordination normal.    BMET    Component Value Date/Time   NA 139 08/09/2018 1450    K 4.1 08/09/2018 1450   CL 110 08/09/2018 1450   CO2 22 08/09/2018 1450   GLUCOSE 81 08/09/2018 1450   BUN 17 08/09/2018 1450   CREATININE 1.05 08/09/2018 1450   CALCIUM 9.9 08/09/2018 1450   GFRNONAA >60 11/16/2015 0832   GFRAA >60 11/16/2015 0832    Lipid Panel     Component Value Date/Time   CHOL 166 08/09/2018 1450   TRIG 254.0 (H) 08/09/2018 1450   HDL 31.80 (L) 08/09/2018 1450   CHOLHDL 5 08/09/2018 1450   VLDL 50.8 (H) 08/09/2018 1450   LDLCALC 97 08/08/2017 1501    CBC    Component Value Date/Time   WBC 6.3 08/09/2018 1450   RBC 4.38 08/09/2018 1450   HGB 13.7 08/09/2018 1450   HCT 40.6 08/09/2018 1450   PLT 281.0 08/09/2018 1450   MCV 92.9 08/09/2018 1450   MCH 30.5 11/16/2015 0832   MCHC 33.8 08/09/2018 1450   RDW 13.1 08/09/2018 1450   LYMPHSABS 0.9 11/16/2015 0832   MONOABS 0.8 11/16/2015 0832   EOSABS 0.0 11/16/2015 0832   BASOSABS 0.0 11/16/2015 0832    Hgb A1C No results found for: HGBA1C      Assessment and Plan:  Nausea, Dizziness, Abdominal Pain, Diarrhea, Night Sweats:  Encouraged her to drink plenty of fluids and get plenty of rest Will check CBC, CMET, TSH and serum HCG  Neck Pain, Left Arm Pain:  Xray cervical spine to assess for bulging disc Continue to work with PT She will get the Medrol Dose pack and start- advised her to avoid NSAID's Continue Zanaflex prn Encouraged stretching exercises  Insomnia:  Will trial Trazadone, eRx sent to pharmacy  Follow Up Instructions:    I discussed the assessment and treatment plan with the patient. The patient was provided an opportunity to ask questions and all were answered. The patient agreed with the plan and demonstrated an understanding of the instructions.   The patient was advised to call back or seek an in-person evaluation if the symptoms worsen or if the condition fails to improve as anticipated.     Nicki Reaperegina Anecia Nusbaum, NP

## 2019-02-07 ENCOUNTER — Ambulatory Visit: Payer: Commercial Managed Care - PPO | Attending: Neurology

## 2019-02-07 ENCOUNTER — Encounter: Payer: Self-pay | Admitting: Internal Medicine

## 2019-02-07 ENCOUNTER — Other Ambulatory Visit: Payer: Self-pay

## 2019-02-07 DIAGNOSIS — M25512 Pain in left shoulder: Secondary | ICD-10-CM | POA: Insufficient documentation

## 2019-02-07 DIAGNOSIS — G43709 Chronic migraine without aura, not intractable, without status migrainosus: Secondary | ICD-10-CM | POA: Diagnosis present

## 2019-02-07 DIAGNOSIS — M7918 Myalgia, other site: Secondary | ICD-10-CM | POA: Insufficient documentation

## 2019-02-07 DIAGNOSIS — M62838 Other muscle spasm: Secondary | ICD-10-CM | POA: Diagnosis present

## 2019-02-07 DIAGNOSIS — R293 Abnormal posture: Secondary | ICD-10-CM | POA: Insufficient documentation

## 2019-02-07 DIAGNOSIS — G8929 Other chronic pain: Secondary | ICD-10-CM

## 2019-02-07 DIAGNOSIS — M542 Cervicalgia: Secondary | ICD-10-CM | POA: Diagnosis present

## 2019-02-07 LAB — CBC
HCT: 39.3 % (ref 36.0–46.0)
Hemoglobin: 13.4 g/dL (ref 12.0–15.0)
MCHC: 34.1 g/dL (ref 30.0–36.0)
MCV: 93.2 fl (ref 78.0–100.0)
Platelets: 269 10*3/uL (ref 150.0–400.0)
RBC: 4.22 Mil/uL (ref 3.87–5.11)
RDW: 15.1 % (ref 11.5–15.5)
WBC: 7.2 10*3/uL (ref 4.0–10.5)

## 2019-02-07 LAB — COMPREHENSIVE METABOLIC PANEL
ALT: 17 U/L (ref 0–35)
AST: 19 U/L (ref 0–37)
Albumin: 4.5 g/dL (ref 3.5–5.2)
Alkaline Phosphatase: 70 U/L (ref 39–117)
BUN: 11 mg/dL (ref 6–23)
CO2: 22 mEq/L (ref 19–32)
Calcium: 9.5 mg/dL (ref 8.4–10.5)
Chloride: 110 mEq/L (ref 96–112)
Creatinine, Ser: 0.81 mg/dL (ref 0.40–1.20)
GFR: 81.12 mL/min (ref >60.00)
Glucose, Bld: 86 mg/dL (ref 70–99)
Potassium: 3.8 mEq/L (ref 3.5–5.1)
Sodium: 141 mEq/L (ref 135–145)
Total Bilirubin: 0.3 mg/dL (ref 0.2–1.2)
Total Protein: 7.6 g/dL (ref 6.0–8.3)

## 2019-02-07 LAB — TSH: TSH: 0.93 u[IU]/mL (ref 0.35–4.50)

## 2019-02-07 NOTE — Therapy (Signed)
[facility] [address] [place], [place], [address] Phone: [phone]   Fax:  [phone]  Physical Therapy Evaluation  Patient Details  Name: Lindsay Nicholson MRN: [id number] Date of Birth: 03-22-1986 Referring Provider (PT): Naomie Dean, MD    Encounter Date: 02/07/2019  PT End of Session - 02/07/19 0825    Visit Number  1    Number of Visits  12    Date for PT Re-Evaluation  03/23/19    Authorization Type  [employer]    PT Start Time  0825    PT Stop Time  0908    PT Time Calculation (min)  43 min    Activity Tolerance  Patient tolerated treatment well    Behavior During Therapy  [facility] for tasks assessed/performed       Past Medical History:  Diagnosis Date  . Anxiety   . Cervicalgia   . SVD (spontaneous vaginal delivery) 09/14/2011    Past Surgical History:  Procedure Laterality Date  . LAPAROSCOPIC APPENDECTOMY N/A 11/16/2015   Procedure: APPENDECTOMY LAPAROSCOPIC;  Surgeon: Ovidio Kin, MD;  Location: [facility];  Service: General;  Laterality: N/A;  . SHOULDER FUSION SURGERY      There were no vitals filed for this visit.   Subjective Assessment - 02/07/19 0833    Subjective  She reports 8 year Hx of headaches with pain and dizziness/ nausea.    No specific triggers per pt.   Starts on LT side and moves to shoulder.    She has in past month or 2  with 3-4 headaches a week.     She has 2 episodes of bad headaches per week.      Limitations  --   when bad unable to work or do home tasks and need to lye down and lights bother.    How long can you sit comfortably?  As needed    How long can you stand comfortably?  as needed    How long can you walk comfortably?  as needed    Diagnostic tests  Xrays:  not sure     Patient Stated Goals  Have fewer headaches    Currently in Pain?  Yes    Pain Score  4     Pain Location  Shoulder    Pain Orientation  Left    Pain Descriptors / Indicators  Shooting;Aching    Pain Type  Chronic  pain    Pain Radiating Towards   to medial LT elbow and to palm of hand     Pain Onset  More than a month ago    Pain Frequency  Intermittent    Aggravating Factors   Nothing specifically    Pain Relieving Factors  massage, thing really helps,         [facility] PT Assessment - 02/07/19 0001      Assessment   Medical Diagnosis  chronic migraine and myofascial pain syndrome    Referring Provider (PT)  Naomie Dean, MD     Onset Date/Surgical Date  --   8 years ago   Next MD Visit  As needed    Prior Therapy  Not for neck /headache      Precautions   Precautions  None      Restrictions   Weight Bearing Restrictions  No      Balance Screen   Has the patient fallen in the past 6 months  No      Prior Function  Level of Independence  Independent    Vocation  Full time employment    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       Cognition   Overall Cognitive Status  Within Functional Limits for tasks assessed      Observation/Other Assessments   Focus on Therapeutic Outcomes (FOTO)   44% limited      Posture/Postural Control   Posture Comments  forward head and rounded shoulders.       ROM / Strength   AROM / PROM / Strength  AROM;PROM;Strength      AROM   AROM Assessment Site  Cervical    Cervical Flexion  50    Cervical Extension  45     Cervical - Right Side Bend  35    Cervical - Left Side Bend  32    Cervical - Right Rotation  40    Cervical - Left Rotation  52      PROM   Overall PROM Comments  LT shoulder Normal though limited actively due to pain.       Strength   Overall Strength Comments  WNL      Special Tests   Other special tests  + impingement Lt anterior shoulder      Ambulation/Gait   Gait Comments  Normal                 Objective measurements completed on examination: See above findings.              PT Education - 02/07/19 [phone]    Education Details  POC , HEP    Person(s) Educated  Patient    Methods   Explanation    Comprehension  Verbalized understanding       PT Short Term Goals - 02/07/19 0914      PT SHORT TERM GOAL #1   Title  She will demo understanding of good posture    Time  2    Period  Weeks    Status  New      PT SHORT TERM GOAL #2   Title  She will demo independence with  initial HEp     Time  2    Period  Weeks    Status  New      PT SHORT TERM GOAL #3   Title  She will improve cervical rotation to 55 degrees bilaterally    Time  2    Period  Weeks    Status  New        PT Long Term Goals - 02/07/19 0916      PT LONG TERM GOAL #1   Title  She will be indpendent with all HEp issued    Time  6    Period  Weeks    Status  New      PT LONG TERM GOAL #2   Title  Active cervical ROM will be WNL with 70 degrees of rotation    Time  6    Period  Weeks    Status  New      PT LONG TERM GOAL #3   Title  She will report headaches decreased to 2 per week    Time  6    Period  Weeks    Status  New      PT LONG TERM GOAL #4   Title  She will report Lt shoulder pain de creased 50% or more with use  Time  6    Period  Weeks    Status  New      PT LONG TERM GOAL #5   Title  She will demo full actie LT shoulder ROM with 1-2 max pain    Time  6    Period  Weeks    Status  New             Plan - 02/07/19 0919    Clinical Impression Statement  MS Yow presents with chronic nerck and LT shoulder pain and headaches. She demo decr AROM LT shoulder with occasional pain shooting into Lt elbow and palm. She may have impingement of LT shoulder.   Cervical ROM decreased due to pain.  Foreward head posture.   She should benefit from skilled PT and commitment to HEP.    Stability/Clinical Decision Making  Stable/Uncomplicated    Clinical Decision Making  Low    Rehab Potential  Good    PT Frequency  2x / week    PT Duration  6 weeks    PT Treatment/Interventions  Iontophoresis 4mg /ml Dexamethasone;Moist Heat;Cryotherapy;Ultrasound;Therapeutic  exercise;Patient/family education;Manual techniques;Dry needling;Passive range of motion;Taping    PT Next Visit Plan  Modalities , shoulder HEP,  manual , dry needle,     PT Home Exercise Plan  scapula and cervical retraction, cervical rotation    Consulted and Agree with Plan of Care  Patient       Patient will benefit from skilled therapeutic intervention in order to improve the following deficits and impairments:  Dizziness, Pain, Postural dysfunction, Increased muscle spasms, Decreased activity tolerance, Decreased range of motion, Impaired UE functional use  Visit Diagnosis: Chronic migraine without aura without status migrainosus, not intractable - Plan: PT plan of care cert/re-cert  Myofascial pain syndrome, cervical - Plan: PT plan of care cert/re-cert  Cervicalgia - Plan: PT plan of care cert/re-cert  Other muscle spasm - Plan: PT plan of care cert/re-cert  Chronic left shoulder pain - Plan: PT plan of care cert/re-cert  Abnormal posture - Plan: PT plan of care cert/re-cert     Problem List Patient Active Problem List   Diagnosis Date Noted  . Chronic migraine without aura without status migrainosus, not intractable 12/11/2018  . Sleep difficulties 12/11/2018  . GERD (gastroesophageal reflux disease) 08/09/2018  . Chronic shoulder pain 08/09/2018  . Anxiety state 12/03/2014  . Frequent headaches 12/03/2014    Caprice Red  PT 02/07/2019, 9:27 AM  [facility] [address] [place], [place], [address] Phone: [phone]   Fax:  [phone]  Name: Elahni Abdelkader MRN: [id number] Date of Birth: October 29, 1985

## 2019-02-07 NOTE — Patient Instructions (Signed)
Flexion: Cervical OA With AA Rotation Lock    Rotate head to right as far as possible without pain. Look down, tuck chin, and hold _5-10__ seconds. Repeat _2-3___ times per set. Do 1____ sets per session. Do _5-6___ sessions per day.  Copyright  VHI. All rights reserved.  Flexibility: Neck Retraction    Pull head straight back, keeping eyes and jaw level. Repeat __2-3__ times per set. Do ____ sets per session. Do _5-6___ sessions per day.  http://orth.exer.us/345   Copyright  VHI. All rights reserved.  Shoulder (Scapula) Retraction    Pull shoulders back, squeezing shoulder blades together. Repeat _2-3___ times per session. Do _many___ sessions per week. Position: Standing   Copyright  VHI. All rights reserved.

## 2019-02-07 NOTE — Addendum Note (Signed)
Addended by: Alvina Chou on: 02/07/2019 09:46 AM   Modules accepted: Orders

## 2019-02-10 MED ORDER — BACLOFEN 10 MG PO TABS: 10.0000 mg | ORAL_TABLET | Freq: Two times a day (BID) | ORAL | 0 refills | Status: DC

## 2019-02-15 ENCOUNTER — Other Ambulatory Visit: Payer: Self-pay

## 2019-02-15 ENCOUNTER — Ambulatory Visit: Payer: Commercial Managed Care - PPO | Admitting: Licensed Clinical Social Worker

## 2019-02-19 ENCOUNTER — Ambulatory Visit: Payer: Commercial Managed Care - PPO | Attending: Neurology | Admitting: Physical Therapy

## 2019-02-19 ENCOUNTER — Encounter: Payer: Self-pay | Admitting: Physical Therapy

## 2019-02-19 ENCOUNTER — Other Ambulatory Visit: Payer: Self-pay

## 2019-02-19 DIAGNOSIS — M542 Cervicalgia: Secondary | ICD-10-CM | POA: Insufficient documentation

## 2019-02-19 DIAGNOSIS — M62838 Other muscle spasm: Secondary | ICD-10-CM | POA: Diagnosis present

## 2019-02-19 DIAGNOSIS — G43709 Chronic migraine without aura, not intractable, without status migrainosus: Secondary | ICD-10-CM

## 2019-02-19 DIAGNOSIS — M25512 Pain in left shoulder: Secondary | ICD-10-CM | POA: Insufficient documentation

## 2019-02-19 DIAGNOSIS — R293 Abnormal posture: Secondary | ICD-10-CM | POA: Diagnosis present

## 2019-02-19 DIAGNOSIS — M7918 Myalgia, other site: Secondary | ICD-10-CM | POA: Diagnosis present

## 2019-02-19 DIAGNOSIS — G8929 Other chronic pain: Secondary | ICD-10-CM

## 2019-02-19 NOTE — Therapy (Signed)
[place], [place], [address] Phone: [phone]   Fax:  [phone]  Physical Therapy Treatment  Patient Details  Name: Lindsay Nicholson MRN: [id number] Date of Birth: [birth year] Referring Provider (PT): Sarina Ill, MD    Encounter Date: 02/19/2019  PT End of Session - 02/19/19 0905    Visit Number  2    Number of Visits  12    Date for PT Re-Evaluation  03/23/19    Authorization Type  UHC    PT Start Time  0903    PT Stop Time  0944    PT Time Calculation (min)  41 min    Activity Tolerance  Patient tolerated treatment well    Behavior During Therapy  [facility] for tasks assessed/performed       Past Medical History:  Diagnosis Date  . Anxiety   . Cervicalgia   . SVD (spontaneous vaginal delivery) 09/14/2011    Past Surgical History:  Procedure Laterality Date  . LAPAROSCOPIC APPENDECTOMY N/A 11/16/2015   Procedure: APPENDECTOMY LAPAROSCOPIC;  Surgeon: Alphonsa Overall, MD;  Location: [facility];  Service: General;  Laterality: N/A;  . SHOULDER FUSION SURGERY      There were no vitals filed for this visit.  Subjective Assessment - 02/19/19 0905    Subjective  Mild Lt Occipital region HA this morning    Currently in Pain?  Yes    Pain Score  3     Pain Location  Head    Pain Orientation  Left    Pain Descriptors / Indicators  Headache                       [facility] Adult PT Treatment/Exercise - 02/19/19 0001      Exercises   Exercises  Neck      Neck Exercises: Machines for Strengthening   UBE (Upper Arm Bike)  retro 3 min      Neck Exercises: Supine   Other Supine Exercise  scapular retraction      Manual Therapy   Manual Therapy  Joint mobilization;Soft tissue mobilization;Taping    Joint Mobilization  prone thoracic & rib mobilization    Soft tissue mobilization  Lt upper trap, Lt suboccipitals    Kinesiotex  Facilitate Muscle      Kinesiotix   Facilitate Muscle   scapular  retraction      Neck Exercises: Stretches   Other Neck Stretches  door chest stretch    Other Neck Stretches  open book       Trigger Point Dry Needling - 02/19/19 0001    Consent Given?  Yes    Education Handout Provided  No   verbal education   Muscles Treated Head and Neck  Upper trapezius;Suboccipitals    Other Dry Needling  suboccipitals Left    Upper Trapezius Response  Twitch reponse elicited;Palpable increased muscle length   Left          PT Education - 02/19/19 0932    Education Details  TPDN & expected outcomes, types of HA, thoracic posture support    Person(s) Educated  Patient    Methods  Explanation    Comprehension  Verbalized understanding;Need further instruction       PT Short Term Goals - 02/07/19 0914      PT SHORT TERM GOAL #1   Title  She will demo understanding of good posture    Time  2    Period  Weeks    Status  New      PT SHORT TERM GOAL #2   Title  She will demo independence with  initial HEp     Time  2    Period  Weeks    Status  New      PT SHORT TERM GOAL #3   Title  She will improve cervical rotation to 55 degrees bilaterally    Time  2    Period  Weeks    Status  New        PT Long Term Goals - 02/07/19 0916      PT LONG TERM GOAL #1   Title  She will be indpendent with all HEp issued    Time  6    Period  Weeks    Status  New      PT LONG TERM GOAL #2   Title  Active cervical ROM will be WNL with 70 degrees of rotation    Time  6    Period  Weeks    Status  New      PT LONG TERM GOAL #3   Title  She will report headaches decreased to 2 per week    Time  6    Period  Weeks    Status  New      PT LONG TERM GOAL #4   Title  She will report Lt shoulder pain de creased 50% or more with use     Time  6    Period  Weeks    Status  New      PT LONG TERM GOAL #5   Title  She will demo full actie LT shoulder ROM with 1-2 max pain    Time  6    Period  Weeks    Status  New            Plan - 02/19/19  [id number]    Clinical Impression Statement  Pt reported soreness with resolution of HA as expected following DN today. Asked her to set a timer for every 45 min today to stand and move her head/neck to avoid tightening up. Good tolerance to exercise but pt is hesitant for fear of pain.     PT Treatment/Interventions  Iontophoresis 4mg /ml Dexamethasone;Moist Heat;Cryotherapy;Ultrasound;Therapeutic exercise;Patient/family education;Manual techniques;Dry needling;Passive range of motion;Taping    PT Next Visit Plan  outcome of DN? continue PRN, periscap strengthening- try prone    PT Home Exercise Plan  scapula and cervical retraction, cervical rotation; open book, door pec stretch, supine scap retraction    Consulted and Agree with Plan of Care  Patient       Patient will benefit from skilled therapeutic intervention in order to improve the following deficits and impairments:  Dizziness, Pain, Postural dysfunction, Increased muscle spasms, Decreased activity tolerance, Decreased range of motion, Impaired UE functional use  Visit Diagnosis: Chronic migraine without aura without status migrainosus, not intractable  Myofascial pain syndrome, cervical  Cervicalgia  Other muscle spasm  Chronic left shoulder pain  Abnormal posture     Problem List Patient Active Problem List   Diagnosis Date Noted  . Chronic migraine without aura without status migrainosus, not intractable 12/11/2018  . Sleep difficulties 12/11/2018  . GERD (gastroesophageal reflux disease) 08/09/2018  . Chronic shoulder pain 08/09/2018  . Anxiety state 12/03/2014  . Frequent headaches 12/03/2014   Jaidalyn Schillo C. Zariel Capano PT, DPT 02/19/19 9:50 AM   [facility] [address] [place], [place], [address] Phone: [phone]   Fax:  [phone]  Name: Lindsay Nicholson MRN: [id number] Date of Birth: 06/07/1986

## 2019-02-21 ENCOUNTER — Ambulatory Visit: Payer: Commercial Managed Care - PPO | Admitting: Physical Therapy

## 2019-02-21 ENCOUNTER — Other Ambulatory Visit: Payer: Self-pay

## 2019-02-21 ENCOUNTER — Encounter: Payer: Self-pay | Admitting: Physical Therapy

## 2019-02-21 DIAGNOSIS — G8929 Other chronic pain: Secondary | ICD-10-CM

## 2019-02-21 DIAGNOSIS — G43709 Chronic migraine without aura, not intractable, without status migrainosus: Secondary | ICD-10-CM | POA: Diagnosis not present

## 2019-02-21 DIAGNOSIS — M7918 Myalgia, other site: Secondary | ICD-10-CM

## 2019-02-21 DIAGNOSIS — M542 Cervicalgia: Secondary | ICD-10-CM

## 2019-02-21 DIAGNOSIS — R293 Abnormal posture: Secondary | ICD-10-CM

## 2019-02-21 DIAGNOSIS — M62838 Other muscle spasm: Secondary | ICD-10-CM

## 2019-02-21 DIAGNOSIS — M25512 Pain in left shoulder: Secondary | ICD-10-CM

## 2019-02-21 NOTE — Therapy (Signed)
[place], [place], [address] Phone: [phone]   Fax:  [phone]  Physical Therapy Treatment  Patient Details  Name: Lindsay Nicholson MRN: [id number] Date of Birth: June 19, 1986 Referring Provider (PT): Sarina Ill, MD    Encounter Date: 02/21/2019  PT End of Session - 02/21/19 0904    Visit Number  3    Number of Visits  12    Date for PT Re-Evaluation  03/23/19    Authorization Type  UHC    PT Start Time  0900    PT Stop Time  0952    PT Time Calculation (min)  52 min    Activity Tolerance  Patient tolerated treatment well    Behavior During Therapy  [facility] for tasks assessed/performed       Past Medical History:  Diagnosis Date  . Anxiety   . Cervicalgia   . SVD (spontaneous vaginal delivery) 09/14/2011    Past Surgical History:  Procedure Laterality Date  . LAPAROSCOPIC APPENDECTOMY N/A 11/16/2015   Procedure: APPENDECTOMY LAPAROSCOPIC;  Surgeon: Alphonsa Overall, MD;  Location: [facility];  Service: General;  Laterality: N/A;  . SHOULDER FUSION SURGERY      There were no vitals filed for this visit.  Subjective Assessment - 02/21/19 0903    Subjective  Some soreness still this morning. Better movement. It;s okay, I feel it here (Lt suboccipital region) and here (Lt temporal region)    Patient Stated Goals  Have fewer headaches    Currently in Pain?  Yes    Pain Score  2     Pain Location  Shoulder    Pain Orientation  Left    Pain Descriptors / Indicators  Headache    Aggravating Factors   unknown    Pain Relieving Factors  massage                       [facility] Adult PT Treatment/Exercise - 02/21/19 0001      Neck Exercises: Machines for Strengthening   UBE (Upper Arm Bike)  retro 3 min      Neck Exercises: Standing   Other Standing Exercises  rows green tband    Other Standing Exercises  extension red tband      Modalities   Modalities  Moist Heat      Moist Heat Therapy   Number Minutes Moist Heat  10 Minutes    Moist Heat Location  Shoulder   Left     Manual Therapy   Manual therapy comments  skilled palpation and monitoring during TPDN    Joint Mobilization  lateral cervical mobilizations; Lt first rib depression gr 4    Soft tissue mobilization  suboccipitals, bil upper traps, Lt scalenes      Neck Exercises: Stretches   Upper Trapezius Stretch  30 seconds;Right;Left    Levator Stretch  Right;Left;30 seconds       Trigger Point Dry Needling - 02/21/19 0001    Upper Trapezius Response  Twitch reponse elicited;Palpable increased muscle length   Lt            PT Short Term Goals - 02/07/19 0914      PT SHORT TERM GOAL #1   Title  She will demo understanding of good posture    Time  2    Period  Weeks    Status  New      PT SHORT TERM GOAL #2   Title  She  will demo independence with  initial HEp     Time  2    Period  Weeks    Status  New      PT SHORT TERM GOAL #3   Title  She will improve cervical rotation to 55 degrees bilaterally    Time  2    Period  Weeks    Status  New        PT Long Term Goals - 02/07/19 0916      PT LONG TERM GOAL #1   Title  She will be indpendent with all HEp issued    Time  6    Period  Weeks    Status  New      PT LONG TERM GOAL #2   Title  Active cervical ROM will be WNL with 70 degrees of rotation    Time  6    Period  Weeks    Status  New      PT LONG TERM GOAL #3   Title  She will report headaches decreased to 2 per week    Time  6    Period  Weeks    Status  New      PT LONG TERM GOAL #4   Title  She will report Lt shoulder pain de creased 50% or more with use     Time  6    Period  Weeks    Status  New      PT LONG TERM GOAL #5   Title  She will demo full actie LT shoulder ROM with 1-2 max pain    Time  6    Period  Weeks    Status  New            Plan - 02/21/19 0946    Clinical Impression Statement  Reported feeling better after treatment. Pt is a stomach  sleeper and we discussed importance of trying to change her positioning. Trigger points in Lt subcoccipitals & upper traps created concordant pain.     PT Treatment/Interventions  Iontophoresis 4mg /ml Dexamethasone;Moist Heat;Cryotherapy;Ultrasound;Therapeutic exercise;Patient/family education;Manual techniques;Dry needling;Passive range of motion;Taping    PT Next Visit Plan  continue manual therapy PRN, avoid sleeping prone? continue periscap strengthening exercises    PT Home Exercise Plan  scapula and cervical retraction, cervical rotation; open book, door pec stretch, supine scap retraction; row, extension pull    Consulted and Agree with Plan of Care  Patient       Patient will benefit from skilled therapeutic intervention in order to improve the following deficits and impairments:  Dizziness, Pain, Postural dysfunction, Increased muscle spasms, Decreased activity tolerance, Decreased range of motion, Impaired UE functional use  Visit Diagnosis: Chronic migraine without aura without status migrainosus, not intractable  Myofascial pain syndrome, cervical  Cervicalgia  Other muscle spasm  Chronic left shoulder pain  Abnormal posture     Problem List Patient Active Problem List   Diagnosis Date Noted  . Chronic migraine without aura without status migrainosus, not intractable 12/11/2018  . Sleep difficulties 12/11/2018  . GERD (gastroesophageal reflux disease) 08/09/2018  . Chronic shoulder pain 08/09/2018  . Anxiety state 12/03/2014  . Frequent headaches 12/03/2014  Willye Javier C. Sola Margolis PT, DPT 02/21/19 9:57 AM   [facility] [address] [place], [place], [address] Phone: [phone]   Fax:  [phone]  Name: Lindsay Nicholson MRN: [id number] Date of Birth: 07/20/1986

## 2019-02-27 ENCOUNTER — Encounter: Payer: Self-pay | Admitting: Physical Therapy

## 2019-02-27 ENCOUNTER — Other Ambulatory Visit: Payer: Self-pay

## 2019-02-27 ENCOUNTER — Ambulatory Visit: Payer: Commercial Managed Care - PPO | Admitting: Physical Therapy

## 2019-02-27 DIAGNOSIS — G8929 Other chronic pain: Secondary | ICD-10-CM

## 2019-02-27 DIAGNOSIS — R293 Abnormal posture: Secondary | ICD-10-CM

## 2019-02-27 DIAGNOSIS — M62838 Other muscle spasm: Secondary | ICD-10-CM

## 2019-02-27 DIAGNOSIS — G43709 Chronic migraine without aura, not intractable, without status migrainosus: Secondary | ICD-10-CM | POA: Diagnosis not present

## 2019-02-27 DIAGNOSIS — M542 Cervicalgia: Secondary | ICD-10-CM

## 2019-02-27 DIAGNOSIS — M7918 Myalgia, other site: Secondary | ICD-10-CM

## 2019-02-27 NOTE — Therapy (Signed)
[place], [place], [address] Phone: [phone]   Fax:  [phone]  Physical Therapy Treatment  Patient Details  Name: Lindsay Nicholson MRN: [id number] Date of Birth: [birth year] Referring Provider (PT): Sarina Ill, MD    Encounter Date: 02/27/2019  PT End of Session - 02/27/19 1601    Visit Number  4    Number of Visits  12    Date for PT Re-Evaluation  03/23/19    PT Start Time  1601    PT Stop Time  1645    PT Time Calculation (min)  44 min    Activity Tolerance  Patient tolerated treatment well    Behavior During Therapy  [facility] for tasks assessed/performed       Past Medical History:  Diagnosis Date  . Anxiety   . Cervicalgia   . SVD (spontaneous vaginal delivery) 09/14/2011    Past Surgical History:  Procedure Laterality Date  . LAPAROSCOPIC APPENDECTOMY N/A 11/16/2015   Procedure: APPENDECTOMY LAPAROSCOPIC;  Surgeon: Alphonsa Overall, MD;  Location: [facility];  Service: General;  Laterality: N/A;  . SHOULDER FUSION SURGERY      There were no vitals filed for this visit.  Subjective Assessment - 02/27/19 1603    Subjective  "I had a bad episode last night, it was the whole L side, espeically and the Lt sub-occipital.    Patient Stated Goals  Have fewer headaches    Currently in Pain?  Yes    Pain Score  7     Pain Orientation  Left    Pain Descriptors / Indicators  Aching    Pain Type  Chronic pain         [facility] PT Assessment - 02/27/19 0001      Assessment   Medical Diagnosis  chronic migraine and myofascial pain syndrome    Referring Provider (PT)  Sarina Ill, MD                    [facility] Adult PT Treatment/Exercise - 02/27/19 1607      Neck Exercises: Seated   Neck Retraction  10 reps;5 secs   MAI to the L/R    Other Seated Exercise  row 2 x 10 with green theraband, scapular retraction with bil ER 2 x 10 with green theraband      Manual Therapy   Manual Therapy  Other  (comment)    Manual therapy comments  skilled palpation and monitoring during TPDN    Joint Mobilization  L unilateral  cervical mobilizations; Lt first rib depression gr 4    Soft tissue mobilization  suboccipitals, bil upper traps, Lt scalenes    Other Manual Therapy  Bil upper trap inhibition taping      Neck Exercises: Stretches   Upper Trapezius Stretch  2 reps;30 seconds    Levator Stretch  2 reps;30 seconds       Trigger Point Dry Needling - 02/27/19 0001    Consent Given?  Yes    Education Handout Provided  Previously provided    Muscles Treated Head and Neck  Upper trapezius;Suboccipitals;Cervical multifidi;Levator scapulae    Other Dry Needling  suboccipitals Left    Upper Trapezius Response  Twitch reponse elicited;Palpable increased muscle length    Levator Scapulae Response  Twitch response elicited;Palpable increased muscle length    Cervical multifidi Response  Twitch reponse elicited;Palpable increased muscle length   C3, C5, C7  PT Short Term Goals - 02/07/19 0914      PT SHORT TERM GOAL #1   Title  She will demo understanding of good posture    Time  2    Period  Weeks    Status  New      PT SHORT TERM GOAL #2   Title  She will demo independence with  initial HEp     Time  2    Period  Weeks    Status  New      PT SHORT TERM GOAL #3   Title  She will improve cervical rotation to 55 degrees bilaterally    Time  2    Period  Weeks    Status  New        PT Long Term Goals - 02/07/19 0916      PT LONG TERM GOAL #1   Title  She will be indpendent with all HEp issued    Time  6    Period  Weeks    Status  New      PT LONG TERM GOAL #2   Title  Active cervical ROM will be WNL with 70 degrees of rotation    Time  6    Period  Weeks    Status  New      PT LONG TERM GOAL #3   Title  She will report headaches decreased to 2 per week    Time  6    Period  Weeks    Status  New      PT LONG TERM GOAL #4   Title  She will report  Lt shoulder pain de creased 50% or more with use     Time  6    Period  Weeks    Status  New      PT LONG TERM GOAL #5   Title  She will demo full actie LT shoulder ROM with 1-2 max pain    Time  6    Period  Weeks    Status  New            Plan - 02/27/19 1648    Clinical Impression Statement  pt noted improvement last session with TPDN. continued TPDN along sub-occipitals, upper trap, levator scapulae and Scalenes on the L which reproduced concordant symptoms. trial inhibition tapin to provide relief and continued scapular stabilization.  end of sessoin she did report feeling like a headache was coming on, but declined modalitites.    PT Treatment/Interventions  Iontophoresis 4mg /ml Dexamethasone;Moist Heat;Cryotherapy;Ultrasound;Therapeutic exercise;Patient/family education;Manual techniques;Dry needling;Passive range of motion;Taping    PT Next Visit Plan  continue manual therapy PRN, avoid sleeping prone? continue periscap strengthening exercises, posture education in sitting, how was taping    PT Home Exercise Plan  scapula and cervical retraction, cervical rotation; open book, door pec stretch, supine scap retraction; row, extension pull    Consulted and Agree with Plan of Care  Patient       Patient will benefit from skilled therapeutic intervention in order to improve the following deficits and impairments:  Dizziness, Pain, Postural dysfunction, Increased muscle spasms, Decreased activity tolerance, Decreased range of motion, Impaired UE functional use  Visit Diagnosis: 1. Chronic migraine without aura without status migrainosus, not intractable   2. Myofascial pain syndrome, cervical   3. Cervicalgia   4. Other muscle spasm   5. Chronic left shoulder pain   6. Abnormal posture  Problem List Patient Active Problem List   Diagnosis Date Noted  . Chronic migraine without aura without status migrainosus, not intractable 12/11/2018  . Sleep difficulties  12/11/2018  . GERD (gastroesophageal reflux disease) 08/09/2018  . Chronic shoulder pain 08/09/2018  . Anxiety state 12/03/2014  . Frequent headaches 12/03/2014   [name] PT, DPT, LAT, ATC  02/27/19  4:51 PM      [facility] [address] [place], [place], [address] Phone: [phone]   Fax:  [phone]  Name: Lindsay Nicholson MRN: [id number] Date of Birth: 09/05/1986

## 2019-03-01 ENCOUNTER — Ambulatory Visit: Payer: Commercial Managed Care - PPO | Admitting: Physical Therapy

## 2019-03-05 ENCOUNTER — Ambulatory Visit: Payer: Commercial Managed Care - PPO | Admitting: Physical Therapy

## 2019-03-05 ENCOUNTER — Other Ambulatory Visit: Payer: Self-pay

## 2019-03-05 ENCOUNTER — Encounter: Payer: Self-pay | Admitting: Physical Therapy

## 2019-03-05 DIAGNOSIS — G43709 Chronic migraine without aura, not intractable, without status migrainosus: Secondary | ICD-10-CM

## 2019-03-05 DIAGNOSIS — M7918 Myalgia, other site: Secondary | ICD-10-CM

## 2019-03-05 DIAGNOSIS — M542 Cervicalgia: Secondary | ICD-10-CM

## 2019-03-05 DIAGNOSIS — M62838 Other muscle spasm: Secondary | ICD-10-CM

## 2019-03-05 NOTE — Therapy (Signed)
[place], [place], [address] Phone: [phone]   Fax:  [phone]  Physical Therapy Treatment  Patient Details  Name: Lindsay Nicholson MRN: [id number] Date of Birth: 08-Sep-1986 Referring Provider (PT): Sarina Ill, MD    Encounter Date: 03/05/2019  PT End of Session - 03/05/19 0947    Visit Number  5    Number of Visits  12    Date for PT Re-Evaluation  03/23/19    Authorization Type  UHC    PT Start Time  0905    PT Stop Time  0946    PT Time Calculation (min)  41 min    Activity Tolerance  Patient tolerated treatment well    Behavior During Therapy  [facility] for tasks assessed/performed       Past Medical History:  Diagnosis Date  . Anxiety   . Cervicalgia   . SVD (spontaneous vaginal delivery) 09/14/2011    Past Surgical History:  Procedure Laterality Date  . LAPAROSCOPIC APPENDECTOMY N/A 11/16/2015   Procedure: APPENDECTOMY LAPAROSCOPIC;  Surgeon: Alphonsa Overall, MD;  Location: [facility];  Service: General;  Laterality: N/A;  . SHOULDER FUSION SURGERY      There were no vitals filed for this visit.  Subjective Assessment - 03/05/19 0908    Subjective  I was sore after last visit. I had a HA the night before, day of and day after needling. It was not as bad after I had the needling and it was just in the Lt suboccipital region of my head. Tape was helpful.    Currently in Pain?  Yes    Pain Score  4     Pain Location  Neck    Pain Orientation  Left    Pain Descriptors / Indicators  Headache    Aggravating Factors   unknown    Pain Relieving Factors  massage                       [facility] Adult PT Treatment/Exercise - 03/05/19 0001      Neck Exercises: Machines for Strengthening   UBE (Upper Arm Bike)  retro 3 min      Neck Exercises: Standing   Other Standing Exercises  shoulder abduction 1 lb palms down      Neck Exercises: Supine   Shoulder ABduction  20 reps    Shoulder  Abduction Limitations  red tband    Upper Extremity Flexion with Stabilization  15 reps    UE Flexion with Stabilization Limitations  red tband      Manual Therapy   Joint Mobilization  C2 post mobs- educated pt on self mobs as well    Soft tissue mobilization  suboccipital release      Neck Exercises: Stretches   Other Neck Stretches  door chest stretch               PT Short Term Goals - 02/07/19 0914      PT SHORT TERM GOAL #1   Title  She will demo understanding of good posture    Time  2    Period  Weeks    Status  New      PT SHORT TERM GOAL #2   Title  She will demo independence with  initial HEp     Time  2    Period  Weeks    Status  New      PT SHORT TERM GOAL #  3   Title  She will improve cervical rotation to 55 degrees bilaterally    Time  2    Period  Weeks    Status  New        PT Long Term Goals - 02/07/19 0916      PT LONG TERM GOAL #1   Title  She will be indpendent with all HEp issued    Time  6    Period  Weeks    Status  New      PT LONG TERM GOAL #2   Title  Active cervical ROM will be WNL with 70 degrees of rotation    Time  6    Period  Weeks    Status  New      PT LONG TERM GOAL #3   Title  She will report headaches decreased to 2 per week    Time  6    Period  Weeks    Status  New      PT LONG TERM GOAL #4   Title  She will report Lt shoulder pain de creased 50% or more with use     Time  6    Period  Weeks    Status  New      PT LONG TERM GOAL #5   Title  She will demo full actie LT shoulder ROM with 1-2 max pain    Time  6    Period  Weeks    Status  New            Plan - 03/05/19 [id number]    Clinical Impression Statement  pt continues with tendency toward slouching, forward-head posture and importance of upright posture was discussed again today. focused on exercises to activate periscap musculature and deltoids. Is going fishing with her daughter today for her birthday.    PT Treatment/Interventions   Iontophoresis 4mg /ml Dexamethasone;Moist Heat;Cryotherapy;Ultrasound;Therapeutic exercise;Patient/family education;Manual techniques;Dry needling;Passive range of motion;Taping    PT Next Visit Plan  core activation, deltoid, DN PRN, review HEP    PT Home Exercise Plan  scapula and cervical retraction, cervical rotation; open book, door pec stretch, supine scap retraction; row, extension pull; abd palms down, supine horiz abd, sidelying ER    Consulted and Agree with Plan of Care  Patient       Patient will benefit from skilled therapeutic intervention in order to improve the following deficits and impairments:  Dizziness, Pain, Postural dysfunction, Increased muscle spasms, Decreased activity tolerance, Decreased range of motion, Impaired UE functional use  Visit Diagnosis: 1. Chronic migraine without aura without status migrainosus, not intractable   2. Myofascial pain syndrome, cervical   3. Cervicalgia   4. Other muscle spasm        Problem List Patient Active Problem List   Diagnosis Date Noted  . Chronic migraine without aura without status migrainosus, not intractable 12/11/2018  . Sleep difficulties 12/11/2018  . GERD (gastroesophageal reflux disease) 08/09/2018  . Chronic shoulder pain 08/09/2018  . Anxiety state 12/03/2014  . Frequent headaches 12/03/2014    Herbert Aguinaldo C. Brendyn Mclaren PT, DPT 03/05/19 9:51 AM   [facility] [address] [place], [place], [address] Phone: [phone]   Fax:  [phone]  Name: Lindsay Nicholson MRN: [id number] Date of Birth: 04/17/1986

## 2019-03-07 ENCOUNTER — Ambulatory Visit: Payer: Commercial Managed Care - PPO | Admitting: Physical Therapy

## 2019-03-12 ENCOUNTER — Ambulatory Visit: Payer: Commercial Managed Care - PPO | Admitting: Physical Therapy

## 2019-03-13 ENCOUNTER — Ambulatory Visit: Payer: Commercial Managed Care - PPO | Admitting: Physical Therapy

## 2019-03-13 ENCOUNTER — Encounter: Payer: Self-pay | Admitting: Physical Therapy

## 2019-03-13 ENCOUNTER — Other Ambulatory Visit: Payer: Self-pay

## 2019-03-13 DIAGNOSIS — M542 Cervicalgia: Secondary | ICD-10-CM

## 2019-03-13 DIAGNOSIS — M25512 Pain in left shoulder: Secondary | ICD-10-CM

## 2019-03-13 DIAGNOSIS — G43709 Chronic migraine without aura, not intractable, without status migrainosus: Secondary | ICD-10-CM | POA: Diagnosis not present

## 2019-03-13 DIAGNOSIS — G8929 Other chronic pain: Secondary | ICD-10-CM

## 2019-03-13 DIAGNOSIS — M62838 Other muscle spasm: Secondary | ICD-10-CM

## 2019-03-13 DIAGNOSIS — R293 Abnormal posture: Secondary | ICD-10-CM

## 2019-03-13 DIAGNOSIS — M7918 Myalgia, other site: Secondary | ICD-10-CM

## 2019-03-13 NOTE — Therapy (Addendum)
[place], [place], [address] Phone: [phone]   Fax:  [phone]  Physical Therapy Treatment/Discharge  Patient Details  Name: Lindsay Nicholson MRN: [id number] Date of Birth: 1986-01-30 Referring Provider (PT): Sarina Ill, MD    Encounter Date: 03/13/2019  PT End of Session - 03/13/19 1607    Visit Number  6    Number of Visits  12    Date for PT Re-Evaluation  03/23/19    Authorization Type  [employer]    PT Start Time  1603    PT Stop Time  1640    PT Time Calculation (min)  37 min    Activity Tolerance  Patient tolerated treatment well    Behavior During Therapy  [facility] for tasks assessed/performed       Past Medical History:  Diagnosis Date  . Anxiety   . Cervicalgia   . SVD (spontaneous vaginal delivery) 09/14/2011    Past Surgical History:  Procedure Laterality Date  . LAPAROSCOPIC APPENDECTOMY N/A 11/16/2015   Procedure: APPENDECTOMY LAPAROSCOPIC;  Surgeon: Alphonsa Overall, MD;  Location: [facility];  Service: General;  Laterality: N/A;  . SHOULDER FUSION SURGERY      There were no vitals filed for this visit.  Subjective Assessment - 03/13/19 1604    Subjective  A little dizzy and nauseous today. Feel light headed. Took medication this morning, this feels pretty consistent with migraine pain. Num in Left shoulder, hurts inferior to scapula on Left. Began on Sunday.    Patient Stated Goals  Have fewer headaches    Currently in Pain?  Yes    Pain Score  6     Pain Location  Head    Pain Orientation  Left;Posterior;Lateral    Pain Descriptors / Indicators  Headache                       [facility] Adult PT Treatment/Exercise - 03/13/19 0001      Moist Heat Therapy   Number Minutes Moist Heat  10 Minutes    Moist Heat Location  Cervical      Manual Therapy   Manual Therapy  Manual Traction    Manual therapy comments  skilled palpation and monitoring during TPDN    Joint Mobilization   lateral cervical mobs, upper cervical AP mobs in supine, left first rib depression    Soft tissue mobilization  Lt upper trap, levator, bil suboccipitals    Manual Traction  cervical       Trigger Point Dry Needling - 03/13/19 0001    Muscles Treated Upper Quadrant  Rhomboids    Upper Trapezius Response  Twitch reponse elicited;Palpable increased muscle length   Left   Levator Scapulae Response  Twitch response elicited;Palpable increased muscle length   Left   Rhomboids Response  Twitch response elicited;Palpable increased muscle length   LEft            PT Short Term Goals - 02/07/19 0914      PT SHORT TERM GOAL #1   Title  She will demo understanding of good posture    Time  2    Period  Weeks    Status  New      PT SHORT TERM GOAL #2   Title  She will demo independence with  initial HEp     Time  2    Period  Weeks    Status  New  PT SHORT TERM GOAL #3   Title  She will improve cervical rotation to 55 degrees bilaterally    Time  2    Period  Weeks    Status  New        PT Long Term Goals - 02/07/19 0916      PT LONG TERM GOAL #1   Title  She will be indpendent with all HEp issued    Time  6    Period  Weeks    Status  New      PT LONG TERM GOAL #2   Title  Active cervical ROM will be WNL with 70 degrees of rotation    Time  6    Period  Weeks    Status  New      PT LONG TERM GOAL #3   Title  She will report headaches decreased to 2 per week    Time  6    Period  Weeks    Status  New      PT LONG TERM GOAL #4   Title  She will report Lt shoulder pain de creased 50% or more with use     Time  6    Period  Weeks    Status  New      PT LONG TERM GOAL #5   Title  She will demo full actie LT shoulder ROM with 1-2 max pain    Time  6    Period  Weeks    Status  New            Plan - 03/13/19 1632    Clinical Impression Statement  Pt presented to PT with complaints of bad migraine that began on Sunday. After extensive manual  therapy, pt verbalized resolution of HA pain. Continues to resort to poor posture and was encouraged, again, to practice better posture to maintain gains from PT.    PT Treatment/Interventions  Iontophoresis 71m/ml Dexamethasone;Moist Heat;Cryotherapy;Ultrasound;Therapeutic exercise;Patient/family education;Manual techniques;Dry needling;Passive range of motion;Taping    PT Next Visit Plan  core activation, deltoid, DN PRN, review HEP    PT Home Exercise Plan  scapula and cervical retraction, cervical rotation; open book, door pec stretch, supine scap retraction; row, extension pull; abd palms down, supine horiz abd, sidelying ER    Consulted and Agree with Plan of Care  Patient       Patient will benefit from skilled therapeutic intervention in order to improve the following deficits and impairments:  Dizziness, Pain, Postural dysfunction, Increased muscle spasms, Decreased activity tolerance, Decreased range of motion, Impaired UE functional use  Visit Diagnosis: 1. Chronic migraine without aura without status migrainosus, not intractable   2. Myofascial pain syndrome, cervical   3. Cervicalgia   4. Other muscle spasm   5. Chronic left shoulder pain   6. Abnormal posture        Problem List Patient Active Problem List   Diagnosis Date Noted  . Chronic migraine without aura without status migrainosus, not intractable 12/11/2018  . Sleep difficulties 12/11/2018  . GERD (gastroesophageal reflux disease) 08/09/2018  . Chronic shoulder pain 08/09/2018  . Anxiety state 12/03/2014  . Frequent headaches 12/03/2014    Delrico Minehart C. Bernardina Cacho PT, DPT 03/13/19 4:36 PM   [facility] [address]Phone: [phone]  Fax:  [phone] Name: Lindsay TelfordMRN: [id number] of Birth: [birth year] PHYSICAL THERAPY DISCHARGE SUMMARY  Visits from Start of  Care: 6  Current functional level related to goals /  functional outcomes: See above   Remaining deficits: See above   Education / Equipment: Anatomy of condition, POC, HEP, exercise form/rationale  Plan: Patient agrees to discharge.  Patient goals were not met. Patient is being discharged due to not returning since the last visit.  ?????    Cancelled appointments due to work schedule.  Caetano Oberhaus C. Kennadie Brenner PT, DPT 05/15/19 5:14 PM

## 2019-03-14 ENCOUNTER — Ambulatory Visit: Payer: Commercial Managed Care - PPO | Admitting: Physical Therapy

## 2019-03-15 ENCOUNTER — Encounter: Payer: Self-pay | Admitting: Psychiatry

## 2019-03-15 ENCOUNTER — Other Ambulatory Visit: Payer: Self-pay

## 2019-03-15 ENCOUNTER — Ambulatory Visit (INDEPENDENT_AMBULATORY_CARE_PROVIDER_SITE_OTHER): Payer: Commercial Managed Care - PPO | Admitting: Psychiatry

## 2019-03-15 DIAGNOSIS — F410 Panic disorder [episodic paroxysmal anxiety] without agoraphobia: Secondary | ICD-10-CM | POA: Insufficient documentation

## 2019-03-15 DIAGNOSIS — F17200 Nicotine dependence, unspecified, uncomplicated: Secondary | ICD-10-CM

## 2019-03-15 DIAGNOSIS — F3181 Bipolar II disorder: Secondary | ICD-10-CM

## 2019-03-15 DIAGNOSIS — F424 Excoriation (skin-picking) disorder: Secondary | ICD-10-CM | POA: Diagnosis not present

## 2019-03-15 MED ORDER — QUETIAPINE FUMARATE 50 MG PO TABS: 50.0000 mg | ORAL_TABLET | Freq: Every day | ORAL | 1 refills | Status: DC

## 2019-03-15 MED ORDER — PROPRANOLOL HCL 10 MG PO TABS: 10.0000 mg | ORAL_TABLET | Freq: Two times a day (BID) | ORAL | 1 refills | Status: DC | PRN
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
## 2019-03-19 ENCOUNTER — Encounter: Payer: Self-pay | Admitting: Neurology

## 2019-03-19 ENCOUNTER — Ambulatory Visit (INDEPENDENT_AMBULATORY_CARE_PROVIDER_SITE_OTHER): Payer: Commercial Managed Care - PPO | Admitting: Neurology

## 2019-03-19 ENCOUNTER — Other Ambulatory Visit: Payer: Self-pay

## 2019-03-19 VITALS — BP 122/76 | HR 96 | Temp 98.0°F | Ht 61.0 in | Wt 122.0 lb

## 2019-03-19 DIAGNOSIS — G43009 Migraine without aura, not intractable, without status migrainosus: Secondary | ICD-10-CM

## 2019-03-19 DIAGNOSIS — M7918 Myalgia, other site: Secondary | ICD-10-CM

## 2019-03-19 MED ORDER — METHOCARBAMOL 500 MG PO TABS: 500.0000 mg | ORAL_TABLET | Freq: Three times a day (TID) | ORAL | 6 refills | Status: DC | PRN

## 2019-03-19 MED ORDER — DICLOFENAC SODIUM 1 % TD GEL: 4.0000 g | Freq: Four times a day (QID) | TRANSDERMAL | 6 refills | Status: DC

## 2019-03-19 MED ORDER — LIDOCAINE 5 % EX PTCH: 1.0000 | MEDICATED_PATCH | CUTANEOUS | 0 refills | Status: DC

## 2019-03-19 NOTE — Progress Notes (Signed)
[id number]    Provider:  Dr Jaynee Eagles Requesting Provider: Jearld Fenton, NP Primary Care Provider:  Jearld Fenton, NP  CC:  migraines  Interval history March 19, 2019: She is here for follow up for migraines. She is on effexor and feeling better. She is on Effexor 150 and the topamax. Psychiatry has prescribed propranolol for anxiety which is also a good for migraines. She has 4-5 days of migraines a month which is improved. Maxalt helps acutely. Zanaflex, flexeril and baclofen did not help. The neck pain is a separate issue and happens more frequently than the migraines. Starts in the left shoulder area, and radiates up the back of the head. Sharp pain more in the left cervical area and to the neck and the occipital area. Lasts for hours. She has stiffness and shooting pain. She went to dry needling and physical therapy which helped. She tries stretching exercises at home. She stretches in the doorway and uses the band. Can be severe, feels her arm is weak. Pin was 10/10. Tried PT, geat, stretching, under the care of a doctor for 2 years, worseninf Lindsay Silversmith NP.   Personally reviewed XR images 02/06/2019: IMPRESSION: No fracture or spondylolisthesis. No appreciable arthropathy. Reversal of lordotic curvature is likely indicative of a degree of muscle spasm.   Meds tried: effexor, topamac, propranolol. mobic  HPI:  Lindsay Nicholson is a 33 y.o. female here as requested by Jearld Fenton, NP for headaches. Started 8 years ago. Starts in the left back of the head radiates to the shoulder.Also be more left sided (parietal). Shooting sharp pain. It can be severe, she has to stay home, she can't move, pounding and pulsating, nausea and vomited once, light and sound sensitivity. It has lasted 2 weeks but usually 24-72 hours. She has 2 cups of coffee. She has 15 migraine days a month for 6-7 years. They can start in the morning, can be positional, Topamax helps and it was increased  to 100mg . Tried Amitriptyline. She has a lot of neck pain, she says it is pretty bad. She was given meloxicam. She is not sleeping well at night. Her mind races at night. Anxiety is an issue. She sits all day. She stares at a computer all day. She has not had eyes checked. She has face numbness. She has dizziness. No aura.  She has had episodes of spots in her vision, vision loss. No other focal neurologic deficits, associated symptoms, inciting events or modifiable factors.  Review of Systems: Patient complains of symptoms per HPI as well as the following symptoms: headaches, anxiety, insomnia, stress, depression. Pertinent negatives and positives per HPI. All others negative.   Social History   Socioeconomic History   Marital status: Single    Spouse name: Not on file   Number of children: 3   Years of education: Not on file   Highest education level: Associate degree: occupational, Hotel manager, or vocational program  Occupational History   Not on file  Social Needs   Financial resource strain: Not on file   Food insecurity    Worry: Not on file    Inability: Not on file   Transportation needs    Medical: Not on file    Non-medical: Not on file  Tobacco Use   Smoking status: Current Every Day Smoker    Packs/day: 0.25    Types: Cigarettes   Smokeless tobacco: Never Used  Substance and Sexual Activity   Alcohol use: Yes  Alcohol/week: 0.0 standard drinks    Comment: occasional   Drug use: No   Sexual activity: Yes    Birth control/protection: Injection  Lifestyle   Physical activity    Days per week: Not on file    Minutes per session: Not on file   Stress: Not on file  Relationships   Social connections    Talks on phone: Not on file    Gets together: Not on file    Attends religious service: Not on file    Active member of club or organization: Not on file    Attends meetings of clubs or organizations: Not on file    Relationship status: Not on file     Intimate partner violence    Fear of current or ex partner: Not on file    Emotionally abused: Not on file    Physically abused: Not on file    Forced sexual activity: Not on file  Other Topics Concern   Not on file  Social History Narrative   Lives at home with her fiance and her children   Left handed   Caffeine: all day long    Family History  Problem Relation Age of Onset   Diabetes Father    Hypertension Father    High Cholesterol Father    Anesthesia problems Neg Hx    Cancer Neg Hx    Heart disease Neg Hx    Stroke Neg Hx    Migraines Neg Hx     Past Medical History:  Diagnosis Date   Anxiety    Cervicalgia    SVD (spontaneous vaginal delivery) 09/14/2011    Patient Active Problem List   Diagnosis Date Noted   Myofascial pain syndrome, cervical 03/19/2019   Bipolar 2 disorder (HCC) 03/15/2019   Panic attacks 03/15/2019   Skin-picking disorder 03/15/2019   Tobacco use disorder 03/15/2019   Migraine without aura and without status migrainosus, not intractable 12/11/2018   Sleep difficulties 12/11/2018   GERD (gastroesophageal reflux disease) 08/09/2018   Chronic shoulder pain 08/09/2018   Anxiety state 12/03/2014   Frequent headaches 12/03/2014    Past Surgical History:  Procedure Laterality Date   LAPAROSCOPIC APPENDECTOMY N/A 11/16/2015   Procedure: APPENDECTOMY LAPAROSCOPIC;  Surgeon: Ovidio Kin, MD;  Location: [facility];  Service: General;  Laterality: N/A;   SHOULDER FUSION SURGERY      Current Outpatient Medications  Medication Sig Dispense Refill   Aspirin-Acetaminophen-Caffeine (EXCEDRIN MIGRAINE PO) Take by mouth as needed.     baclofen (LIORESAL) 10 MG tablet Take 1 tablet (10 mg total) by mouth 2 (two) times daily. 60 each 0   medroxyPROGESTERone Acetate 150 MG/ML SUSY      meloxicam (MOBIC) 15 MG tablet Take 1 tablet by mouth once daily 90 tablet 0   ondansetron (ZOFRAN-ODT) 4 MG disintegrating tablet Take 1  tablet (4 mg total) by mouth every 8 (eight) hours as needed for nausea. Or for dizziness or headache and migraine. 30 tablet 3   rizatriptan (MAXALT-MLT) 10 MG disintegrating tablet Take 1 tablet (10 mg total) by mouth as needed for migraine. May repeat in 2 hours if needed 9 tablet 11   topiramate (TOPAMAX) 100 MG tablet Take 1 tablet (100 mg total) by mouth daily. 30 tablet 5   venlafaxine XR (EFFEXOR-XR) 150 MG 24 hr capsule Take 1 capsule (150 mg total) by mouth daily with breakfast. 90 capsule 11   diclofenac sodium (VOLTAREN) 1 % GEL Apply 4 g topically 4 (four)  times daily. 50 g 6   lidocaine (LIDODERM) 5 % Place 1 patch onto the skin daily. Remove & Discard patch within 12 hours or as directed by MD 30 patch 0   methocarbamol (ROBAXIN) 500 MG tablet Take 1 tablet (500 mg total) by mouth every 8 (eight) hours as needed for muscle spasms. 90 tablet 6   propranolol (INDERAL) 10 MG tablet Take 1 tablet (10 mg total) by mouth 2 (two) times daily as needed. For severe anxiety attacks only (Patient not taking: Reported on 03/19/2019) 60 tablet 1   QUEtiapine (SEROQUEL) 50 MG tablet Take 1 tablet (50 mg total) by mouth at bedtime. START TAKING 25 MG FOR 2 WEEKS AND INCREASE TO 50 MG AFTER THAT. (Patient not taking: Reported on 03/19/2019) 30 tablet 1   No current facility-administered medications for this visit.     Allergies as of 03/19/2019   (No Known Allergies)    Vitals: BP 122/76 (BP Location: Right Arm, Patient Position: Sitting)    Pulse 96    Temp 98 F (36.7 C) Comment: taken by front staff   Ht 5\' 1"  (1.549 m)    Wt 122 lb (55.3 kg)    BMI 23.05 kg/m  Last Weight:  Wt Readings from Last 1 Encounters:  03/19/19 122 lb (55.3 kg)   Last Height:   Ht Readings from Last 1 Encounters:  03/19/19 5\' 1"  (1.549 m)   Physical exam: Exam: Gen: NAD, conversant, well nourised, obese, well groomed                     CV: RRR, no MRG. No Carotid Bruits. No peripheral edema, warm,  nontender Eyes: Conjunctivae clear without exudates or hemorrhage MSK: elevated left shoulder, decreased ROm to the left, hypertrophied trapezius left  Neuro: Detailed Neurologic Exam  Speech:    Speech is normal; fluent and spontaneous with normal comprehension.  Cognition:    The patient is oriented to person, place, and time;     recent and remote memory intact;     language fluent;     normal attention, concentration,     fund of knowledge Cranial Nerves:    The pupils are equal, round, and reactive to light. The fundi are normal and spontaneous venous pulsations are present. Visual fields are full to finger confrontation. Extraocular movements are intact. Trigeminal sensation is intact and the muscles of mastication are normal. The face is symmetric. The palate elevates in the midline. Hearing intact. Voice is normal. Shoulder shrug is normal. The tongue has normal motion without fasciculations.   Coordination:    Normal finger to nose and heel to shin. Normal rapid alternating movements.   Gait:    Heel-toe and tandem gait are normal.   Motor Observation:    No asymmetry, no atrophy, and no involuntary movements noted. Tone:    Normal muscle tone.    Posture:    Posture is normal. normal erect    Strength:    Strength is V/V in the upper and lower limbs.      Sensation: intact to LT     Reflex Exam:  DTR's:    Deep tendon reflexes in the upper and lower extremities are normal bilaterally.   Toes:    The toes are downgoing bilaterally.   Clonus:    Clonus is absent.    Assessment/Plan:  Lindsay Nicholson is a 33 y.o. female here as requested by Lorre MunroeBaity, Regina W, NP for migraines. Also cervical/musculoskeletal neck  pain. Migraines improved. Neck pain and muscle pain worsening.   - MRI cervical spine discussed, xrays looked good with only loss of cervical lordosis will hold off at this time as likely this is musculoskeletal and not cervical degeneration but can consider  MRI Cervical Spine in furture if needed - Dr. Wynn BankerKirsteins for evaluation of botox therapy for cervical dystonia, on exam she has elevated left shoulder, decreased Rom to the left, hypertrophied trapezius left and has failed multiple meds and modalities can have her evaluated (failed tizanidine, baclofen, flexeril, meloxicam, OTC analgesics and anti-inflammatories, dry needling and PT, continue to c/o of worsening left cervical muscle pain) - recommend heating and lidodarm patches, voltaren gel, continue PT exercises at home - Robaxin for muscle spasms - Migraine prevention: Continue Effexor. May also help with depression/anxiety. She is also on Topamax and propranolol from other physicians, warned these can worsen depression but are excellent migraine prevention medications. Migraines improved, continue.  - Acute Migraines: Rizatriptan and zofran. May also help with dizziness/vestibular symptoms with or without head pain - MRI brain unremarkable  Orders Placed This Encounter  Procedures   Ambulatory referral to Physical Medicine Rehab    Meds ordered this encounter  Medications   methocarbamol (ROBAXIN) 500 MG tablet    Sig: Take 1 tablet (500 mg total) by mouth every 8 (eight) hours as needed for muscle spasms.    Dispense:  90 tablet    Refill:  6   lidocaine (LIDODERM) 5 %    Sig: Place 1 patch onto the skin daily. Remove & Discard patch within 12 hours or as directed by MD    Dispense:  30 patch    Refill:  0   diclofenac sodium (VOLTAREN) 1 % GEL    Sig: Apply 4 g topically 4 (four) times daily.    Dispense:  50 g    Refill:  6    Discussed: To prevent or relieve headaches, try the following: Cool Compress. Lie down and place a cool compress on your head.  Avoid headache triggers. If certain foods or odors seem to have triggered your migraines in the past, avoid them. A headache diary might help you identify triggers.  Include physical activity in your daily routine. Try a  daily walk or other moderate aerobic exercise.  Manage stress. Find healthy ways to cope with the stressors, such as delegating tasks on your to-do list.  Practice relaxation techniques. Try deep breathing, yoga, massage and visualization.  Eat regularly. Eating regularly scheduled meals and maintaining a healthy diet might help prevent headaches. Also, drink plenty of fluids.  Follow a regular sleep schedule. Sleep deprivation might contribute to headaches Consider biofeedback. With this mind-body technique, you learn to control certain bodily functions -- such as muscle tension, heart rate and blood pressure -- to prevent headaches or reduce headache pain.    Proceed to emergency room if you experience new or worsening symptoms or symptoms do not resolve, if you have new neurologic symptoms or if headache is severe, or for any concerning symptom.   Provided education and documentation from American headache Society toolbox including articles on: chronic migraine medication overuse headache, chronic migraines, prevention of migraines, behavioral and other nonpharmacologic treatments for headache.  Cc: Lorre MunroeBaity, Regina W, NP  A total of 25 minutes was spent face-to-face with this patient. Over half this time was spent on counseling patient on the  1. Myofascial pain syndrome, cervical   2. Migraine without aura and without status migrainosus, not  intractable    diagnosis and different diagnostic and therapeutic options, counseling and coordination of care, risks ans benefits of management, compliance, or risk factor reduction and education.    Naomie DeanAntonia Rosangelica Pevehouse, MD  [facility] [address] [place], [place] [id number]  Phone [phone] Fax [phone]

## 2019-03-19 NOTE — Patient Instructions (Signed)
Referral to Dr. Wynn BankerKirsteins for botox injections cervical dystonia Methocarbamol, lidocaine patches and voltaren gel as needed Heat, massae, stretching  Methocarbamol tablets What is this medicine? METHOCARBAMOL (meth oh KAR ba mole) helps to relieve pain and stiffness in muscles caused by strains, sprains, or other injury to your muscles. This medicine may be used for other purposes; ask your health care provider or pharmacist if you have questions. COMMON BRAND NAME(S): Robaxin What should I tell my health care provider before I take this medicine? They need to know if you have any of these conditions:  kidney disease  seizures  an unusual or allergic reaction to methocarbamol, other medicines, foods, dyes, or preservatives  pregnant or trying to get pregnant  breast-feeding How should I use this medicine? Take this medicine by mouth with a full glass of water. Follow the directions on the prescription label. Take your medicine at regular intervals. Do not take your medicine more often than directed. Talk to your pediatrician regarding the use of this medicine in children. Special care may be needed. Overdosage: If you think you have taken too much of this medicine contact a poison control center or emergency room at once. NOTE: This medicine is only for you. Do not share this medicine with others. What if I miss a dose? If you miss a dose, take it as soon as you can. If it is almost time for your next dose, take only the next dose. Do not take double or extra doses. What may interact with this medicine? Do not take this medication with any of the following medicines:  narcotic medicines for cough This medicine may also interact with the following medications:  alcohol  antihistamines for allergy, cough and cold  certain medicines for anxiety or sleep  certain medicines for depression like amitriptyline, fluoxetine, sertraline  certain medicines for seizures like  phenobarbital, primidone  cholinesterase inhibitors like neostigmine, ambenonium, and pyridostigmine bromide  general anesthetics like halothane, isoflurane, methoxyflurane, propofol  local anesthetics like lidocaine, pramoxine, tetracaine  medicines that relax muscles for surgery  narcotic medicines for pain  phenothiazines like chlorpromazine, mesoridazine, prochlorperazine, thioridazine This list may not describe all possible interactions. Give your health care provider a list of all the medicines, herbs, non-prescription drugs, or dietary supplements you use. Also tell them if you smoke, drink alcohol, or use illegal drugs. Some items may interact with your medicine. What should I watch for while using this medicine? Tell your doctor or health care professional if your symptoms do not start to get better or if they get worse. You may get drowsy or dizzy. Do not drive, use machinery, or do anything that needs mental alertness until you know how this medicine affects you. Do not stand or sit up quickly, especially if you are an older patient. This reduces the risk of dizzy or fainting spells. Alcohol may interfere with the effect of this medicine. Avoid alcoholic drinks. If you are taking another medicine that also causes drowsiness, you may have more side effects. Give your health care provider a list of all medicines you use. Your doctor will tell you how much medicine to take. Do not take more medicine than directed. Call emergency for help if you have problems breathing or unusual sleepiness. What side effects may I notice from receiving this medicine? Side effects that you should report to your doctor or health care professional as soon as possible:  allergic reactions like skin rash, itching or hives, swelling of the face, lips,  or tongue  breathing problems  confusion  seizures  unusually weak or tired Side effects that usually do not require medical attention (report to your  doctor or health care professional if they continue or are bothersome):  dizziness  headache  metallic taste  tiredness  upset stomach This list may not describe all possible side effects. Call your doctor for medical advice about side effects. You may report side effects to FDA at 1-800-FDA-1088. Where should I keep my medicine? Keep out of the reach of children. Store at room temperature between 20 and 25 degrees C (68 and 77 degrees F). Keep container tightly closed. Throw away any unused medicine after the expiration date. NOTE: This sheet is a summary. It may not cover all possible information. If you have questions about this medicine, talk to your doctor, pharmacist, or health care provider.  2020 Elsevier/Gold Standard (2015-06-10 13:11:54)  Diclofenac skin gel What is this medicine? DICLOFENAC (dye KLOE fen ak) is a non-steroidal anti-inflammatory drug (NSAID). The 1% skin gel is used to treat osteoarthritis of the hands or knees. The 3% skin gel is used to treat actinic keratosis. This medicine may be used for other purposes; ask your health care provider or pharmacist if you have questions. COMMON BRAND NAME(S): DSG Sherrye Payor, Solaravix, Solaraze, Voltaren Gel What should I tell my health care provider before I take this medicine? They need to know if you have any of these conditions:  asthma  bleeding problems  coronary artery bypass graft (CABG) surgery within the past 2 weeks  heart disease  high blood pressure  if you frequently drink alcohol containing drinks  kidney disease  liver disease  open or infected skin  stomach problems  an unusual or allergic reaction to diclofenac, aspirin, other NSAIDs, other medicines, benzyl alcohol (3% gel only), foods, dyes, or preservatives  pregnant or trying to get pregnant  breast-feeding How should I use this medicine? This medicine is for external use only. Follow the directions on the prescription label.  Wash hands before and after use. Do not get this medicine in your eyes. If you do, rinse out with plenty of cool tap water. Use your doses at regular intervals. Do not use your medicine more often than directed. A special MedGuide will be given to you by the pharmacist with each prescription and refill of the 1% gel. Be sure to read this information carefully each time. Talk to your pediatrician regarding the use of this medicine in children. Special care may be needed. The 3% gel is not approved for use in children. Overdosage: If you think you have taken too much of this medicine contact a poison control center or emergency room at once. NOTE: This medicine is only for you. Do not share this medicine with others. What if I miss a dose? If you miss a dose, use it as soon as you can. If it is almost time for your next dose, use only that dose. Do not use double or extra doses. What may interact with this medicine?  aspirin  NSAIDs, medicines for pain and inflammation, like ibuprofen or naproxen Do not use any other skin products without telling your doctor or health care professional. This list may not describe all possible interactions. Give your health care provider a list of all the medicines, herbs, non-prescription drugs, or dietary supplements you use. Also tell them if you smoke, drink alcohol, or use illegal drugs. Some items may interact with your medicine. What should I  watch for while using this medicine? Tell your doctor or healthcare provider if your symptoms do not start to get better or if they get worse. You will need to follow up with your healthcare provider to monitor your progress. You may need to be treated for up to 3 months if you are using the 3% gel, but the full effect may not occur until 1 month after stopping treatment. If you develop a severe skin reaction, contact your doctor or healthcare provider immediately. This medicine may cause serious skin reactions. They can  happen weeks to months after starting the medicine. Contact your healthcare provider right away if you notice fevers or flu-like symptoms with a rash. The rash may be red or purple and then turn into blisters or peeling of the skin. Or, you might notice a red rash with swelling of the face, lips or lymph nodes in your neck or under your arms. This medicine can make you more sensitive to the sun. Keep out of the sun. If you cannot avoid being in the sun, wear protective clothing and use sunscreen. Do not use sun lamps or tanning beds/booths. Do not take medicines such as ibuprofen and naproxen with this medicine. Side effects such as stomach upset, nausea, or ulcers may be more likely to occur. Many medicines available without a prescription should not be taken with this medicine. This medicine does not prevent heart attack or stroke. In fact, this medicine may increase the chance of a heart attack or stroke. The chance may increase with longer use of this medicine and in people who have heart disease. If you take aspirin to prevent heart attack or stroke, talk with your doctor or healthcare provider. This medicine can cause ulcers and bleeding in the stomach and intestines at any time during treatment. Do not smoke cigarettes or drink alcohol. These increase irritation to your stomach and can make it more susceptible to damage from this medicine. Ulcers and bleeding can happen without warning symptoms and can cause death. You may get drowsy or dizzy. Do not drive, use machinery, or do anything that needs mental alertness until you know how this medicine affects you. Do not stand or sit up quickly, especially if you are an older patient. This reduces the risk of dizzy or fainting spells. This medicine can cause you to bleed more easily. Try to avoid damage to your teeth and gums when you brush or floss your teeth. What side effects may I notice from receiving this medicine? Side effects that you should report  to your doctor or health care professional as soon as possible:  allergic reactions like skin rash, itching or hives, swelling of the face, lips, or tongue  black or bloody stools, blood in the urine or vomit  blurred vision  chest pain  difficulty breathing or wheezing  nausea or vomiting  rash, fever, and swollen lymph nodes  redness, blistering, peeling or loosening of the skin, including inside the mouth  slurred speech or weakness on one side of the body  trouble passing urine or change in the amount of urine  unexplained weight gain or swelling  unusually weak or tired  yellowing of eyes or skin Side effects that usually do not require medical attention (report to your doctor or health care professional if they continue or are bothersome):  dizziness  dry skin  headache  heartburn  increased sensitivity to the sun  stomach pain  tingling at the application site This list may not  describe all possible side effects. Call your doctor for medical advice about side effects. You may report side effects to FDA at 1-800-FDA-1088. Where should I keep my medicine? Keep out of the reach of children. Store the 1% gel at room temperature between 15 and 30 degrees C (59 and 86 degrees F). Store the 3% gel at room temperature between 20 and 25 degrees C (68 and 77 degrees F). Protect from light. Throw away any unused medicine after the expiration date. NOTE: This sheet is a summary. It may not cover all possible information. If you have questions about this medicine, talk to your doctor, pharmacist, or health care provider.  2020 Elsevier/Gold Standard (2018-11-15 13:05:18)   Lidocaine dermal patch What is this medicine? LIDOCAINE (LYE doe kane) causes loss of feeling in the skin and surrounding area. The medicine helps treat pain, including nerve pain. This medicine may be used for other purposes; ask your health care provider or pharmacist if you have questions. COMMON  BRAND NAME(S): Aspercreme with Lidocaine, GEN7T, Lidocare, Lidoderm, ZTlido What should I tell my health care provider before I take this medicine? They need to know if you have any of these conditions:  heart disease  history of irregular heart beat  liver disease  skin conditions or sensitivity  skin infection  an unusual or allergic reaction to lidocaine, parabens, other medicines, foods, dyes, or preservatives  pregnant or trying to get pregnant  breast-feeding How should I use this medicine? This medicine is for external use only. Follow the directions on the prescription label or package. Talk to your pediatrician regarding the use of this medicine in children. Special care may be needed. Overdosage: If you think you have taken too much of this medicine contact a poison control center or emergency room at once. NOTE: This medicine is only for you. Do not share this medicine with others. What if I miss a dose? If you miss a dose, take it as soon as you can. If it is almost time for your next dose, take only that dose. Do not take double or extra doses. What may interact with this medicine? Do not take this medicine with any of the following medications:  certain medicines for irregular heart beat  MAOIs like Carbex, Eldepryl, Marplan, Nardil, and Parnate This medicine may also interact with the following medications:  other local anesthetics like pramoxine, tetracaine Do not use any other skin products on the affected area without asking your doctor or health care professional. This list may not describe all possible interactions. Give your health care provider a list of all the medicines, herbs, non-prescription drugs, or dietary supplements you use. Also tell them if you smoke, drink alcohol, or use illegal drugs. Some items may interact with your medicine. What should I watch for while using this medicine? Tell your doctor or healthcare professional if your symptoms do  not start to get better or if they get worse. Be careful to avoid injury while the area is numb from the medicine, and you are not aware of pain. If you are going to need surgery, a MRI, CT scan, or other procedure, tell your doctor that you are using this medicine. You may need to remove this patch before the procedure. Do not get this medicine in your eyes. If you do, rinse out with plenty of cool tap water. This medicine can make certain skin conditions worse. Only use it for conditions for which your doctor or health care professional has prescribed.  What side effects may I notice from receiving this medicine? Side effects that you should report to your doctor or health care professional as soon as possible:  allergic reactions like skin rash, itching or hives, swelling of the face, lips, or tongue  breathing problems  chest pain or chest tightness  dizzines Side effects that usually do not require medical attention (report to your doctor or health care professional if they continue or are bothersome):  tingling, numbness at site where applied This list may not describe all possible side effects. Call your doctor for medical advice about side effects. You may report side effects to FDA at 1-800-FDA-1088. Where should I keep my medicine? Keep out of the reach of children. See product for storage instructions. Each product may have different instructions. NOTE: This sheet is a summary. It may not cover all possible information. If you have questions about this medicine, talk to your doctor, pharmacist, or health care provider.  2020 [employer]vier/Gold Standard (2017-06-01 22:50:48)

## 2019-03-20 ENCOUNTER — Ambulatory Visit: Payer: Commercial Managed Care - PPO | Admitting: Physical Therapy

## 2019-03-22 ENCOUNTER — Ambulatory Visit: Payer: Commercial Managed Care - PPO | Admitting: Physical Therapy

## 2019-03-23 ENCOUNTER — Encounter

## 2019-05-01 ENCOUNTER — Ambulatory Visit (INDEPENDENT_AMBULATORY_CARE_PROVIDER_SITE_OTHER): Payer: Commercial Managed Care - PPO | Admitting: Psychiatry

## 2019-05-01 ENCOUNTER — Other Ambulatory Visit: Payer: Self-pay

## 2019-05-01 ENCOUNTER — Encounter: Payer: Self-pay | Admitting: Psychiatry

## 2019-05-01 DIAGNOSIS — F3181 Bipolar II disorder: Secondary | ICD-10-CM

## 2019-05-01 DIAGNOSIS — F424 Excoriation (skin-picking) disorder: Secondary | ICD-10-CM | POA: Diagnosis not present

## 2019-05-01 DIAGNOSIS — F410 Panic disorder [episodic paroxysmal anxiety] without agoraphobia: Secondary | ICD-10-CM | POA: Diagnosis not present

## 2019-05-01 MED ORDER — QUETIAPINE FUMARATE 100 MG PO TABS: 150.0000 mg | ORAL_TABLET | Freq: Every day | ORAL | 1 refills | Status: DC

## 2019-05-01 NOTE — Progress Notes (Signed)
Virtual Visit via Video Note  I connected with Lindsay Nicholson on 05/01/19 at 10:30 AM EDT by a video enabled telemedicine application and verified that I am speaking with the correct person using two identifiers.   I discussed the limitations of evaluation and management by telemedicine and the availability of in person appointments. The patient expressed understanding and agreed to proceed.   I discussed the assessment and treatment plan with the patient. The patient was provided an opportunity to ask questions and all were answered. The patient agreed with the plan and demonstrated an understanding of the instructions.   The patient was advised to call back or seek an in-person evaluation if the symptoms worsen or if the condition fails to improve as anticipated.   BH MD/PA/NP OP Progress Note  05/01/2019 6:26 PM Laretta Alstromhally Churchman  MRN:  [id number]  Chief Complaint:  Chief Complaint    Follow-up     HPI: Lindsay Nicholson is a 33 year old Hispanic female, [place] in [place] with her boyfriend, 2 children, has a history of bipolar 2 disorder, panic attacks, skin picking disorder, headaches, was evaluated by telemedicine today.  Patient presented 15 minutes late for her appointment.  She reported that she had completely forgotten about the appointment.  Patient today reports she has noticed some progress in her mood and sleep with the Seroquel.  She is also on venlafaxine which she basically takes for her headaches.  She continues to be compliant on the same.  Patient however continues to struggle with sleep.  She is interested in dosage increase today.  She denies any side effects to the Seroquel.  Patient denies any suicidality, homicidality or perceptual disturbances.  Patient reports she continues to have good support system from her boyfriend.  She reports she did not get her therapy sessions scheduled yet.  Will refer her again.  Patient denies any other concerns today. Visit Diagnosis:   ICD-10-CM   1. Bipolar 2 disorder (HCC)  F31.81 QUEtiapine (SEROQUEL) 100 MG tablet  2. Panic attacks  F41.0 QUEtiapine (SEROQUEL) 100 MG tablet  3. Skin-picking disorder  F42.4     Past Psychiatric History: I have reviewed past psychiatric history from my progress note on 03/15/2019.  Past trials of Wellbutrin, BuSpar, hydroxyzine, Effexor  Past Medical History:  Past Medical History:  Diagnosis Date  . Anxiety   . Cervicalgia   . SVD (spontaneous vaginal delivery) 09/14/2011    Past Surgical History:  Procedure Laterality Date  . LAPAROSCOPIC APPENDECTOMY N/A 11/16/2015   Procedure: APPENDECTOMY LAPAROSCOPIC;  Surgeon: Ovidio Kinavid Newman, MD;  Location: [facility];  Service: General;  Laterality: N/A;  . SHOULDER FUSION SURGERY      Family Psychiatric History: I have reviewed family psychiatric history from my progress note on 03/15/2019.  Family History:  Family History  Problem Relation Age of Onset  . Diabetes Father   . Hypertension Father   . High Cholesterol Father   . Anesthesia problems Neg Hx   . Cancer Neg Hx   . Heart disease Neg Hx   . Stroke Neg Hx   . Migraines Neg Hx     Social History: I have reviewed social history from my progress note on 03/15/2019. Social History   Socioeconomic History  . Marital status: Single    Spouse name: Not on file  . Number of children: 3  . Years of education: Not on file  . Highest education level: Associate degree: occupational, Scientist, product/process developmenttechnical, or vocational program  Occupational History  . Not on  file  Social Needs  . Financial resource strain: Not on file  . Food insecurity    Worry: Not on file    Inability: Not on file  . Transportation needs    Medical: Not on file    Non-medical: Not on file  Tobacco Use  . Smoking status: Current Every Day Smoker    Packs/day: 0.25    Types: Cigarettes  . Smokeless tobacco: Never Used  Substance and Sexual Activity  . Alcohol use: Yes    Alcohol/week: 0.0 standard drinks    Comment:  occasional  . Drug use: No  . Sexual activity: Yes    Birth control/protection: Injection  Lifestyle  . Physical activity    Days per week: Not on file    Minutes per session: Not on file  . Stress: Not on file  Relationships  . Social Musicianconnections    Talks on phone: Not on file    Gets together: Not on file    Attends religious service: Not on file    Active member of club or organization: Not on file    Attends meetings of clubs or organizations: Not on file    Relationship status: Not on file  Other Topics Concern  . Not on file  Social History Narrative   Lives at home with her fiance and her children   Left handed   Caffeine: all day long    Allergies: No Known Allergies  Metabolic Disorder Labs: No results found for: HGBA1C, MPG No results found for: PROLACTIN Lab Results  Component Value Date   CHOL 166 08/09/2018   TRIG 254.0 (H) 08/09/2018   HDL 31.80 (L) 08/09/2018   CHOLHDL 5 08/09/2018   VLDL 50.8 (H) 08/09/2018   LDLCALC 97 08/08/2017   LDLCALC 77 08/03/2016   Lab Results  Component Value Date   TSH 0.93 02/06/2019   TSH 1.02 12/03/2014    Therapeutic Level Labs: No results found for: LITHIUM No results found for: VALPROATE No components found for:  CBMZ  Current Medications: Current Outpatient Medications  Medication Sig Dispense Refill  . Aspirin-Acetaminophen-Caffeine (EXCEDRIN MIGRAINE PO) Take by mouth as needed.    . baclofen (LIORESAL) 10 MG tablet Take 1 tablet (10 mg total) by mouth 2 (two) times daily. 60 each 0  . diclofenac sodium (VOLTAREN) 1 % GEL Apply 4 g topically 4 (four) times daily. 50 g 6  . lidocaine (LIDODERM) 5 % Place 1 patch onto the skin daily. Remove & Discard patch within 12 hours or as directed by MD 30 patch 0  . medroxyPROGESTERone Acetate 150 MG/ML SUSY     . meloxicam (MOBIC) 15 MG tablet Take 1 tablet by mouth once daily 90 tablet 0  . methocarbamol (ROBAXIN) 500 MG tablet Take 1 tablet (500 mg total) by mouth  every 8 (eight) hours as needed for muscle spasms. 90 tablet 6  . ondansetron (ZOFRAN-ODT) 4 MG disintegrating tablet Take 1 tablet (4 mg total) by mouth every 8 (eight) hours as needed for nausea. Or for dizziness or headache and migraine. 30 tablet 3  . propranolol (INDERAL) 10 MG tablet Take 1 tablet (10 mg total) by mouth 2 (two) times daily as needed. For severe anxiety attacks only (Patient not taking: Reported on 03/19/2019) 60 tablet 1  . QUEtiapine (SEROQUEL) 100 MG tablet Take 1.5 tablets (150 mg total) by mouth at bedtime. START TAKING 100 MG FOR 3 WEEKS AND INCREASE 150 MG AFTER THAT 45 tablet 1  .  rizatriptan (MAXALT-MLT) 10 MG disintegrating tablet Take 1 tablet (10 mg total) by mouth as needed for migraine. May repeat in 2 hours if needed 9 tablet 11  . topiramate (TOPAMAX) 100 MG tablet Take 1 tablet (100 mg total) by mouth daily. 30 tablet 5  . venlafaxine XR (EFFEXOR-XR) 150 MG 24 hr capsule Take 1 capsule (150 mg total) by mouth daily with breakfast. 90 capsule 11   No current facility-administered medications for this visit.      Musculoskeletal: Strength & Muscle Tone: UTA Gait & Station: normal Patient leans: N/A  Psychiatric Specialty Exam: Review of Systems  Psychiatric/Behavioral: The patient is nervous/anxious and has insomnia.   All other systems reviewed and are negative.   There were no vitals taken for this visit.There is no height or weight on file to calculate BMI.  General Appearance: Casual  Eye Contact:  Good  Speech:  Clear and Coherent  Volume:  Normal  Mood:  Anxious  Affect:  Appropriate  Thought Process:  Goal Directed and Descriptions of Associations: Intact  Orientation:  Full (Time, Place, and Person)  Thought Content: Logical   Suicidal Thoughts:  No  Homicidal Thoughts:  No  Memory:  Immediate;   Fair Recent;   Fair Remote;   Fair  Judgement:  Fair  Insight:  Good  Psychomotor Activity:  Normal  Concentration:  Concentration: Fair and  Attention Span: Fair  Recall:  [employer] of Knowledge: Fair  Language: Fair  Akathisia:  No  Handed:  Right  AIMS (if indicated): denies tremors, rigidity  Assets:  Communication Skills Desire for Improvement Social Support Talents/Skills  ADL's:  Intact  Cognition: WNL  Sleep:  Some improvement   Screenings: PHQ2-9     Office Visit from 08/09/2018 in [facility] Visit from 03/02/2018 in [facility] Visit from 08/08/2017 in [facility] Visit from 08/03/2016 in [facility] Visit from 12/03/2014 in [facility] Total Score  0  0  0  0  0  PHQ-9 Total Score  0  -  -  -  -       Assessment and Plan: Gilberto is a 33 year old Hispanic female, employed, lives in [place], has a history of bipolar disorder, skin picking disorder, panic attacks was evaluated by telemedicine today.  Patient is biologically predisposed given her history of trauma.  She also has psychosocial stressors of current COVID-19 pandemic.  Patient with good support system currently making progress however continues to need medication readjustment as well as psychotherapy sessions.  Plan For bipolar disorder type II-unstable Increase Seroquel to 100 mg p.o. nightly for 3 weeks and to 150 mg p.o. nightly after that. Continue venlafaxine as prescribed-she takes it for her migraine headaches. Referral for CBT with therapist here in clinic.  For panic attacks- improving Propranolol 10 mg p.o. twice daily as needed Referral for CBT  For skin picking disorder- improving We will refer for CBT  For tobacco use disorder-improving Provided smoking cessation counseling that she is cutting back.  Patient to go to her PMD to get an EKG to monitor for QTC prolongation since she is on Seroquel.  Follow-up in clinic in 4 weeks or sooner if needed.  September 14 at 2:45 PM  I  have spent atleast 15 minutes non face to face with patient today. More than 50 % of the time was spent for  psychoeducation and supportive psychotherapy and care coordination.  This note was generated in part or whole with voice recognition software. Voice recognition is usually quite accurate but there are transcription errors that can and very often do occur. I apologize for any typographical errors that were not detected and corrected.        Ursula Alert, MD 05/01/2019, 6:26 PM

## 2019-05-10 ENCOUNTER — Encounter: Payer: Self-pay | Admitting: Internal Medicine

## 2019-05-10 DIAGNOSIS — G8929 Other chronic pain: Secondary | ICD-10-CM

## 2019-05-10 DIAGNOSIS — M25512 Pain in left shoulder: Secondary | ICD-10-CM

## 2019-05-25 ENCOUNTER — Other Ambulatory Visit: Payer: Self-pay | Admitting: Internal Medicine

## 2019-05-28 ENCOUNTER — Telehealth: Payer: Self-pay

## 2019-05-28 ENCOUNTER — Encounter: Payer: Self-pay | Admitting: Psychiatry

## 2019-05-28 ENCOUNTER — Ambulatory Visit (INDEPENDENT_AMBULATORY_CARE_PROVIDER_SITE_OTHER): Payer: Commercial Managed Care - PPO | Admitting: Psychiatry

## 2019-05-28 ENCOUNTER — Other Ambulatory Visit: Payer: Self-pay

## 2019-05-28 DIAGNOSIS — F424 Excoriation (skin-picking) disorder: Secondary | ICD-10-CM

## 2019-05-28 DIAGNOSIS — F410 Panic disorder [episodic paroxysmal anxiety] without agoraphobia: Secondary | ICD-10-CM | POA: Diagnosis not present

## 2019-05-28 DIAGNOSIS — F3181 Bipolar II disorder: Secondary | ICD-10-CM | POA: Diagnosis not present

## 2019-05-28 MED ORDER — LAMOTRIGINE 25 MG PO TABS: 25.0000 mg | ORAL_TABLET | Freq: Every day | ORAL | 1 refills | Status: DC

## 2019-05-28 NOTE — Telephone Encounter (Signed)
per dr. Shea Evans order a list of therapist was mailed to patient.

## 2019-05-28 NOTE — Progress Notes (Signed)
Virtual Visit via Video Note  I connected with Lindsay Nicholson on 05/28/19 at  2:45 PM EDT by a video enabled telemedicine application and verified that I am speaking with the correct person using two identifiers.   I discussed the limitations of evaluation and management by telemedicine and the availability of in person appointments. The patient expressed understanding and agreed to proceed.  I discussed the assessment and treatment plan with the patient. The patient was provided an opportunity to ask questions and all were answered. The patient agreed with the plan and demonstrated an understanding of the instructions.   The patient was advised to call back or seek an in-person evaluation if the symptoms worsen or if the condition fails to improve as anticipated.  BH MD OP Progress Note  05/28/2019 4:58 PM Yovanka Kirchner  MRN:  [id number]  Chief Complaint:  Chief Complaint    Follow-up     HPI: Tresea is a 33 year old Hispanic female, lives in [place] with her boyfriend, has a history of bipolar disorder, panic attacks, skin picking disorder was evaluated by telemedicine today.  Patient today reports she has been struggling with some grogginess in the morning from the Seroquel.  She reports she is tried skipping the Seroquel and felt she was able to sleep without it.  She hence does not want to continue to take the Seroquel at this time.  She has been compliant with the venlafaxine which has been helpful.  The venlafaxine was something that was prescribed for headaches.  However she is aware that it is also helpful with her anxiety symptoms.  Discussed adding Lamictal for mood lability.  She agrees with plan.  Patient has not started psychotherapy sessions yet.  Will mail a list of therapist to her today.  She will call to make an appointment.  Patient denies any suicidality, homicidality or perceptual disturbances.  Visit Diagnosis:    ICD-10-CM   1. Bipolar 2 disorder (HCC)  F31.81  lamoTRIgine (LAMICTAL) 25 MG tablet   mixed , mild  2. Panic attacks  F41.0 lamoTRIgine (LAMICTAL) 25 MG tablet  3. Skin-picking disorder  F42.4     Past Psychiatric History: I have reviewed past psychiatric history from my progress note on 03/15/2019.  Past trials of Wellbutrin, BuSpar, hydroxyzine, Effexor  Past Medical History:  Past Medical History:  Diagnosis Date  . Anxiety   . Cervicalgia   . SVD (spontaneous vaginal delivery) 09/14/2011    Past Surgical History:  Procedure Laterality Date  . LAPAROSCOPIC APPENDECTOMY N/A 11/16/2015   Procedure: APPENDECTOMY LAPAROSCOPIC;  Surgeon: Ovidio Kin, MD;  Location: [facility];  Service: General;  Laterality: N/A;  . SHOULDER FUSION SURGERY      Family Psychiatric History: I have reviewed family psychiatric history from my progress note on 03/15/2019 Family History:  Family History  Problem Relation Age of Onset  . Diabetes Father   . Hypertension Father   . High Cholesterol Father   . Anesthesia problems Neg Hx   . Cancer Neg Hx   . Heart disease Neg Hx   . Stroke Neg Hx   . Migraines Neg Hx     Social History: I have reviewed social history from my progress note on 03/15/2019 Social History   Socioeconomic History  . Marital status: Single    Spouse name: Not on file  . Number of children: 3  . Years of education: Not on file  . Highest education level: Associate degree: occupational, Scientist, product/process development, or vocational program  Occupational  History  . Not on file  Social Needs  . Financial resource strain: Not on file  . Food insecurity    Worry: Not on file    Inability: Not on file  . Transportation needs    Medical: Not on file    Non-medical: Not on file  Tobacco Use  . Smoking status: Current Every Day Smoker    Packs/day: 0.25    Types: Cigarettes  . Smokeless tobacco: Never Used  Substance and Sexual Activity  . Alcohol use: Yes    Alcohol/week: 0.0 standard drinks    Comment: occasional  . Drug use: No  . Sexual  activity: Yes    Birth control/protection: Injection  Lifestyle  . Physical activity    Days per week: Not on file    Minutes per session: Not on file  . Stress: Not on file  Relationships  . Social Musicianconnections    Talks on phone: Not on file    Gets together: Not on file    Attends religious service: Not on file    Active member of club or organization: Not on file    Attends meetings of clubs or organizations: Not on file    Relationship status: Not on file  Other Topics Concern  . Not on file  Social History Narrative   Lives at home with her fiance and her children   Left handed   Caffeine: all day long    Allergies: No Known Allergies  Metabolic Disorder Labs: No results found for: HGBA1C, MPG No results found for: PROLACTIN Lab Results  Component Value Date   CHOL 166 08/09/2018   TRIG 254.0 (H) 08/09/2018   HDL 31.80 (L) 08/09/2018   CHOLHDL 5 08/09/2018   VLDL 50.8 (H) 08/09/2018   LDLCALC 97 08/08/2017   LDLCALC 77 08/03/2016   Lab Results  Component Value Date   TSH 0.93 02/06/2019   TSH 1.02 12/03/2014    Therapeutic Level Labs: No results found for: LITHIUM No results found for: VALPROATE No components found for:  CBMZ  Current Medications: Current Outpatient Medications  Medication Sig Dispense Refill  . Aspirin-Acetaminophen-Caffeine (EXCEDRIN MIGRAINE PO) Take by mouth as needed.    . baclofen (LIORESAL) 10 MG tablet Take 1 tablet (10 mg total) by mouth 2 (two) times daily. 60 each 0  . diclofenac sodium (VOLTAREN) 1 % GEL Apply 4 g topically 4 (four) times daily. 50 g 6  . lamoTRIgine (LAMICTAL) 25 MG tablet Take 1 tablet (25 mg total) by mouth at bedtime. 30 tablet 1  . lidocaine (LIDODERM) 5 % Place 1 patch onto the skin daily. Remove & Discard patch within 12 hours or as directed by MD 30 patch 0  . medroxyPROGESTERone Acetate 150 MG/ML SUSY     . meloxicam (MOBIC) 15 MG tablet Take 1 tablet by mouth once daily 90 tablet 0  . methocarbamol  (ROBAXIN) 500 MG tablet Take 1 tablet (500 mg total) by mouth every 8 (eight) hours as needed for muscle spasms. 90 tablet 6  . ondansetron (ZOFRAN-ODT) 4 MG disintegrating tablet Take 1 tablet (4 mg total) by mouth every 8 (eight) hours as needed for nausea. Or for dizziness or headache and migraine. 30 tablet 3  . propranolol (INDERAL) 10 MG tablet Take 1 tablet (10 mg total) by mouth 2 (two) times daily as needed. For severe anxiety attacks only (Patient not taking: Reported on 03/19/2019) 60 tablet 1  . rizatriptan (MAXALT-MLT) 10 MG disintegrating tablet Take 1  tablet (10 mg total) by mouth as needed for migraine. May repeat in 2 hours if needed 9 tablet 11  . topiramate (TOPAMAX) 100 MG tablet Take 1 tablet by mouth once daily 30 tablet 0  . venlafaxine XR (EFFEXOR-XR) 150 MG 24 hr capsule Take 1 capsule (150 mg total) by mouth daily with breakfast. 90 capsule 11   No current facility-administered medications for this visit.      Musculoskeletal: Strength & Muscle Tone: UTA Gait & Station: WNL Patient leans: N/A  Psychiatric Specialty Exam: Review of Systems  Psychiatric/Behavioral: Positive for depression.  All other systems reviewed and are negative.   There were no vitals taken for this visit.There is no height or weight on file to calculate BMI.  General Appearance: Casual  Eye Contact:  Fair  Speech:  Clear and Coherent  Volume:  Normal  Mood:  Depressed  Affect:  Congruent  Thought Process:  Goal Directed and Descriptions of Associations: Intact  Orientation:  Full (Time, Place, and Person)  Thought Content: Logical   Suicidal Thoughts:  No  Homicidal Thoughts:  No  Memory:  Immediate;   Fair Recent;   Fair Remote;   Fair  Judgement:  Fair  Insight:  Fair  Psychomotor Activity:  Normal  Concentration:  Concentration: Fair and Attention Span: Fair  Recall:  [employer]  Fund of Knowledge: Fair  Language: Fair  Akathisia:  No  Handed:  Right  AIMS (if indicated): Denies  tremors, rigidity  Assets:  Communication Skills Desire for Improvement Social Support  ADL's:  Intact  Cognition: WNL  Sleep:  Fair   Screenings: PHQ2-9     [facility]  PHQ-2 Total Score  0  0  0  0  0  PHQ-9 Total Score  0  -  -  -  -       Assessment and Plan: Antionette Charhally is a 33 year old Hispanic female, employed, lives in [place], has a history of bipolar disorder, skin picking disorder, panic attacks was evaluated by telemedicine today.  Patient is biologically predisposed given her history of trauma.  She also has psychosocial stressors of current pandemic.  Patient is currently struggling with side effects to Seroquel.  She will benefit from the following medication readjustment.  Plan For bipolar disorder type II-unstable Discontinue Seroquel for side effects Start Lamictal 25 mg p.o. daily Continue venlafaxine as prescribed-she takes it for migraine headaches Referral for CBT with therapist again-she was unable to schedule an appointment.  Will give her a list of therapist in the community today.  Panic attacks-improving Propanolol 10 mg p.o. twice daily as needed Referral for CBT  Skin picking disorder-improving Will refer for CBT  Tobacco use disorder-improving She is cutting back.  Her health insurance plan will not approve Chantix per patient  Follow-up in clinic in 3 weeks or sooner if needed.  October 7 at 10:45 AM  I have spent atleast 15 minutes non  face to face with patient today. More than 50 % of the time was spent for psychoeducation and supportive psychotherapy and care coordination. This note was generated in part or whole with voice  recognition software. Voice recognition is usually quite accurate but there are transcription errors that  can and very often do occur. I apologize for any typographical errors that were not detected and corrected.       Ursula Alert, MD 05/28/2019, 4:58 PM

## 2019-06-13 ENCOUNTER — Encounter: Payer: Self-pay | Admitting: Physical Medicine & Rehabilitation

## 2019-06-20 ENCOUNTER — Other Ambulatory Visit: Payer: Self-pay

## 2019-06-20 ENCOUNTER — Encounter: Payer: Self-pay | Admitting: Psychiatry

## 2019-06-20 ENCOUNTER — Ambulatory Visit (INDEPENDENT_AMBULATORY_CARE_PROVIDER_SITE_OTHER): Payer: Commercial Managed Care - PPO | Admitting: Psychiatry

## 2019-06-20 DIAGNOSIS — F424 Excoriation (skin-picking) disorder: Secondary | ICD-10-CM | POA: Diagnosis not present

## 2019-06-20 DIAGNOSIS — F17200 Nicotine dependence, unspecified, uncomplicated: Secondary | ICD-10-CM

## 2019-06-20 DIAGNOSIS — F3181 Bipolar II disorder: Secondary | ICD-10-CM | POA: Diagnosis not present

## 2019-06-20 DIAGNOSIS — F410 Panic disorder [episodic paroxysmal anxiety] without agoraphobia: Secondary | ICD-10-CM

## 2019-06-20 MED ORDER — LAMOTRIGINE 25 MG PO TABS: 50.0000 mg | ORAL_TABLET | Freq: Every day | ORAL | 1 refills | Status: DC

## 2019-06-20 MED ORDER — LAMOTRIGINE 25 MG PO TABS: 75.0000 mg | ORAL_TABLET | Freq: Every day | ORAL | 1 refills | Status: DC

## 2019-06-20 MED ORDER — ZOLPIDEM TARTRATE 5 MG PO TABS: 5.0000 mg | ORAL_TABLET | Freq: Every evening | ORAL | 1 refills | Status: DC | PRN

## 2019-06-20 NOTE — Progress Notes (Signed)
Virtual Visit via Video Note  I connected with Lindsay Nicholson on 06/20/19 at 10:45 AM EDT by a video enabled telemedicine application and verified that I am speaking with the correct person using two identifiers.   I discussed the limitations of evaluation and management by telemedicine and the availability of in person appointments. The patient expressed understanding and agreed to proceed.  I discussed the assessment and treatment plan with the patient. The patient was provided an opportunity to ask questions and all were answered. The patient agreed with the plan and demonstrated an understanding of the instructions.   The patient was advised to call back or seek an in-person evaluation if the symptoms worsen or if the condition fails to improve as anticipated. BH MD OP Progress Note  06/20/2019 3:30 PM Lindsay Nicholson  MRN:  [id number]  Chief Complaint:  Chief Complaint    Follow-up     HPI: Lindsay Nicholson is a 33 year old Hispanic female, lives in [place] her boyfriend, has a history of bipolar disorder, panic attacks, skin picking disorder was evaluated by telemedicine today.  Patient today reports she continues to struggle with anxiety and mood lability.  She currently takes venlafaxine for headaches as well as Lamictal.  She reports she increased the Lamictal herself to 50 mg a week ago.  She reports it helps to some extent.  She denies any side effects from the same.  Patient reports she continues to struggle with sleep.  She did not tolerate the Seroquel and she no longer is on a medication to help with sleep.  Patient denies any suicidality, homicidality or perceptual disturbances.  Patient continues to smoke cigarettes.  Provided smoking cessation counseling.  Patient reports she has not been able to find a therapist yet.  She however has a list and will try to reach out to them today.   Visit Diagnosis:    ICD-10-CM   1. Bipolar 2 disorder (HCC)  F31.81 lamoTRIgine (LAMICTAL) 25  MG tablet    zolpidem (AMBIEN) 5 MG tablet   mixed, mild  2. Panic attacks  F41.0   3. Skin-picking disorder  F42.4   4. Tobacco use disorder  F17.200     Past Psychiatric History: I have reviewed past psychiatric history from my progress note on 03/15/2019.  Past trials of Wellbutrin, BuSpar, hydroxyzine, Effexor  Past Medical History:  Past Medical History:  Diagnosis Date  . Anxiety   . Cervicalgia   . SVD (spontaneous vaginal delivery) 09/14/2011    Past Surgical History:  Procedure Laterality Date  . LAPAROSCOPIC APPENDECTOMY N/A 11/16/2015   Procedure: APPENDECTOMY LAPAROSCOPIC;  Surgeon: Ovidio Kinavid Newman, MD;  Location: [facility];  Service: General;  Laterality: N/A;  . SHOULDER FUSION SURGERY      Family Psychiatric History: I have reviewed family psychiatric history from my progress note on 03/15/2019  Family History:  Family History  Problem Relation Age of Onset  . Diabetes Father   . Hypertension Father   . High Cholesterol Father   . Anesthesia problems Neg Hx   . Cancer Neg Hx   . Heart disease Neg Hx   . Stroke Neg Hx   . Migraines Neg Hx     Social History: I have reviewed social history from my progress note on 03/15/2019 Social History   Socioeconomic History  . Marital status: Single    Spouse name: Not on file  . Number of children: 3  . Years of education: Not on file  . Highest education level:  Associate degree: occupational, technical, or vocational program  Occupational History  . Not on file  Social Needs  . Financial resource strain: Not on file  . Food insecurity    Worry: Not on file    Inability: Not on file  . Transportation needs    Medical: Not on file    Non-medical: Not on file  Tobacco Use  . Smoking status: Current Every Day Smoker    Packs/day: 0.25    Types: Cigarettes  . Smokeless tobacco: Never Used  Substance and Sexual Activity  . Alcohol use: Yes    Alcohol/week: 0.0 standard drinks    Comment: occasional  . Drug use: No  .  Sexual activity: Yes    Birth control/protection: Injection  Lifestyle  . Physical activity    Days per week: Not on file    Minutes per session: Not on file  . Stress: Not on file  Relationships  . Social Herbalist on phone: Not on file    Gets together: Not on file    Attends religious service: Not on file    Active member of club or organization: Not on file    Attends meetings of clubs or organizations: Not on file    Relationship status: Not on file  Other Topics Concern  . Not on file  Social History Narrative   Lives at home with her fiance and her children   Left handed   Caffeine: all day long    Allergies: No Known Allergies  Metabolic Disorder Labs: No results found for: HGBA1C, MPG No results found for: PROLACTIN Lab Results  Component Value Date   CHOL 166 08/09/2018   TRIG 254.0 (H) 08/09/2018   HDL 31.80 (L) 08/09/2018   CHOLHDL 5 08/09/2018   VLDL 50.8 (H) 08/09/2018   LDLCALC 97 08/08/2017   LDLCALC 77 08/03/2016   Lab Results  Component Value Date   TSH 0.93 02/06/2019   TSH 1.02 12/03/2014    Therapeutic Level Labs: No results found for: LITHIUM No results found for: VALPROATE No components found for:  CBMZ  Current Medications: Current Outpatient Medications  Medication Sig Dispense Refill  . Aspirin-Acetaminophen-Caffeine (EXCEDRIN MIGRAINE PO) Take by mouth as needed.    . baclofen (LIORESAL) 10 MG tablet Take 1 tablet (10 mg total) by mouth 2 (two) times daily. 60 each 0  . diclofenac sodium (VOLTAREN) 1 % GEL Apply 4 g topically 4 (four) times daily. 50 g 6  . lamoTRIgine (LAMICTAL) 25 MG tablet Take 2 tablets (50 mg total) by mouth daily. 60 tablet 1  . lidocaine (LIDODERM) 5 % Place 1 patch onto the skin daily. Remove & Discard patch within 12 hours or as directed by MD 30 patch 0  . medroxyPROGESTERone Acetate 150 MG/ML SUSY     . meloxicam (MOBIC) 15 MG tablet Take 1 tablet by mouth once daily 90 tablet 0  .  methocarbamol (ROBAXIN) 500 MG tablet Take 1 tablet (500 mg total) by mouth every 8 (eight) hours as needed for muscle spasms. 90 tablet 6  . ondansetron (ZOFRAN-ODT) 4 MG disintegrating tablet Take 1 tablet (4 mg total) by mouth every 8 (eight) hours as needed for nausea. Or for dizziness or headache and migraine. 30 tablet 3  . propranolol (INDERAL) 10 MG tablet Take 1 tablet (10 mg total) by mouth 2 (two) times daily as needed. For severe anxiety attacks only (Patient not taking: Reported on 03/19/2019) 60 tablet 1  .  rizatriptan (MAXALT-MLT) 10 MG disintegrating tablet Take 1 tablet (10 mg total) by mouth as needed for migraine. May repeat in 2 hours if needed 9 tablet 11  . topiramate (TOPAMAX) 100 MG tablet Take 1 tablet by mouth once daily 30 tablet 0  . topiramate (TOPAMAX) 25 MG tablet Take 75 mg by mouth daily.    Marland Kitchen venlafaxine XR (EFFEXOR-XR) 150 MG 24 hr capsule Take 1 capsule (150 mg total) by mouth daily with breakfast. 90 capsule 11  . zolpidem (AMBIEN) 5 MG tablet Take 1 tablet (5 mg total) by mouth at bedtime as needed for sleep. 30 tablet 1   No current facility-administered medications for this visit.      Musculoskeletal: Strength & Muscle Tone: UTA Gait & Station: normal Patient leans: N/A  Psychiatric Specialty Exam: Review of Systems  Psychiatric/Behavioral: The patient is nervous/anxious and has insomnia.   All other systems reviewed and are negative.   There were no vitals taken for this visit.There is no height or weight on file to calculate BMI.  General Appearance: Casual  Eye Contact:  Fair  Speech:  Clear and Coherent  Volume:  Normal  Mood:  Anxious  Affect:  Congruent  Thought Process:  Goal Directed and Descriptions of Associations: Intact  Orientation:  Full (Time, Place, and Person)  Thought Content: Logical   Suicidal Thoughts:  No  Homicidal Thoughts:  No  Memory:  Immediate;   Fair Recent;   Fair Remote;   Fair  Judgement:  Fair  Insight:   Fair  Psychomotor Activity:  Normal  Concentration:  Concentration: Fair and Attention Span: Fair  Recall:  [employer] of Knowledge: Fair  Language: Fair  Akathisia:  No  Handed:  Right  AIMS (if indicated): denies tremors, rigidity  Assets:  Communication Skills Desire for Improvement Social Support  ADL's:  Intact  Cognition: WNL  Sleep:  Restless   Screenings: PHQ2-9     [facility] Visit from 08/09/2018 in [facility] Visit from 03/02/2018 in [facility] at [facility] Visit from 08[facility]08/2017 in [facility] Visit from 08/03/2016 in [facility] Visit from 12/03/2014 in [facility] at [facility] Total Score  0  0  0  0  0  PHQ-9 Total Score  0  -  -  -  -       Assessment and Plan: Lindsay Nicholson is a 33 year old Hispanic female, employed, lives in [place], has a history of bipolar disorder, skin picking disorder, panic attacks was evaluated by telemedicine today.  She is biologically predisposed given her history of trauma.  She also has psychosocial stressors of the current pandemic.  Patient continues to struggle with anxiety, sleep and mood lability.  Continue plan as noted below.  Plan For bipolar disorder type II-some progress Increase Lamictal to 75 mg p.o. daily Continue venlafaxine as prescribed -she takes it for headaches.  Discussed with her that she needs to discuss with her provider to readjust the dosage. Add Ambien 5 mg p.o. nightly as needed for sleep She was referred for CBT-was given a list of therapist-pending  Panic attacks- improving Propranolol 10 mg p.o. twice daily as needed Referral for CBT  Skin picking disorder- some improvement Referred for CBT  Tobacco use disorder- improving Provided smoking cessation counseling  Follow-up in clinic in 4 weeks or sooner if needed. Nov 6 at 11:20 AM  I have spent atleast 15  minutes non  face to face  with patient today. More than 50 % of the time was spent for psychoeducation and supportive psychotherapy and care coordination. This note was generated in part or whole with voice recognition software. Voice recognition is usually quite accurate but there are transcription errors that can and very often do occur. I apologize for any typographical errors that were not detected and corrected.       Jomarie Longs, MD 06/20/2019, 3:30 PM

## 2019-06-22 ENCOUNTER — Encounter
Payer: Commercial Managed Care - PPO | Attending: Physical Medicine & Rehabilitation | Admitting: Physical Medicine & Rehabilitation

## 2019-07-02 ENCOUNTER — Other Ambulatory Visit: Payer: Self-pay | Admitting: Internal Medicine

## 2019-07-19 NOTE — Progress Notes (Signed)
Virtual Visit via Video Note  I connected with Lindsay Nicholson on 07/20/19 at 11:20 AM EST by a video enabled telemedicine application and verified that I am speaking with the correct person using two identifiers.   I discussed the limitations of evaluation and management by telemedicine and the availability of in person appointments. The patient expressed understanding and agreed to proceed.    I discussed the assessment and treatment plan with the patient. The patient was provided an opportunity to ask questions and all were answered. The patient agreed with the plan and demonstrated an understanding of the instructions.   The patient was advised to call back or seek an in-person evaluation if the symptoms worsen or if the condition fails to improve as anticipated.  BH MD OP Progress Note  07/20/2019 12:10 PM Lindsay Nicholson  MRN:  [id number]  Chief Complaint:  Chief Complaint    Follow-up     HPI: Lindsay Nicholson is a 24 year old Hispanic female, lives in [place] with boyfriend, has a history of bipolar disorder, panic attacks, skin picking disorder was evaluated by telemedicine today.  Patient today reports she is currently making some progress on the Lamictal.  She reports she however continues to struggle with anxiety symptoms.  She feels nervous and on edge often.  She reports she often has to distract herself or walk outside to help cope with her anxiety symptoms.  Patient was prescribed propranolol however she reports she has not used it yet.  Encourage patient to use it as needed.  Patient reports sleep is improved on the Ambien.  Patient denied any suicidality, homicidality or perceptual disturbances.  Patient continues to smoke cigarettes and is currently working on cutting back.  Provided smoking cessation counseling.  Patient has not been able to find a therapist yet.  She reports she called multiple providers however they either did not take her health insurance plan or they do not  accept new patients at this time.  Writer hence went into psychology today and provided patient a few numbers that she could call.  Patient agrees to contact therapist for appointment.  Visit Diagnosis:    ICD-10-CM   1. Bipolar 2 disorder (HCC)  F31.81 lamoTRIgine (LAMICTAL) 100 MG tablet   mixed,mild  2. Panic attacks  F41.0   3. Skin-picking disorder  F42.4   4. Tobacco use disorder  F17.200     Past Psychiatric History: I have reviewed past psychiatric history from my progress note on 03/15/2019.  Past trials of Wellbutrin, BuSpar, hydroxyzine, Effexor.       Past Medical History:  Past Medical History:  Diagnosis Date  . Anxiety   . Cervicalgia   . SVD (spontaneous vaginal delivery) 09/14/2011    Past Surgical History:  Procedure Laterality Date  . LAPAROSCOPIC APPENDECTOMY N/A 11/16/2015   Procedure: APPENDECTOMY LAPAROSCOPIC;  Surgeon: Ovidio Kin, MD;  Location: [facility];  Service: General;  Laterality: N/A;  . SHOULDER FUSION SURGERY      Family Psychiatric History: I have reviewed family psychiatric history from my progress note on 03/15/2019  Family History:  Family History  Problem Relation Age of Onset  . Diabetes Father   . Hypertension Father   . High Cholesterol Father   . Anesthesia problems Neg Hx   . Cancer Neg Hx   . Heart disease Neg Hx   . Stroke Neg Hx   . Migraines Neg Hx     Social History: Reviewed social history from my progress note on 03/15/2019 Social History  Socioeconomic History  . Marital status: Single    Spouse name: Not on file  . Number of children: 3  . Years of education: Not on file  . Highest education level: Associate degree: occupational, Scientist, product/process developmenttechnical, or vocational program  Occupational History  . Not on file  Social Needs  . Financial resource strain: Not on file  . Food insecurity    Worry: Not on file    Inability: Not on file  . Transportation needs    Medical: Not on file    Non-medical: Not on file  Tobacco Use   . Smoking status: Current Every Day Smoker    Packs/day: 0.25    Types: Cigarettes  . Smokeless tobacco: Never Used  Substance and Sexual Activity  . Alcohol use: Yes    Alcohol/week: 0.0 standard drinks    Comment: occasional  . Drug use: No  . Sexual activity: Yes    Birth control/protection: Injection  Lifestyle  . Physical activity    Days per week: Not on file    Minutes per session: Not on file  . Stress: Not on file  Relationships  . Social Musicianconnections    Talks on phone: Not on file    Gets together: Not on file    Attends religious service: Not on file    Active member of club or organization: Not on file    Attends meetings of clubs or organizations: Not on file    Relationship status: Not on file  Other Topics Concern  . Not on file  Social History Narrative   Lives at home with her fiance and her children   Left handed   Caffeine: all day long    Allergies: No Known Allergies  Metabolic Disorder Labs: No results found for: HGBA1C, MPG No results found for: PROLACTIN Lab Results  Component Value Date   CHOL 166 08/09/2018   TRIG 254.0 (H) 08/09/2018   HDL 31.80 (L) 08/09/2018   CHOLHDL 5 08/09/2018   VLDL 50.8 (H) 08/09/2018   LDLCALC 97 08/08/2017   LDLCALC 77 08/03/2016   Lab Results  Component Value Date   TSH 0.93 02/06/2019   TSH 1.02 12/03/2014    Therapeutic Level Labs: No results found for: LITHIUM No results found for: VALPROATE No components found for:  CBMZ  Current Medications: Current Outpatient Medications  Medication Sig Dispense Refill  . Aspirin-Acetaminophen-Caffeine (EXCEDRIN MIGRAINE PO) Take by mouth as needed.    . baclofen (LIORESAL) 10 MG tablet Take 1 tablet (10 mg total) by mouth 2 (two) times daily. 60 each 0  . diclofenac sodium (VOLTAREN) 1 % GEL Apply 4 g topically 4 (four) times daily. 50 g 6  . lamoTRIgine (LAMICTAL) 100 MG tablet Take 1 tablet (100 mg total) by mouth daily. 90 tablet 0  . lidocaine  (LIDODERM) 5 % Place 1 patch onto the skin daily. Remove & Discard patch within 12 hours or as directed by MD 30 patch 0  . medroxyPROGESTERone Acetate 150 MG/ML SUSY     . meloxicam (MOBIC) 15 MG tablet Take 1 tablet by mouth once daily 90 tablet 0  . methocarbamol (ROBAXIN) 500 MG tablet Take 1 tablet (500 mg total) by mouth every 8 (eight) hours as needed for muscle spasms. 90 tablet 6  . ondansetron (ZOFRAN-ODT) 4 MG disintegrating tablet Take 1 tablet (4 mg total) by mouth every 8 (eight) hours as needed for nausea. Or for dizziness or headache and migraine. 30 tablet 3  . propranolol (  INDERAL) 10 MG tablet Take 1 tablet (10 mg total) by mouth 2 (two) times daily as needed. For severe anxiety attacks only (Patient not taking: Reported on 03/19/2019) 60 tablet 1  . rizatriptan (MAXALT-MLT) 10 MG disintegrating tablet Take 1 tablet (10 mg total) by mouth as needed for migraine. May repeat in 2 hours if needed 9 tablet 11  . topiramate (TOPAMAX) 100 MG tablet Take 1 tablet by mouth once daily 30 tablet 0  . topiramate (TOPAMAX) 25 MG tablet Take 75 mg by mouth daily.    Marland Kitchen venlafaxine XR (EFFEXOR-XR) 150 MG 24 hr capsule Take 1 capsule (150 mg total) by mouth daily with breakfast. 90 capsule 11  . zolpidem (AMBIEN) 5 MG tablet Take 1 tablet (5 mg total) by mouth at bedtime as needed for sleep. 30 tablet 1   No current facility-administered medications for this visit.      Musculoskeletal: Strength & Muscle Tone: UTA Gait & Station: normal Patient leans: N/A  Psychiatric Specialty Exam: Review of Systems  Psychiatric/Behavioral: The patient is nervous/anxious.   All other systems reviewed and are negative.   There were no vitals taken for this visit.There is no height or weight on file to calculate BMI.  General Appearance: Casual  Eye Contact:  Fair  Speech:  Clear and Coherent  Volume:  Normal  Mood:  Anxious  Affect:  Congruent  Thought Process:  Goal Directed and Descriptions of  Associations: Intact  Orientation:  Full (Time, Place, and Person)  Thought Content: Logical   Suicidal Thoughts:  No  Homicidal Thoughts:  No  Memory:  Immediate;   Fair Recent;   Fair Remote;   Fair  Judgement:  Fair  Insight:  Fair  Psychomotor Activity:  Normal  Concentration:  Concentration: Fair and Attention Span: Fair  Recall:  [employer] of Knowledge: Fair  Language: Fair  Akathisia:  No  Handed:  Right  AIMS (if indicated):denies tremors, rigidity  Assets:  Communication Skills Desire for Improvement Housing Intimacy Social Support  ADL's:  Intact  Cognition: WNL  Sleep:  Improving   Screenings: PHQ2-9     Office Visit from 08/09/2018 in [place] at [facility] Visit from 03/02/2018 in [place] at [facility] Visit from 08/08/2017 in [place] at [facility] Visit from 08/03/2016 in [place] at [place] from 12/03/2014 in [place] at [facility] Total Score  0  0  0  0  0  PHQ-9 Total Score  0  -  -  -  -       Assessment and Plan:  Lindsay Nicholson is a 33 year old Hispanic female, employed, lives in [place], has a history of bipolar disorder, skin picking disorder, panic attacks was evaluated by telemedicine today.  She is biologically predisposed given her history of trauma.  She also has psychosocial stressors of the current pandemic.  Patient continues to struggle with anxiety symptoms and will benefit from medication readjustment.  Plan Bipolar disorder type II-some progress Increase lamotrigine to 100 mg p.o. daily Venlafaxine as prescribed.  Patient takes venlafaxine for headaches.  Discussed with her that she should have a discussion with her neurologist about changing the venlafaxine to another medication.  Once venlafaxine is discontinued another antidepressant/antianxiety agent can be added to help with her anxiety symptoms better. Ambien 5 mg p.o. nightly as needed  for sleep Patient was referred for CBT-pending-provided her few numbers from psychology today to contact.  Panic attacks-improving Propranolol 10 mg p.o. twice daily as needed -patient has been noncompliant.  Encouraged her to start taking the propranolol. Referred for CBT   Skin picking disorder-improved Referred for CBT  Tobacco use disorder-improving Provided smoking cessation counseling.  Follow-up in clinic in 3 weeks or sooner if needed.  Nov 25th at 1:20 PM  I have spent atleast 15 minutes non face to face with patient today. More than 50 % of the time was spent for psychoeducation and supportive psychotherapy and care coordination. This note was generated in part or whole with voice recognition software. Voice recognition is usually quite accurate but there are transcription errors that can and very often do occur. I apologize for any typographical errors that were not detected and corrected.      Jomarie Longs, MD 07/20/2019, 12:10 PM

## 2019-07-20 ENCOUNTER — Ambulatory Visit (INDEPENDENT_AMBULATORY_CARE_PROVIDER_SITE_OTHER): Payer: Commercial Managed Care - PPO | Admitting: Psychiatry

## 2019-07-20 ENCOUNTER — Encounter: Payer: Self-pay | Admitting: Psychiatry

## 2019-07-20 ENCOUNTER — Other Ambulatory Visit: Payer: Self-pay

## 2019-07-20 DIAGNOSIS — F17200 Nicotine dependence, unspecified, uncomplicated: Secondary | ICD-10-CM | POA: Diagnosis not present

## 2019-07-20 DIAGNOSIS — F424 Excoriation (skin-picking) disorder: Secondary | ICD-10-CM | POA: Diagnosis not present

## 2019-07-20 DIAGNOSIS — F410 Panic disorder [episodic paroxysmal anxiety] without agoraphobia: Secondary | ICD-10-CM | POA: Diagnosis not present

## 2019-07-20 DIAGNOSIS — F3181 Bipolar II disorder: Secondary | ICD-10-CM

## 2019-07-20 MED ORDER — LAMOTRIGINE 100 MG PO TABS: 100.0000 mg | ORAL_TABLET | Freq: Every day | ORAL | 0 refills | Status: DC

## 2019-07-21 ENCOUNTER — Other Ambulatory Visit: Payer: Self-pay | Admitting: Internal Medicine

## 2019-07-23 ENCOUNTER — Other Ambulatory Visit: Payer: Self-pay | Admitting: Internal Medicine

## 2019-07-24 ENCOUNTER — Other Ambulatory Visit: Payer: Self-pay | Admitting: Internal Medicine

## 2019-08-08 ENCOUNTER — Ambulatory Visit (INDEPENDENT_AMBULATORY_CARE_PROVIDER_SITE_OTHER): Payer: Commercial Managed Care - PPO | Admitting: Psychiatry

## 2019-08-08 ENCOUNTER — Other Ambulatory Visit: Payer: Self-pay

## 2019-08-08 ENCOUNTER — Encounter: Payer: Self-pay | Admitting: Psychiatry

## 2019-08-08 DIAGNOSIS — F410 Panic disorder [episodic paroxysmal anxiety] without agoraphobia: Secondary | ICD-10-CM

## 2019-08-08 DIAGNOSIS — F424 Excoriation (skin-picking) disorder: Secondary | ICD-10-CM | POA: Diagnosis not present

## 2019-08-08 DIAGNOSIS — F17200 Nicotine dependence, unspecified, uncomplicated: Secondary | ICD-10-CM | POA: Diagnosis not present

## 2019-08-08 DIAGNOSIS — F3181 Bipolar II disorder: Secondary | ICD-10-CM | POA: Diagnosis not present

## 2019-08-08 MED ORDER — ZOLPIDEM TARTRATE 10 MG PO TABS: 10.0000 mg | ORAL_TABLET | Freq: Every evening | ORAL | 1 refills | Status: DC | PRN
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
## 2019-08-12 ENCOUNTER — Encounter: Payer: Self-pay | Admitting: Internal Medicine

## 2019-08-13 ENCOUNTER — Encounter: Payer: Commercial Managed Care - PPO | Admitting: Internal Medicine

## 2019-08-17 ENCOUNTER — Telehealth: Payer: Commercial Managed Care - PPO

## 2019-08-17 ENCOUNTER — Telehealth: Payer: Commercial Managed Care - PPO | Admitting: Family

## 2019-08-17 DIAGNOSIS — Z20822 Contact with and (suspected) exposure to covid-19: Secondary | ICD-10-CM

## 2019-08-17 MED ORDER — PROMETHAZINE-DM 6.25-15 MG/5ML PO SYRP: 5.0000 mL | ORAL_SOLUTION | Freq: Four times a day (QID) | ORAL | 0 refills | Status: DC | PRN

## 2019-08-17 NOTE — Progress Notes (Signed)
E-Visit for Corona Virus Screening   Your current symptoms could be consistent with the coronavirus.  Many health care providers can now test patients at their office but not all are.  [facility] has multiple testing sites. For information on our COVID testing locations and hours go to [email or web address]  Please quarantine yourself while awaiting your test results.  We are enrolling you in our MyChart Home Montioring for COVID19 . Daily you will receive a questionnaire within the MyChart website. Our COVID 19 response team willl be monitoriing your responses daily. Please continue good preventive care measures, including:  frequent hand-washing, avoid touching your face, cover coughs/sneezes, stay out of crowds and keep a 6 foot distance from others.    COVID-19 is a respiratory illness with symptoms that are similar to the flu. Symptoms are typically mild to moderate, but there have been cases of severe illness and death due to the virus. The following symptoms may appear 2-14 days after exposure: . Fever . Cough . Shortness of breath or difficulty breathing . Chills . Repeated shaking with chills . Muscle pain . Headache . Sore throat . New loss of taste or smell . Fatigue . Congestion or runny nose . Nausea or vomiting . Diarrhea  If you develop fever/cough/breathlessness, please stay home for 10 days with improving symptoms and until you have had 24 hours of no fever (without taking a fever reducer).  Go to the nearest hospital ED for assessment if fever/cough/breathlessness are severe or illness seems like a threat to life.  It is vitally important that if you feel that you have an infection such as this virus or any other virus that you stay home and away from places where you may spread it to others.  You should avoid contact with people age 65 and older.   You should wear a mask or cloth face covering over your nose and mouth if you must be around other  people or animals, including pets (even at home). Try to stay at least 6 feet away from other people. This will protect the people around you.  You can use medication such as A prescription cough medication called Phenergan DM 6.25 mg/15 mg. You make take one teaspoon / 5 ml every 4-6 hours as needed for cough  You may also take acetaminophen (Tylenol) as needed for fever.   Reduce your risk of any infection by using the same precautions used for avoiding the common cold or flu:  . Wash your hands often with soap and warm water for at least 20 seconds.  If soap and water are not readily available, use an alcohol-based hand sanitizer with at least 60% alcohol.  . If coughing or sneezing, cover your mouth and nose by coughing or sneezing into the elbow areas of your shirt or coat, into a tissue or into your sleeve (not your hands). . Avoid shaking hands with others and consider head nods or verbal greetings only. . Avoid touching your eyes, nose, or mouth with unwashed hands.  . Avoid close contact with people who are sick. . Avoid places or events with large numbers of people in one location, like concerts or sporting events. . Carefully consider travel plans you have or are making. . If you are planning any travel outside or inside the [place], visit the CDC's Travelers' Health webpage for the latest health notices. . If you have some symptoms but not all symptoms, continue to monitor at home and seek medical attention if your   symptoms worsen. . If you are having a medical emergency, call 911.  HOME CARE . Only take medications as instructed by your medical team. . Drink plenty of fluids and get plenty of rest. . A steam or ultrasonic humidifier can help if you have congestion.   GET HELP RIGHT AWAY IF YOU HAVE EMERGENCY WARNING SIGNS** FOR COVID-19. If you or someone is showing any of these signs seek emergency medical care immediately. Call 911 or proceed to your closest emergency facility  if: . You develop worsening high fever. . Trouble breathing . Bluish lips or face . Persistent pain or pressure in the chest . New confusion . Inability to wake or stay awake . You cough up blood. . Your symptoms become more severe  **This list is not all possible symptoms. Contact your medical provider for any symptoms that are sever or concerning to you.   MAKE SURE YOU   Understand these instructions.  Will watch your condition.  Will get help right away if you are not doing well or get worse.  Your e-visit answers were reviewed by a board certified advanced clinical practitioner to complete your personal care plan.  Depending on the condition, your plan could have included both over the counter or prescription medications.  If there is a problem please reply once you have received a response from your provider.  Your safety is important to us.  If you have drug allergies check your prescription carefully.    You can use MyChart to ask questions about today's visit, request a non-urgent call back, or ask for a work or school excuse for 24 hours related to this e-Visit. If it has been greater than 24 hours you will need to follow up with your provider, or enter a new e-Visit to address those concerns. You will get an e-mail in the next two days asking about your experience.  I hope that your e-visit has been valuable and will speed your recovery. Thank you for using e-visits.   Greater than 5 minutes, yet less than 10 minutes of time have been spent researching, coordinating, and implementing care for this patient today.  Thank you for the details you included in the comment boxes. Those details are very helpful in determining the best course of treatment for you and help us to provide the best care.  

## 2019-08-20 ENCOUNTER — Telehealth: Payer: Self-pay

## 2019-08-20 DIAGNOSIS — F5105 Insomnia due to other mental disorder: Secondary | ICD-10-CM | POA: Insufficient documentation

## 2019-08-20 MED ORDER — DOXEPIN HCL 10 MG PO CAPS: 10.0000 mg | ORAL_CAPSULE | Freq: Every evening | ORAL | 1 refills | Status: DC | PRN

## 2019-08-20 NOTE — Telephone Encounter (Signed)
pt called left message that the Lorrin Mais is not working she still can not sleep

## 2019-08-20 NOTE — Telephone Encounter (Signed)
Returned call to patient.  She is not sleeping on the Ambien.  She has not slept in the past few days.  Discussed stopping Ambien.  Start doxepin.  Discussed side effects.  Also discussed drug to drug interaction including serotonin syndrome with her venlafaxine which he takes for headaches.  She will monitor herself closely.  Sending doxepin 10 mg at bedtime to her pharmacy.

## 2019-08-21 ENCOUNTER — Encounter: Payer: Self-pay | Admitting: Physical Medicine and Rehabilitation

## 2019-08-21 ENCOUNTER — Other Ambulatory Visit: Payer: Self-pay

## 2019-08-21 ENCOUNTER — Encounter
Payer: Commercial Managed Care - PPO | Attending: Physical Medicine & Rehabilitation | Admitting: Physical Medicine and Rehabilitation

## 2019-08-21 VITALS — BP 122/85 | HR 88 | Temp 97.5°F | Ht 62.0 in | Wt 124.0 lb

## 2019-08-21 DIAGNOSIS — M25512 Pain in left shoulder: Secondary | ICD-10-CM

## 2019-08-21 DIAGNOSIS — G8929 Other chronic pain: Secondary | ICD-10-CM | POA: Diagnosis not present

## 2019-08-21 MED ORDER — GABAPENTIN 300 MG PO CAPS: 300.0000 mg | ORAL_CAPSULE | Freq: Every day | ORAL | 1 refills | Status: DC
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
## 2019-08-27 ENCOUNTER — Other Ambulatory Visit: Payer: Self-pay

## 2019-08-27 ENCOUNTER — Encounter: Payer: Self-pay | Admitting: Internal Medicine

## 2019-08-27 ENCOUNTER — Ambulatory Visit (INDEPENDENT_AMBULATORY_CARE_PROVIDER_SITE_OTHER): Payer: Commercial Managed Care - PPO | Admitting: Internal Medicine

## 2019-08-27 VITALS — BP 118/70 | HR 110 | Temp 97.4°F | Wt 123.0 lb

## 2019-08-27 DIAGNOSIS — F410 Panic disorder [episodic paroxysmal anxiety] without agoraphobia: Secondary | ICD-10-CM

## 2019-08-27 DIAGNOSIS — Z0000 Encounter for general adult medical examination without abnormal findings: Secondary | ICD-10-CM

## 2019-08-27 DIAGNOSIS — Z0001 Encounter for general adult medical examination with abnormal findings: Secondary | ICD-10-CM | POA: Diagnosis not present

## 2019-08-27 DIAGNOSIS — M7918 Myalgia, other site: Secondary | ICD-10-CM

## 2019-08-27 DIAGNOSIS — M25512 Pain in left shoulder: Secondary | ICD-10-CM

## 2019-08-27 DIAGNOSIS — F3181 Bipolar II disorder: Secondary | ICD-10-CM

## 2019-08-27 DIAGNOSIS — G8929 Other chronic pain: Secondary | ICD-10-CM

## 2019-08-27 DIAGNOSIS — R519 Headache, unspecified: Secondary | ICD-10-CM

## 2019-08-27 DIAGNOSIS — F411 Generalized anxiety disorder: Secondary | ICD-10-CM | POA: Diagnosis not present

## 2019-08-27 DIAGNOSIS — Z23 Encounter for immunization: Secondary | ICD-10-CM

## 2019-08-27 DIAGNOSIS — F5105 Insomnia due to other mental disorder: Secondary | ICD-10-CM

## 2019-08-27 LAB — BASIC METABOLIC PANEL
BUN: 13 mg/dL (ref 6–23)
CO2: 23 mEq/L (ref 19–32)
Calcium: 9.6 mg/dL (ref 8.4–10.5)
Chloride: 110 mEq/L (ref 96–112)
Creatinine, Ser: 1.01 mg/dL (ref 0.40–1.20)
GFR: 62.68 mL/min (ref >60.00)
Glucose, Bld: 90 mg/dL (ref 70–99)
Potassium: 3.6 mEq/L (ref 3.5–5.1)
Sodium: 142 mEq/L (ref 135–145)

## 2019-08-27 LAB — LIPID PANEL
Cholesterol: 176 mg/dL (ref 0–200)
HDL: 38.4 mg/dL — ABNORMAL LOW (ref >39.00)
LDL Cholesterol: 98 mg/dL (ref 0–99)
NonHDL: 137.63
Total CHOL/HDL Ratio: 5
Triglycerides: 197 mg/dL — ABNORMAL HIGH (ref 0.0–149.0)
VLDL: 39.4 mg/dL (ref 0.0–40.0)

## 2019-08-27 MED ORDER — KETOROLAC TROMETHAMINE 30 MG/ML IJ SOLN
30.0000 mg | Freq: Once | INTRAMUSCULAR | Status: AC
  Administered 2019-08-27: 30 mg via INTRAMUSCULAR

## 2019-08-27 NOTE — Patient Instructions (Signed)
Health Maintenance, Female Adopting a healthy lifestyle and getting preventive care are important in promoting health and wellness. Ask your health care provider about:  The right schedule for you to have regular tests and exams.  Things you can do on your own to prevent diseases and keep yourself healthy. What should I know about diet, weight, and exercise? Eat a healthy diet   Eat a diet that includes plenty of vegetables, fruits, low-fat dairy products, and lean protein.  Do not eat a lot of foods that are high in solid fats, added sugars, or sodium. Maintain a healthy weight Body mass index (BMI) is used to identify weight problems. It estimates body fat based on height and weight. Your health care provider can help determine your BMI and help you achieve or maintain a healthy weight. Get regular exercise Get regular exercise. This is one of the most important things you can do for your health. Most adults should:  Exercise for at least 150 minutes each week. The exercise should increase your heart rate and make you sweat (moderate-intensity exercise).  Do strengthening exercises at least twice a week. This is in addition to the moderate-intensity exercise.  Spend less time sitting. Even light physical activity can be beneficial. Watch cholesterol and blood lipids Have your blood tested for lipids and cholesterol at 34 years of age, then have this test every 5 years. Have your cholesterol levels checked more often if:  Your lipid or cholesterol levels are high.  You are older than 34 years of age.  You are at high risk for heart disease. What should I know about cancer screening? Depending on your health history and family history, you may need to have cancer screening at various ages. This may include screening for:  Breast cancer.  Cervical cancer.  Colorectal cancer.  Skin cancer.  Lung cancer. What should I know about heart disease, diabetes, and high blood  pressure? Blood pressure and heart disease  High blood pressure causes heart disease and increases the risk of stroke. This is more likely to develop in people who have high blood pressure readings, are of African descent, or are overweight.  Have your blood pressure checked: ? Every 3-5 years if you are 18-39 years of age. ? Every year if you are 40 years old or older. Diabetes Have regular diabetes screenings. This checks your fasting blood sugar level. Have the screening done:  Once every three years after age 40 if you are at a normal weight and have a low risk for diabetes.  More often and at a younger age if you are overweight or have a high risk for diabetes. What should I know about preventing infection? Hepatitis B If you have a higher risk for hepatitis B, you should be screened for this virus. Talk with your health care provider to find out if you are at risk for hepatitis B infection. Hepatitis C Testing is recommended for:  Everyone born from 1945 through 1965.  Anyone with known risk factors for hepatitis C. Sexually transmitted infections (STIs)  Get screened for STIs, including gonorrhea and chlamydia, if: ? You are sexually active and are younger than 34 years of age. ? You are older than 34 years of age and your health care provider tells you that you are at risk for this type of infection. ? Your sexual activity has changed since you were last screened, and you are at increased risk for chlamydia or gonorrhea. Ask your health care provider if   you are at risk.  Ask your health care provider about whether you are at high risk for HIV. Your health care provider may recommend a prescription medicine to help prevent HIV infection. If you choose to take medicine to prevent HIV, you should first get tested for HIV. You should then be tested every 3 months for as long as you are taking the medicine. Pregnancy  If you are about to stop having your period (premenopausal) and  you may become pregnant, seek counseling before you get pregnant.  Take 400 to 800 micrograms (mcg) of folic acid every day if you become pregnant.  Ask for birth control (contraception) if you want to prevent pregnancy. Osteoporosis and menopause Osteoporosis is a disease in which the bones lose minerals and strength with aging. This can result in bone fractures. If you are 65 years old or older, or if you are at risk for osteoporosis and fractures, ask your health care provider if you should:  Be screened for bone loss.  Take a calcium or vitamin D supplement to lower your risk of fractures.  Be given hormone replacement therapy (HRT) to treat symptoms of menopause. Follow these instructions at home: Lifestyle  Do not use any products that contain nicotine or tobacco, such as cigarettes, e-cigarettes, and chewing tobacco. If you need help quitting, ask your health care provider.  Do not use street drugs.  Do not share needles.  Ask your health care provider for help if you need support or information about quitting drugs. Alcohol use  Do not drink alcohol if: ? Your health care provider tells you not to drink. ? You are pregnant, may be pregnant, or are planning to become pregnant.  If you drink alcohol: ? Limit how much you use to 0-1 drink a day. ? Limit intake if you are breastfeeding.  Be aware of how much alcohol is in your drink. In the [place] one drink equals one 12 oz bottle of beer (355 mL), one 5 oz glass of wine (148 mL), or one 1 oz glass of hard liquor (44 mL). General instructions  Schedule regular health, dental, and eye exams.  Stay current with your vaccines.  Tell your health care provider if: ? You often feel depressed. ? You have ever been abused or do not feel safe at home. Summary  Adopting a healthy lifestyle and getting preventive care are important in promoting health and wellness.  Follow your health care provider's instructions about healthy  diet, exercising, and getting tested or screened for diseases.  Follow your health care provider's instructions on monitoring your cholesterol and blood pressure. This information is not intended to replace advice given to you by your health care provider. Make sure you discuss any questions you have with your health care provider. Document Released: 03/15/2011 Document Revised: 08/23/2018 Document Reviewed: 08/23/2018 Elsevier Patient Education  2020 [employer]  

## 2019-08-27 NOTE — Assessment & Plan Note (Signed)
Toradol 30 mg IM today Continue Topamax and Maxalt She will continue to follow with neurology

## 2019-08-27 NOTE — Assessment & Plan Note (Signed)
Continue Venlafaxine, Lamictal and Propanolol She will continue to follow with psych

## 2019-08-27 NOTE — Assessment & Plan Note (Signed)
Continue Doxepin prn

## 2019-08-27 NOTE — Assessment & Plan Note (Signed)
MRI scheduled Continue Meloxicam and Gabapentin She will continue to follow with Physical Med

## 2019-08-27 NOTE — Progress Notes (Signed)
Subjective:    Patient ID: Lindsay Nicholson, female    DOB: 04/23/1986, 33 y.o.   MRN: [id number]  HPI  Pt presents to the clinic today for her annual exam. She is also due to follow up chronic conditions.  Frequent Headaches: These occur about 3-4 times per month. She reports she has an acute headache today. She is taking Topamax and Maxalt as prescribed. She follows with neurology.  Chronic Neck/Shoulder Pain: s/p PT. She is taking Meloxicam and Gabapentin as prescribed but she does not feel like it has helped. She has a MRI of her cervical spine and left shoulder scheduled for 12/28. She follows with Physical Medicine.  Anxiety with Panic, Bipolar Depression: Persistent. Managed on Lamictal, Effexor and Propanolol. She denies SI/HI. She is currently looking for an therapist. She follows with psychiatry.  GERD: She is not sure what triggers this. She does not take any medication OTC for this. There is no upper GI on file.   Insomnia: Secondary to psychophysiologic issues. She is prescribed Doxepin but has not needed it yet  Flu: 06/2018 Tetanus: 08/2011 Pap Smear: 2019 Dentist: annually  Diet: She does eat meat. She consumes fruits and veggies daily. She does eat fried foods. She drinks mostly water and soda. Exercise: None  Review of Systems  Past Medical History:  Diagnosis Date  . Anxiety   . Cervicalgia   . SVD (spontaneous vaginal delivery) 09/14/2011    Current Outpatient Medications  Medication Sig Dispense Refill  . Aspirin-Acetaminophen-Caffeine (EXCEDRIN MIGRAINE PO) Take by mouth as needed.    . baclofen (LIORESAL) 10 MG tablet Take 1 tablet (10 mg total) by mouth 2 (two) times daily. 60 each 0  . doxepin (SINEQUAN) 10 MG capsule Take 1 capsule (10 mg total) by mouth at bedtime as needed. (Patient not taking: Reported on 08/21/2019) 30 capsule 1  . gabapentin (NEURONTIN) 300 MG capsule Take 1 capsule (300 mg total) by mouth at bedtime. 90 capsule 1  . lamoTRIgine  (LAMICTAL) 100 MG tablet Take 1 tablet (100 mg total) by mouth daily. 90 tablet 0  . medroxyPROGESTERone Acetate 150 MG/ML SUSY     . meloxicam (MOBIC) 15 MG tablet Take 1 tablet by mouth once daily 90 tablet 0  . ondansetron (ZOFRAN-ODT) 4 MG disintegrating tablet Take 1 tablet (4 mg total) by mouth every 8 (eight) hours as needed for nausea. Or for dizziness or headache and migraine. 30 tablet 3  . promethazine-dextromethorphan (PROMETHAZINE-DM) 6.25-15 MG/5ML syrup Take 5 mLs by mouth 4 (four) times daily as needed. (Patient not taking: Reported on 08/21/2019) 118 mL 0  . propranolol (INDERAL) 10 MG tablet Take 1 tablet (10 mg total) by mouth 2 (two) times daily as needed. For severe anxiety attacks only 60 tablet 1  . rizatriptan (MAXALT-MLT) 10 MG disintegrating tablet Take 1 tablet (10 mg total) by mouth as needed for migraine. May repeat in 2 hours if needed 9 tablet 11  . topiramate (TOPAMAX) 100 MG tablet Take 1 tablet by mouth once daily 30 tablet 0  . topiramate (TOPAMAX) 25 MG tablet Take 3 tablets by mouth once daily 90 tablet 0  . venlafaxine XR (EFFEXOR-XR) 150 MG 24 hr capsule Take 1 capsule (150 mg total) by mouth daily with breakfast. 90 capsule 11   No current facility-administered medications for this visit.    No Known Allergies  Family History  Problem Relation Age of Onset  . Diabetes Father   . Hypertension Father   .  High Cholesterol Father   . Anesthesia problems Neg Hx   . Cancer Neg Hx   . Heart disease Neg Hx   . Stroke Neg Hx   . Migraines Neg Hx     Social History   Socioeconomic History  . Marital status: Single    Spouse name: Not on file  . Number of children: 3  . Years of education: Not on file  . Highest education level: Associate degree: occupational, Hotel manager, or vocational program  Occupational History  . Not on file  Tobacco Use  . Smoking status: Current Every Day Smoker    Packs/day: 0.25    Types: Cigarettes  . Smokeless tobacco:  Never Used  Substance and Sexual Activity  . Alcohol use: Yes    Alcohol/week: 0.0 standard drinks    Comment: occasional  . Drug use: No  . Sexual activity: Yes    Birth control/protection: Injection  Other Topics Concern  . Not on file  Social History Narrative   Lives at home with her fiance and her children   Left handed   Caffeine: all day long   Social Determinants of Health   Financial Resource Strain:   . Difficulty of Paying Living Expenses: Not on file  Food Insecurity:   . Worried About Charity fundraiser in the Last Year: Not on file  . Ran Out of Food in the Last Year: Not on file  Transportation Needs:   . Lack of Transportation (Medical): Not on file  . Lack of Transportation (Non-Medical): Not on file  Physical Activity:   . Days of Exercise per Week: Not on file  . Minutes of Exercise per Session: Not on file  Stress:   . Feeling of Stress : Not on file  Social Connections:   . Frequency of Communication with Friends and Family: Not on file  . Frequency of Social Gatherings with Friends and Family: Not on file  . Attends Religious Services: Not on file  . Active Member of Clubs or Organizations: Not on file  . Attends Archivist Meetings: Not on file  . Marital Status: Not on file  Intimate Partner Violence:   . Fear of Current or Ex-Partner: Not on file  . Emotionally Abused: Not on file  . Physically Abused: Not on file  . Sexually Abused: Not on file     Constitutional: Pt reports headaches. Denies fever, malaise, fatigue, or abrupt weight changes.  HEENT: Denies eye pain, eye redness, ear pain, ringing in the ears, wax buildup, runny nose, nasal congestion, bloody nose, or sore throat. Respiratory: Denies difficulty breathing, shortness of breath, cough or sputum production.   Cardiovascular: Denies chest pain, chest tightness, palpitations or swelling in the hands or feet.  Gastrointestinal: Denies abdominal pain, bloating,  constipation, diarrhea or blood in the stool.  GU: Denies urgency, frequency, pain with urination, burning sensation, blood in urine, odor or discharge. Musculoskeletal: Pt reports chronic neck and shoulder pain. Denies decrease in range of motion, difficulty with gait, or joint  swelling.  Skin: Denies redness, rashes, lesions or ulcercations.  Neurological: Pt has a history of insomnia. Denies dizziness, difficulty with memory, difficulty with speech or problems with balance and coordination.  Psych: Pt has a history of anxiety and depression. Denies SI/HI.  No other specific complaints in a complete review of systems (except as listed in HPI above).     Objective:   Physical Exam  BP 118/70   Pulse (!) 110  Temp (!) 97.4 F (36.3 C) (Temporal)   Wt 123 lb (55.8 kg)   SpO2 98%   BMI 22.50 kg/m   Wt Readings from Last 3 Encounters:  08/21/19 124 lb (56.2 kg)  03/19/19 122 lb (55.3 kg)  11/20/18 120 lb 12.8 oz (54.8 kg)    General: Appears her stated age, well developed, well nourished in NAD. Skin: Warm, dry and intact. No rashes noted. HEENT: Head: normal shape and size; Neck:  Neck supple, trachea midline. No masses, lumps or thyromegaly present.  Cardiovascular: Normal rate and rhythm. S1,S2 noted.  No murmur, rubs or gallops noted. No JVD or BLE edema.  Pulmonary/Chest: Normal effort and positive vesicular breath sounds. No respiratory distress. No wheezes, rales or ronchi noted.  Abdomen: Soft and nontender. Normal bowel sounds. No distention or masses noted. Liver, spleen and kidneys non palpable. Musculoskeletal: Strength 5/5 BUE/BLE. No difficulty with gait.  Neurological: Alert and oriented. Cranial nerves II-XII grossly intact. Coordination normal.  Psychiatric: Mood and affect mildly flat. Behavior is normal. Judgment and thought content normal.     BMET    Component Value Date/Time   NA 141 02/06/2019 1602   K 3.8 02/06/2019 1602   CL 110 02/06/2019 1602    CO2 22 02/06/2019 1602   GLUCOSE 86 02/06/2019 1602   BUN 11 02/06/2019 1602   CREATININE 0.81 02/06/2019 1602   CALCIUM 9.5 02/06/2019 1602   GFRNONAA >60 11/16/2015 0832   GFRAA >60 11/16/2015 0832    Lipid Panel     Component Value Date/Time   CHOL 166 08/09/2018 1450   TRIG 254.0 (H) 08/09/2018 1450   HDL 31.80 (L) 08/09/2018 1450   CHOLHDL 5 08/09/2018 1450   VLDL 50.8 (H) 08/09/2018 1450   LDLCALC 97 08/08/2017 1501    CBC    Component Value Date/Time   WBC 7.2 02/06/2019 1602   RBC 4.22 02/06/2019 1602   HGB 13.4 02/06/2019 1602   HCT 39.3 02/06/2019 1602   PLT 269.0 02/06/2019 1602   MCV 93.2 02/06/2019 1602   MCH 30.5 11/16/2015 0832   MCHC 34.1 02/06/2019 1602   RDW 15.1 02/06/2019 1602   LYMPHSABS 0.9 11/16/2015 0832   MONOABS 0.8 11/16/2015 0832   EOSABS 0.0 11/16/2015 0832   BASOSABS 0.0 11/16/2015 0832    Hgb A1C No results found for: HGBA1C         Assessment & Plan:  Preventative Health Maintenance:  Flu shot today Tetanus UTD Pap smear UTD- request copy Encouraged her to consume a balanced diet and exercise regimen Advised him to see an eye doctor and dentist annually Will check BMET and Lipid profile today  RTC in 1 year, sooner if needed  Nicki Reaper, NP This visit occurred during the SARS-CoV-2 public health emergency.  Safety protocols were in place, including screening questions prior to the visit, additional usage of staff PPE, and extensive cleaning of exam room while observing appropriate contact time as indicated for disinfecting solutions.

## 2019-08-27 NOTE — Assessment & Plan Note (Signed)
Continue Venlafaxine, Lamictal and Propanolol She will continue to follow with psych 

## 2019-08-27 NOTE — Addendum Note (Signed)
Addended by: Lurlean Nanny on: 08/27/2019 04:46 PM   Modules accepted: Orders

## 2019-08-27 NOTE — Assessment & Plan Note (Signed)
MRI schedule Continue Meloxicam and Gabapentin Continue to follow with Physical Med

## 2019-08-29 ENCOUNTER — Encounter: Payer: Self-pay | Admitting: Internal Medicine

## 2019-09-03 ENCOUNTER — Encounter: Payer: Self-pay | Admitting: Psychiatry

## 2019-09-03 ENCOUNTER — Other Ambulatory Visit: Payer: Self-pay | Admitting: Internal Medicine

## 2019-09-03 ENCOUNTER — Ambulatory Visit (INDEPENDENT_AMBULATORY_CARE_PROVIDER_SITE_OTHER): Payer: Commercial Managed Care - PPO | Admitting: Psychiatry

## 2019-09-03 ENCOUNTER — Other Ambulatory Visit: Payer: Self-pay

## 2019-09-03 DIAGNOSIS — F424 Excoriation (skin-picking) disorder: Secondary | ICD-10-CM

## 2019-09-03 DIAGNOSIS — F3181 Bipolar II disorder: Secondary | ICD-10-CM

## 2019-09-03 DIAGNOSIS — F17200 Nicotine dependence, unspecified, uncomplicated: Secondary | ICD-10-CM | POA: Diagnosis not present

## 2019-09-03 DIAGNOSIS — F410 Panic disorder [episodic paroxysmal anxiety] without agoraphobia: Secondary | ICD-10-CM

## 2019-09-03 MED ORDER — LAMOTRIGINE 150 MG PO TABS: 150.0000 mg | ORAL_TABLET | ORAL | 0 refills | Status: DC

## 2019-09-03 MED ORDER — PROPRANOLOL HCL 20 MG PO TABS: 20.0000 mg | ORAL_TABLET | Freq: Three times a day (TID) | ORAL | 1 refills | Status: DC | PRN

## 2019-09-03 NOTE — Telephone Encounter (Signed)
Message from patient

## 2019-09-03 NOTE — Progress Notes (Signed)
Virtual Visit via Video Note  I connected with Lindsay Nicholson on 09/03/19 at  1:00 PM EST by a video enabled telemedicine application and verified that I am speaking with the correct person using two identifiers.   I discussed the limitations of evaluation and management by telemedicine and the availability of in person appointments. The patient expressed understanding and agreed to proceed.   I discussed the assessment and treatment plan with the patient. The patient was provided an opportunity to ask questions and all were answered. The patient agreed with the plan and demonstrated an understanding of the instructions.   The patient was advised to call back or seek an in-person evaluation if the symptoms worsen or if the condition fails to improve as anticipated.   BH MD OP Progress Note  09/03/2019 5:15 PM Lindsay Nicholson  MRN:  [id number]  Chief Complaint:  Chief Complaint    Follow-up     HPI: Lindsay Nicholson is a 33 year old Hispanic female, lives in [place] with boyfriend, has a history of bipolar disorder, panic attacks, skin picking disorder, tobacco use disorder was evaluated by telemedicine today.  Patient today reports she is currently struggling with left-sided shoulder joint pain.  She reports she had surgery to that shoulder joint previously however she has started having pain again.  She rates her pain at an 8 out of 10, 10 being the worst.  She is currently on gabapentin prescribed by her provider, dosage recently increased to 600 mg.  She reports in spite of that it does not seem to be helpful.  Patient reports due to the pain and her anxiety about her health and the holiday season she has been struggling with mood symptoms.  She feels anxious, nervous and down often.  She reports sleep is restless due to the pain.  However Ambien does help.  Patient has been making use of propranolol as needed which does not seem to be helping her anxiety.  Patient denies any suicidality,  homicidality or perceptual disturbances.  Patient reports she is currently on a [facility], and is hopeful to start CBT soon.  Patient denies any other concerns today. Visit Diagnosis:    ICD-10-CM   1. Bipolar 2 disorder (HCC)  F31.81 lamoTRIgine (LAMICTAL) 150 MG tablet   mixed, mild  2. Panic attacks  F41.0 propranolol (INDERAL) 20 MG tablet  3. Skin-picking disorder  F42.4   4. Tobacco use disorder  F17.200     Past Psychiatric History: I have reviewed past psychiatric history from my progress note on 03/15/2019.  Past trials of Wellbutrin, BuSpar, hydroxyzine, Effexor  Past Medical History:  Past Medical History:  Diagnosis Date  . Anxiety   . Cervicalgia   . SVD (spontaneous vaginal delivery) 09/14/2011    Past Surgical History:  Procedure Laterality Date  . LAPAROSCOPIC APPENDECTOMY N/A 11/16/2015   Procedure: APPENDECTOMY LAPAROSCOPIC;  Surgeon: Ovidio Kin, MD;  Location: [facility];  Service: General;  Laterality: N/A;  . SHOULDER FUSION SURGERY      Family Psychiatric History: Reviewed family psychiatric history from my progress note on 03/15/2019  Family History:  Family History  Problem Relation Age of Onset  . Diabetes Father   . Hypertension Father   . High Cholesterol Father   . Anesthesia problems Neg Hx   . Cancer Neg Hx   . Heart disease Neg Hx   . Stroke Neg Hx   . Migraines Neg Hx     Social History: Reviewed  social history from my progress note on 03/15/2019 Social History   Socioeconomic History  . Marital status: Single    Spouse name: Not on file  . Number of children: 3  . Years of education: Not on file  . Highest education level: Associate degree: occupational, Scientist, product/process developmenttechnical, or vocational program  Occupational History  . Not on file  Tobacco Use  . Smoking status: Current Every Day Smoker    Packs/day: 0.25    Types: Cigarettes  . Smokeless tobacco: Never Used  Substance and Sexual Activity  .  Alcohol use: Yes    Alcohol/week: 0.0 standard drinks    Comment: occasional  . Drug use: No  . Sexual activity: Yes    Birth control/protection: Injection  Other Topics Concern  . Not on file  Social History Narrative   Lives at home with her fiance and her children   Left handed   Caffeine: all day long   Social Determinants of Health   Financial Resource Strain:   . Difficulty of Paying Living Expenses: Not on file  Food Insecurity:   . Worried About Programme researcher, broadcasting/film/videounning Out of Food in the Last Year: Not on file  . Ran Out of Food in the Last Year: Not on file  Transportation Needs:   . Lack of Transportation (Medical): Not on file  . Lack of Transportation (Non-Medical): Not on file  Physical Activity:   . Days of Exercise per Week: Not on file  . Minutes of Exercise per Session: Not on file  Stress:   . Feeling of Stress : Not on file  Social Connections:   . Frequency of Communication with Friends and Family: Not on file  . Frequency of Social Gatherings with Friends and Family: Not on file  . Attends Religious Services: Not on file  . Active Member of Clubs or Organizations: Not on file  . Attends BankerClub or Organization Meetings: Not on file  . Marital Status: Not on file    Allergies: No Known Allergies  Metabolic Disorder Labs: No results found for: HGBA1C, MPG No results found for: PROLACTIN Lab Results  Component Value Date   CHOL 176 08/27/2019   TRIG 197.0 (H) 08/27/2019   HDL 38.40 (L) 08/27/2019   CHOLHDL 5 08/27/2019   VLDL 39.4 08/27/2019   LDLCALC 98 08/27/2019   LDLCALC 97 08/08/2017   Lab Results  Component Value Date   TSH 0.93 02/06/2019   TSH 1.02 12/03/2014    Therapeutic Level Labs: No results found for: LITHIUM No results found for: VALPROATE No components found for:  CBMZ  Current Medications: Current Outpatient Medications  Medication Sig Dispense Refill  . Aspirin-Acetaminophen-Caffeine (EXCEDRIN MIGRAINE PO) Take by mouth as needed.     . lamoTRIgine (LAMICTAL) 150 MG tablet Take 1 tablet (150 mg total) by mouth as directed. TAKE HALF TABLET TWICE A DAY 90 tablet 0  . medroxyPROGESTERone Acetate 150 MG/ML SUSY     . ondansetron (ZOFRAN-ODT) 4 MG disintegrating tablet Take 1 tablet (4 mg total) by mouth every 8 (eight) hours as needed for nausea. Or for dizziness or headache and migraine. 30 tablet 3  . promethazine-dextromethorphan (PROMETHAZINE-DM) 6.25-15 MG/5ML syrup Take 5 mLs by mouth 4 (four) times daily as needed. 118 mL 0  . propranolol (INDERAL) 20 MG tablet Take 1 tablet (20 mg total) by mouth 3 (three) times daily as needed. For severe anxiety attacks 90 tablet 1  . rizatriptan (MAXALT-MLT) 10 MG disintegrating tablet Take 1 tablet (10 mg  total) by mouth as needed for migraine. May repeat in 2 hours if needed 9 tablet 11  . topiramate (TOPAMAX) 100 MG tablet Take 1 tablet by mouth once daily 30 tablet 0  . topiramate (TOPAMAX) 25 MG tablet Take 3 tablets by mouth once daily 90 tablet 0  . venlafaxine XR (EFFEXOR-XR) 150 MG 24 hr capsule Take 1 capsule (150 mg total) by mouth daily with breakfast. 90 capsule 11  . zolpidem (AMBIEN) 10 MG tablet zolpidem 10 mg tablet  TAKE 1 TABLET BY MOUTH AT BEDTIME AS NEEDED FOR SLEEP    . gabapentin (NEURONTIN) 300 MG capsule Take 1 capsule (300 mg total) by mouth at bedtime. 90 capsule 1  . meloxicam (MOBIC) 15 MG tablet Take 1 tablet by mouth once daily (Patient not taking: Reported on 09/03/2019) 90 tablet 0   No current facility-administered medications for this visit.     Musculoskeletal: Strength & Muscle Tone: UTA Gait & Station: normal Patient leans: N/A  Psychiatric Specialty Exam: Review of Systems  Musculoskeletal:       Left shoulder joint pain  Psychiatric/Behavioral: Positive for dysphoric mood and sleep disturbance. The patient is nervous/anxious.   All other systems reviewed and are negative.   There were no vitals taken for this visit.There is no height  or weight on file to calculate BMI.  General Appearance: Casual  Eye Contact:  Good  Speech:  Clear and Coherent  Volume:  Normal  Mood:  Anxious and Depressed  Affect:  Appropriate  Thought Process:  Goal Directed and Descriptions of Associations: Intact  Orientation:  Full (Time, Place, and Person)  Thought Content: Logical   Suicidal Thoughts:  No  Homicidal Thoughts:  No  Memory:  Immediate;   Fair Recent;   Fair Remote;   Fair  Judgement:  Fair  Insight:  Fair  Psychomotor Activity:  Normal  Concentration:  Concentration: Fair and Attention Span: Fair  Recall:  [employer] of Knowledge: Fair  Language: Fair  Akathisia:  No  Handed:  Left  AIMS (if indicated): denies tremors, rigidity  Assets:  Communication Skills Desire for Improvement Housing Social Support  ADL's:  Intact  Cognition: WNL  Sleep:  Restless due to pain   Screenings: PHQ2-9     Office Visit from 08/27/2019 in [place] at [facility] Visit from 08/21/2019 in [place] Visit from 08/09/2018 in [place] at [facility] Visit from 03/02/2018 in [place] at [facility] Visit from 08/08/2017 in [place] at [facility] Total Score  1  2  0  0  0  PHQ-9 Total Score  8  7  0  --  --       Assessment and Plan: Jalaya is a 33 year old Hispanic female, employed, lives in [place], has a history of bipolar disorder, skin picking disorder, panic attack was evaluated by telemedicine today.  Patient is biologically predisposed given her history of trauma.  She also has psychosocial stressors of the current pandemic.  Patient continues to struggle with irritability as well as anxiety although it is likely exacerbated by her pain.  Patient will benefit from medication readjustment as well as psychotherapy sessions.  Plan Bipolar disorder type II-improving Increase Lamictal to 150 mg p.o. daily in  divided dosage. Venlafaxine as prescribed by neurology for headaches. Ambien 10 mg p.o. nightly as needed for sleep Patient was referred for CBT-she reports she is currently on a  [facility].  Panic attacks-improving Increase propranolol to 20 mg p.o. 3 times daily as needed Referred for CBT-pending  Skin picking disorder-improving Referred for CBT  Tobacco use disorder-unstable Provided information for [facility] quit now program Provided smoking cessation counseling.  Patient advised to reach out to her pain provider for medication management of her pain.  Follow-up in clinic in 2 weeks or sooner if needed.  January 12 at 9:20 AM  I have spent atleast 15 minutes non  face to face with patient today. More than 50 % of the time was spent for psychoeducation and supportive psychotherapy and care coordination. This note was generated in part or whole with voice recognition software. Voice recognition is usually quite accurate but there are transcription errors that can and very often do occur. I apologize for any typographical errors that were not detected and corrected.        Jomarie Longs, MD 09/03/2019, 5:15 PM

## 2019-09-06 ENCOUNTER — Telehealth: Payer: Self-pay | Admitting: Physical Medicine and Rehabilitation

## 2019-09-06 NOTE — Telephone Encounter (Signed)
Patient is having severe shoulder pain and Gabapentin is not helping. She has not tolerated Mobic in the past. I offered oral steroids or corticosteroid injection as pain relief options while we await her MRI (scheduled 12/28) results. She prefers corticosteroid injection. Risks and benefits discussed and patient agrees to proceed with procedure.

## 2019-09-10 ENCOUNTER — Ambulatory Visit (HOSPITAL_COMMUNITY)
Admission: RE | Admit: 2019-09-10 | Discharge: 2019-09-10 | Disposition: A | Discharge status: Home / Self Care | Payer: Commercial Managed Care - PPO | Source: Ambulatory Visit | Attending: Physical Medicine and Rehabilitation | Admitting: Physical Medicine and Rehabilitation

## 2019-09-10 ENCOUNTER — Other Ambulatory Visit: Payer: Self-pay

## 2019-09-10 DIAGNOSIS — M25512 Pain in left shoulder: Secondary | ICD-10-CM | POA: Insufficient documentation

## 2019-09-10 DIAGNOSIS — G8929 Other chronic pain: Secondary | ICD-10-CM | POA: Diagnosis present

## 2019-09-17 ENCOUNTER — Telehealth: Payer: Self-pay

## 2019-09-17 DIAGNOSIS — F3181 Bipolar II disorder: Secondary | ICD-10-CM

## 2019-09-17 MED ORDER — LAMOTRIGINE 150 MG PO TABS: 150.0000 mg | ORAL_TABLET | ORAL | 0 refills | Status: DC

## 2019-09-17 NOTE — Telephone Encounter (Signed)
SENT Lamictal to pharmacy again

## 2019-09-17 NOTE — Telephone Encounter (Signed)
  pharmacy called left message that they need rx to have correct instruction on the lamictal    Disp Refills Start End   lamoTRIgine (LAMICTAL) 150 MG tablet 90 tablet 0 09/03/2019    Sig - Route: Take 1 tablet (150 mg total) by mouth as directed. TAKE HALF TABLET TWICE A DAY - Oral   Sent to pharmacy as: lamoTRIgine (LAMICTAL) 150 MG tablet   E-Prescribing Status: Receipt confirmed by pharmacy (09/03/2019 1:15 PM EST

## 2019-09-18 ENCOUNTER — Other Ambulatory Visit: Payer: Self-pay

## 2019-09-18 ENCOUNTER — Encounter: Payer: Self-pay | Admitting: Physical Medicine and Rehabilitation

## 2019-09-18 ENCOUNTER — Encounter
Payer: Commercial Managed Care - PPO | Attending: Physical Medicine & Rehabilitation | Admitting: Physical Medicine and Rehabilitation

## 2019-09-18 VITALS — BP 121/78 | HR 95 | Temp 97.7°F | Ht 61.0 in | Wt 127.0 lb

## 2019-09-18 DIAGNOSIS — M7592 Shoulder lesion, unspecified, left shoulder: Secondary | ICD-10-CM | POA: Diagnosis not present

## 2019-09-18 DIAGNOSIS — M25512 Pain in left shoulder: Secondary | ICD-10-CM

## 2019-09-18 DIAGNOSIS — G8929 Other chronic pain: Secondary | ICD-10-CM | POA: Diagnosis present

## 2019-09-18 DIAGNOSIS — M7918 Myalgia, other site: Secondary | ICD-10-CM | POA: Diagnosis present

## 2019-09-18 NOTE — Progress Notes (Signed)
Shoulder injection, left  Indication:Left Shoulder pain not relieved by medication management and other conservative care.  Informed consent was obtained after describing risks and benefits of the procedure with the patient, this includes bleeding, bruising, infection and medication side effects. The patient wishes to proceed and has given written consent. Patient was placed in a seated position. The left shoulder was marked and prepped with betadine in the subacromial area. A 25-gauge 1-1/2 inch needle was inserted into the subacromial area. After negative draw back for blood, a solution containing 1 mL of 6 mg per ML betamethasone and 4 mL of 1% lidocaine was injected. A band aid was applied. The patient tolerated the procedure well. Post procedure instructions were given.

## 2019-09-19 ENCOUNTER — Encounter: Payer: Self-pay | Admitting: Family Medicine

## 2019-09-19 ENCOUNTER — Ambulatory Visit: Payer: Commercial Managed Care - PPO | Admitting: Family Medicine

## 2019-09-19 MED ORDER — LAMOTRIGINE 150 MG PO TABS: 150.0000 mg | ORAL_TABLET | Freq: Every day | ORAL | 0 refills | Status: DC

## 2019-09-19 NOTE — Telephone Encounter (Signed)
pharmacy needs a new rx with better instructions needs only one instruction please    lamoTRIgine (LAMICTAL) 150 MG tablet Medication Date: 09/17/2019 Department: [facility] Ordering/Authorizing: Jomarie Longs, MD  Order Providers  Prescribing Provider Encounter Provider  Jomarie Longs, MD Elvina Mattes, CMA  Outpatient Medication Detail   Disp Refills Start End   lamoTRIgine (LAMICTAL) 150 MG tablet 90 tablet 0 09/17/2019    Sig - Route: Take 1 tablet (150 mg total) by mouth as directed. TAKE HALF TABLET OR 75 MG BY MOUTH TWICE A DAY - Oral   Sent to pharmacy as: lamoTRIgine (LAMICTAL) 150 MG tablet   E-Prescribing Status: Receipt confirmed by pharmacy (09/17/2019 12:52 PM EST)

## 2019-09-19 NOTE — Telephone Encounter (Signed)
Sent new script for lamictal

## 2019-09-19 NOTE — Telephone Encounter (Signed)
Question from patient 

## 2019-09-21 ENCOUNTER — Other Ambulatory Visit: Payer: Self-pay | Admitting: Physical Medicine and Rehabilitation

## 2019-09-21 DIAGNOSIS — M25512 Pain in left shoulder: Secondary | ICD-10-CM

## 2019-09-21 DIAGNOSIS — G8929 Other chronic pain: Secondary | ICD-10-CM

## 2019-09-21 MED ORDER — PREGABALIN 25 MG PO CAPS: 25.0000 mg | ORAL_CAPSULE | Freq: Three times a day (TID) | ORAL | 2 refills | Status: DC

## 2019-09-25 ENCOUNTER — Encounter: Payer: Self-pay | Admitting: Psychiatry

## 2019-09-25 ENCOUNTER — Other Ambulatory Visit: Payer: Self-pay

## 2019-09-25 ENCOUNTER — Ambulatory Visit (INDEPENDENT_AMBULATORY_CARE_PROVIDER_SITE_OTHER): Payer: Commercial Managed Care - PPO | Admitting: Psychiatry

## 2019-09-25 DIAGNOSIS — F5105 Insomnia due to other mental disorder: Secondary | ICD-10-CM

## 2019-09-25 DIAGNOSIS — F3161 Bipolar disorder, current episode mixed, mild: Secondary | ICD-10-CM | POA: Diagnosis not present

## 2019-09-25 DIAGNOSIS — F424 Excoriation (skin-picking) disorder: Secondary | ICD-10-CM | POA: Diagnosis not present

## 2019-09-25 DIAGNOSIS — F17200 Nicotine dependence, unspecified, uncomplicated: Secondary | ICD-10-CM

## 2019-09-25 DIAGNOSIS — F410 Panic disorder [episodic paroxysmal anxiety] without agoraphobia: Secondary | ICD-10-CM | POA: Diagnosis not present

## 2019-09-25 MED ORDER — DOXEPIN HCL 10 MG PO CAPS: 10.0000 mg | ORAL_CAPSULE | Freq: Every evening | ORAL | 0 refills | Status: DC | PRN

## 2019-09-25 NOTE — Progress Notes (Signed)
This note is not being shared with the patient for the following reason: To prevent harm (release of this note would result in harm to the life or physical safety of the patient or another).  Patient reports recent suicidality  Virtual Visit via Video Note  I connected with Lindsay Nicholson on 09/25/19 at  9:20 AM EST by a video enabled telemedicine application and verified that I am speaking with the correct person using two identifiers.   I discussed the limitations of evaluation and management by telemedicine and the availability of in person appointments. The patient expressed understanding and agreed to proceed.     I discussed the assessment and treatment plan with the patient. The patient was provided an opportunity to ask questions and all were answered. The patient agreed with the plan and demonstrated an understanding of the instructions.   The patient was advised to call back or seek an in-person evaluation if the symptoms worsen or if the condition fails to improve as anticipated.   [facility] MD OP Progress Note  09/25/2019 12:44 PM Shanai Lartigue  MRN:  [id number]  Chief Complaint:  Chief Complaint    Follow-up     HPI: Lindsay Nicholson is a 34 year old Hispanic female, lives in [place] with boyfriend, has a history of bipolar disorder, panic attacks, skin picking disorder, tobacco use disorder was evaluated by telemedicine today.  Patient today reports she is currently struggling with depressive symptoms.  She reports she struggles with sadness, low energy, sleep problems as well as suicidality.  She reports she has relationship struggles with her brother.  She reports she spoke to him few days ago and felt suicidal when she spoke to him.  She reports she felt like she just wanted to go away.  She however denies any active plan.  She currently denies any suicidal thoughts.  Patient however reports she does have this negative thoughts on and off.  She reports sleep continues to be restless.  She  goes to bed around 4 AM and wakes up around 9 AM.  She reports she took the Ambien only a few times and it did not help.  Patient reports visual hallucinations of seeing shadows.  She reports she sees the shadows every day.  It does not distress her often.  Patient reports she has more control over her skin picking and currently it does not bother her much.  She has been unable to get in touch with her therapist even though she had told writer she is on the [facility] for [facility].  Patient denies any other concerns today. Visit Diagnosis:    ICD-10-CM   1. Bipolar 1 disorder, mixed, mild ([facility])  F31.61    Mixed,mild  2. Panic attacks  F41.0   3. Insomnia due to mental disorder  F51.05 doxepin (SINEQUAN) 10 MG capsule  4. Skin-picking disorder  F42.4   5. Tobacco use disorder  F17.200     Past Psychiatric History: I have reviewed past psychiatric history from my progress note on 03/15/2019.  Past trials of Wellbutrin, BuSpar, hydroxyzine, Effexor  Past Medical History:  Past Medical History:  Diagnosis Date  . Anxiety   . Cervicalgia   . SVD (spontaneous vaginal delivery) 09/14/2011    Past Surgical History:  Procedure Laterality Date  . LAPAROSCOPIC APPENDECTOMY N/A 11/16/2015   Procedure: APPENDECTOMY LAPAROSCOPIC;  Surgeon: Alphonsa Overall, MD;  Location: [facility];  Service: General;  Laterality: N/A;  . [place]  History: I have reviewed family psychiatric history from my progress note on 03/15/2019.  Family History:  Family History  Problem Relation Age of Onset  . Diabetes Father   . Hypertension Father   . High Cholesterol Father   . Anesthesia problems Neg Hx   . Cancer Neg Hx   . Heart disease Neg Hx   . Stroke Neg Hx   . Migraines Neg Hx     Social History: Reviewed social history from my progress note on 03/15/2019. Social History   Socioeconomic History  . Marital status: Single    Spouse name: Not on file  . Number of  children: 3  . Years of education: Not on file  . Highest education level: Associate degree: occupational, Scientist, product/process development, or vocational program  Occupational History  . Not on file  Tobacco Use  . Smoking status: Current Every Day Smoker    Packs/day: 0.25    Types: Cigarettes  . Smokeless tobacco: Never Used  Substance and Sexual Activity  . Alcohol use: Yes    Alcohol/week: 0.0 standard drinks    Comment: occasional  . Drug use: No  . Sexual activity: Yes    Birth control/protection: Injection  Other Topics Concern  . Not on file  Social History Narrative   Lives at home with her fiance and her children   Left handed   Caffeine: all day long   Social Determinants of Health   Financial Resource Strain:   . Difficulty of Paying Living Expenses: Not on file  Food Insecurity:   . Worried About Programme researcher, broadcasting/film/video in the Last Year: Not on file  . Ran Out of Food in the Last Year: Not on file  Transportation Needs:   . Lack of Transportation (Medical): Not on file  . Lack of Transportation (Non-Medical): Not on file  Physical Activity:   . Days of Exercise per Week: Not on file  . Minutes of Exercise per Session: Not on file  Stress:   . Feeling of Stress : Not on file  Social Connections:   . Frequency of Communication with Friends and Family: Not on file  . Frequency of Social Gatherings with Friends and Family: Not on file  . Attends Religious Services: Not on file  . Active Member of Clubs or Organizations: Not on file  . Attends Banker Meetings: Not on file  . Marital Status: Not on file    Allergies: No Known Allergies  Metabolic Disorder Labs: No results found for: HGBA1C, MPG No results found for: PROLACTIN Lab Results  Component Value Date   CHOL 176 08/27/2019   TRIG 197.0 (H) 08/27/2019   HDL 38.40 (L) 08/27/2019   CHOLHDL 5 08/27/2019   VLDL 39.4 08/27/2019   LDLCALC 98 08/27/2019   LDLCALC 97 08/08/2017   Lab Results  Component Value  Date   TSH 0.93 02/06/2019   TSH 1.02 12/03/2014    Therapeutic Level Labs: No results found for: LITHIUM No results found for: VALPROATE No components found for:  CBMZ  Current Medications: Current Outpatient Medications  Medication Sig Dispense Refill  . Aspirin-Acetaminophen-Caffeine (EXCEDRIN MIGRAINE PO) Take by mouth as needed.    . doxepin (SINEQUAN) 10 MG capsule Take 1-2 capsules (10-20 mg total) by mouth at bedtime as needed. For sleep 60 capsule 0  . gabapentin (NEURONTIN) 300 MG capsule Take 1 capsule (300 mg total) by mouth at bedtime. 90 capsule 1  . lamoTRIgine (LAMICTAL) 150 MG tablet Take 1  tablet (150 mg total) by mouth daily. 90 tablet 0  . medroxyPROGESTERone Acetate 150 MG/ML SUSY     . meloxicam (MOBIC) 15 MG tablet Take 1 tablet by mouth once daily 90 tablet 0  . ondansetron (ZOFRAN-ODT) 4 MG disintegrating tablet Take 1 tablet (4 mg total) by mouth every 8 (eight) hours as needed for nausea. Or for dizziness or headache and migraine. 30 tablet 3  . pregabalin (LYRICA) 25 MG capsule Take 1 capsule (25 mg total) by mouth 3 (three) times daily. 90 capsule 2  . promethazine-dextromethorphan (PROMETHAZINE-DM) 6.25-15 MG/5ML syrup Take 5 mLs by mouth 4 (four) times daily as needed. 118 mL 0  . propranolol (INDERAL) 20 MG tablet Take 1 tablet (20 mg total) by mouth 3 (three) times daily as needed. For severe anxiety attacks 90 tablet 1  . rizatriptan (MAXALT-MLT) 10 MG disintegrating tablet Take 1 tablet (10 mg total) by mouth as needed for migraine. May repeat in 2 hours if needed 9 tablet 11  . topiramate (TOPAMAX) 100 MG tablet Take 1 tablet by mouth once daily 30 tablet 2  . venlafaxine XR (EFFEXOR-XR) 150 MG 24 hr capsule Take 1 capsule (150 mg total) by mouth daily with breakfast. 90 capsule 11   No current facility-administered medications for this visit.     Musculoskeletal: Strength & Muscle Tone: UTA Gait & Station: normal Patient leans: N/A  Psychiatric  Specialty Exam: Review of Systems  Psychiatric/Behavioral: Positive for dysphoric mood, hallucinations and sleep disturbance. The patient is nervous/anxious.   All other systems reviewed and are negative.   There were no vitals taken for this visit.There is no height or weight on file to calculate BMI.  General Appearance: Casual  Eye Contact:  Fair  Speech:  Clear and Coherent  Volume:  Normal  Mood:  Anxious and Depressed  Affect:  Congruent  Thought Process:  Goal Directed and Descriptions of Associations: Intact  Orientation:  Full (Time, Place, and Person)  Thought Content: Hallucinations: Visual Report seeing shadows on and off   Suicidal Thoughts:  No  Homicidal Thoughts:  No  Memory:  Immediate;   Fair Recent;   Fair Remote;   Fair  Judgement:  Fair  Insight:  Fair  Psychomotor Activity:  Normal  Concentration:  Concentration: Fair and Attention Span: Fair  Recall:  [employer] of Knowledge: Fair  Language: Fair  Akathisia:  No  Handed:  Left  AIMS (if indicated): UTA  Assets:  Communication Skills Desire for Improvement Housing Social Support Talents/Skills Transportation  ADL's:  Intact  Cognition: WNL  Sleep:  Poor   Screenings: PHQ2-9     Office Visit from 08/27/2019 in [facility] Visit from 08/21/2019 in [facility] Visit from 08/09/2018 in [facility] Visit from 03/02/2018 in [facility] Visit from 08/08/2017 in [facility] Total Score  1  2  0  0  0  PHQ-9 Total Score  8  7  0  --  --       Assessment and Plan: Linley is a 34 year old Hispanic female, employed, lives in [place], has a history of bipolar disorder, skin picking disorder, panic attacks was evaluated by telemedicine today.  She is biologically predisposed given her history of trauma.  She has psychosocial stressors of the  current pandemic and her own relationship struggles.  Patient continues to struggle with mood  symptoms as well as sleep problems and has been noncompliant with psychotherapy sessions.  Risk factors for suicide-recent suicidal thoughts, has mental health problems, currently not stable on medications, noncompliance with psychotherapy referral. Protective factors are she currently denies any active thoughts or plan of suicide, denies any past attempts of suicide, denies family history of suicide, reports she is willing to be more compliant on medications as well as pursue psychotherapy sessions, agrees to get help if she has suicidal thoughts, she has young children she is responsible for. Acute risk for suicide hence is low.     Plan as noted below.   Plan Bipolar disorder type -unstable Lamotrigine 150 mg p.o. daily divided dosage Venlafaxine as prescribed.  I have communicated with her neurologist who prescribed her the venlafaxine for headaches.  Discussed readjusting the dosage in the future if she continues to be depressed.  Her neurologist Dr.Ahern is okay with it.  Discussed the same with patient.   Panic attacks-some progress Propanolol 20 mg p.o. 3 times daily as needed Referred for CBT-I have sent message to her coordinator Mrs.Manson Passey who will refer her to a therapist within our system.  Skin picking disorder-improving Referred for CBT  Insomnia-unstable Discussed sleep hygiene techniques. Start doxepin 10 to 20 mg p.o. nightly as needed Discussed with patient the interaction between doxepin and venlafaxine.  Tobacco use disorder-improving Provided smoking cessation counseling.  Patient may benefit from an antipsychotic medication however will reevaluate her in a week.  Follow-up in clinic in 1 week or sooner if needed.  January 22 at 11:20 AM  I have spent atleast 30 minutes non face to face with patient today. More than 50 % of the time was spent for  obtaining and to  review and separately obtained history , ordering medications and test ,psychoeducation and supportive psychotherapy and care coordination,as well as documenting clinical information in electronic health record. This note was generated in part or whole with voice recognition software. Voice recognition is usually quite accurate but there are transcription errors that can and very often do occur. I apologize for any typographical errors that were not detected and corrected.       Jomarie Longs, MD 09/25/2019, 12:44 PM

## 2019-10-01 ENCOUNTER — Ambulatory Visit (HOSPITAL_COMMUNITY): Payer: Commercial Managed Care - PPO | Admitting: Clinical

## 2019-10-01 ENCOUNTER — Telehealth (HOSPITAL_COMMUNITY): Payer: Self-pay | Admitting: Clinical

## 2019-10-01 ENCOUNTER — Other Ambulatory Visit: Payer: Self-pay

## 2019-10-01 NOTE — Telephone Encounter (Signed)
The OPT therapist attempted text to session x2 @ 10:00AM and 10:10Am, however, the patient did not respond and missed her scheduled session.

## 2019-10-02 ENCOUNTER — Ambulatory Visit (INDEPENDENT_AMBULATORY_CARE_PROVIDER_SITE_OTHER): Payer: Commercial Managed Care - PPO | Admitting: Clinical

## 2019-10-02 ENCOUNTER — Other Ambulatory Visit: Payer: Self-pay

## 2019-10-02 DIAGNOSIS — F5105 Insomnia due to other mental disorder: Secondary | ICD-10-CM

## 2019-10-02 DIAGNOSIS — F3161 Bipolar disorder, current episode mixed, mild: Secondary | ICD-10-CM

## 2019-10-02 DIAGNOSIS — F410 Panic disorder [episodic paroxysmal anxiety] without agoraphobia: Secondary | ICD-10-CM | POA: Diagnosis not present
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
## 2019-10-03 ENCOUNTER — Encounter: Payer: Self-pay | Admitting: Physical Medicine and Rehabilitation

## 2019-10-03 ENCOUNTER — Other Ambulatory Visit: Payer: Self-pay

## 2019-10-03 ENCOUNTER — Encounter (HOSPITAL_BASED_OUTPATIENT_CLINIC_OR_DEPARTMENT_OTHER): Payer: Commercial Managed Care - PPO | Admitting: Physical Medicine and Rehabilitation

## 2019-10-03 VITALS — BP 115/80 | HR 92 | Temp 97.7°F | Ht 61.0 in | Wt 129.0 lb

## 2019-10-03 DIAGNOSIS — R519 Headache, unspecified: Secondary | ICD-10-CM

## 2019-10-03 DIAGNOSIS — G8929 Other chronic pain: Secondary | ICD-10-CM | POA: Diagnosis not present

## 2019-10-03 DIAGNOSIS — M25512 Pain in left shoulder: Secondary | ICD-10-CM | POA: Diagnosis not present

## 2019-10-03 DIAGNOSIS — M7918 Myalgia, other site: Secondary | ICD-10-CM

## 2019-10-03 MED ORDER — PREGABALIN 50 MG PO CAPS: 50.0000 mg | ORAL_CAPSULE | Freq: Three times a day (TID) | ORAL | 1 refills | Status: DC
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
## 2019-10-05 ENCOUNTER — Other Ambulatory Visit: Payer: Self-pay

## 2019-10-05 ENCOUNTER — Encounter: Payer: Self-pay | Admitting: Psychiatry

## 2019-10-05 ENCOUNTER — Ambulatory Visit (INDEPENDENT_AMBULATORY_CARE_PROVIDER_SITE_OTHER): Payer: Commercial Managed Care - PPO | Admitting: Psychiatry

## 2019-10-05 DIAGNOSIS — F5105 Insomnia due to other mental disorder: Secondary | ICD-10-CM

## 2019-10-05 DIAGNOSIS — F424 Excoriation (skin-picking) disorder: Secondary | ICD-10-CM | POA: Diagnosis not present

## 2019-10-05 DIAGNOSIS — F3161 Bipolar disorder, current episode mixed, mild: Secondary | ICD-10-CM

## 2019-10-05 DIAGNOSIS — F410 Panic disorder [episodic paroxysmal anxiety] without agoraphobia: Secondary | ICD-10-CM | POA: Diagnosis not present

## 2019-10-05 DIAGNOSIS — F17200 Nicotine dependence, unspecified, uncomplicated: Secondary | ICD-10-CM
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
## 2019-10-16 ENCOUNTER — Telehealth: Payer: Self-pay | Admitting: Neurology

## 2019-10-16 NOTE — Telephone Encounter (Signed)
Ladona Ridgel, would you call and get her a follow up with Amy or Megan for her headaches? Thanks!

## 2019-10-17 NOTE — Telephone Encounter (Signed)
I called patient and LVM regarding scheduling her a follow-up with Amy or Megan for first available.

## 2019-10-23 ENCOUNTER — Ambulatory Visit (INDEPENDENT_AMBULATORY_CARE_PROVIDER_SITE_OTHER): Payer: Commercial Managed Care - PPO | Admitting: Psychiatry

## 2019-10-23 ENCOUNTER — Encounter: Payer: Self-pay | Admitting: Psychiatry

## 2019-10-23 ENCOUNTER — Other Ambulatory Visit: Payer: Self-pay

## 2019-10-23 DIAGNOSIS — F5105 Insomnia due to other mental disorder: Secondary | ICD-10-CM

## 2019-10-23 DIAGNOSIS — F3161 Bipolar disorder, current episode mixed, mild: Secondary | ICD-10-CM | POA: Diagnosis not present

## 2019-10-23 DIAGNOSIS — F424 Excoriation (skin-picking) disorder: Secondary | ICD-10-CM | POA: Diagnosis not present

## 2019-10-23 DIAGNOSIS — F17200 Nicotine dependence, unspecified, uncomplicated: Secondary | ICD-10-CM

## 2019-10-23 DIAGNOSIS — F410 Panic disorder [episodic paroxysmal anxiety] without agoraphobia: Secondary | ICD-10-CM | POA: Diagnosis not present

## 2019-10-23 MED ORDER — ROPINIROLE HCL 0.25 MG PO TABS: 0.2500 mg | ORAL_TABLET | Freq: Every day | ORAL | 1 refills | Status: DC

## 2019-10-23 NOTE — Progress Notes (Signed)
Provider Location : [facility] Patient Location : Home  Virtual Visit via Video Note  I connected with Lindsay Nicholson on 10/23/19 at  2:40 PM EST by a video enabled telemedicine application and verified that I am speaking with the correct person using two identifiers.   I discussed the limitations of evaluation and management by telemedicine and the availability of in person appointments. The patient expressed understanding and agreed to proceed.     I discussed the assessment and treatment plan with the patient. The patient was provided an opportunity to ask questions and all were answered. The patient agreed with the plan and demonstrated an understanding of the instructions.   The patient was advised to call back or seek an in-person evaluation if the symptoms worsen or if the condition fails to improve as anticipated.   [facility] MD OP Progress Note  10/23/2019 4:54 PM Whittany Parish  MRN:  [id number]  Chief Complaint:  Chief Complaint    Follow-up     HPI: Lindsay Nicholson is a 34 year old Hispanic female, lives in [place], has a history of bipolar disorder, panic attacks, skin picking disorder, tobacco use disorder was evaluated by telemedicine today.  Patient reports she is currently struggling with headaches.  She has not noticed much benefit with regards to her headaches since her last visit with Clinical research associate.  She has reached out to her neurologist and was advised to schedule an appointment for follow-up.  She reports that is still pending.  She reports sleep is currently restless and she has a lot of restless legs at night.  Patient continues to struggle with depressive symptoms, feels irritable and also has problems with her concentration and focus at work.  Patient reports she is currently working with her therapist and reports therapy sessions is beneficial.  She currently denies any suicidality, homicidality or perceptual disturbances. Visit Diagnosis:    ICD-10-CM   1. Bipolar 1 disorder, mixed,  mild (HCC)  F31.61   2. Panic attacks  F41.0   3. Insomnia due to mental disorder  F51.05 rOPINIRole (REQUIP) 0.25 MG tablet  4. Skin-picking disorder  F42.4   5. Tobacco use disorder  F17.200     Past Psychiatric History: I have reviewed past psychiatric history from my progress note on 03/15/2019.  Past trials of Wellbutrin, BuSpar, hydroxyzine, Effexor  Past Medical History:  Past Medical History:  Diagnosis Date  . Anxiety   . Cervicalgia   . SVD (spontaneous vaginal delivery) 09/14/2011    Past Surgical History:  Procedure Laterality Date  . LAPAROSCOPIC APPENDECTOMY N/A 11/16/2015   Procedure: APPENDECTOMY LAPAROSCOPIC;  Surgeon: Ovidio Kin, MD;  Location: [facility];  Service: General;  Laterality: N/A;  . SHOULDER FUSION SURGERY      Family Psychiatric History: I have reviewed family psychiatric history from my progress note on 03/15/2019.  Family History:  Family History  Problem Relation Age of Onset  . Diabetes Father   . Hypertension Father   . High Cholesterol Father   . Anesthesia problems Neg Hx   . Cancer Neg Hx   . Heart disease Neg Hx   . Stroke Neg Hx   . Migraines Neg Hx     Social History: I have reviewed social history from my progress note on 03/15/2019. Social History   Socioeconomic History  . Marital status: Single    Spouse name: Not on file  . Number of children: 3  . Years of education: Not on file  . Highest education level: Associate degree: occupational,  technical, or vocational program  Occupational History  . Not on file  Tobacco Use  . Smoking status: Current Every Day Smoker    Packs/day: 0.25    Types: Cigarettes  . Smokeless tobacco: Never Used  Substance and Sexual Activity  . Alcohol use: Yes    Alcohol/week: 0.0 standard drinks    Comment: occasional  . Drug use: No  . Sexual activity: Yes    Birth control/protection: Injection  Other Topics Concern  . Not on file  Social History Narrative   Lives at home with her fiance and  her children   Left handed   Caffeine: all day long   Social Determinants of Health   Financial Resource Strain:   . Difficulty of Paying Living Expenses: Not on file  Food Insecurity:   . Worried About Programme researcher, broadcasting/film/video in the Last Year: Not on file  . Ran Out of Food in the Last Year: Not on file  Transportation Needs:   . Lack of Transportation (Medical): Not on file  . Lack of Transportation (Non-Medical): Not on file  Physical Activity:   . Days of Exercise per Week: Not on file  . Minutes of Exercise per Session: Not on file  Stress:   . Feeling of Stress : Not on file  Social Connections:   . Frequency of Communication with Friends and Family: Not on file  . Frequency of Social Gatherings with Friends and Family: Not on file  . Attends Religious Services: Not on file  . Active Member of Clubs or Organizations: Not on file  . Attends Banker Meetings: Not on file  . Marital Status: Not on file    Allergies: No Known Allergies  Metabolic Disorder Labs: No results found for: HGBA1C, MPG No results found for: PROLACTIN Lab Results  Component Value Date   CHOL 176 08/27/2019   TRIG 197.0 (H) 08/27/2019   HDL 38.40 (L) 08/27/2019   CHOLHDL 5 08/27/2019   VLDL 39.4 08/27/2019   LDLCALC 98 08/27/2019   LDLCALC 97 08/08/2017   Lab Results  Component Value Date   TSH 0.93 02/06/2019   TSH 1.02 12/03/2014    Therapeutic Level Labs: No results found for: LITHIUM No results found for: VALPROATE No components found for:  CBMZ  Current Medications: Current Outpatient Medications  Medication Sig Dispense Refill  . Aspirin-Acetaminophen-Caffeine (EXCEDRIN MIGRAINE PO) Take by mouth as needed.    . doxepin (SINEQUAN) 10 MG capsule Take 1-2 capsules (10-20 mg total) by mouth at bedtime as needed. For sleep 60 capsule 0  . lamoTRIgine (LAMICTAL) 150 MG tablet Take 1 tablet (150 mg total) by mouth daily. 90 tablet 0  . medroxyPROGESTERone Acetate 150 MG/ML  SUSY     . meloxicam (MOBIC) 15 MG tablet Take 1 tablet by mouth once daily (Patient not taking: Reported on 10/05/2019) 90 tablet 0  . ondansetron (ZOFRAN-ODT) 4 MG disintegrating tablet Take 1 tablet (4 mg total) by mouth every 8 (eight) hours as needed for nausea. Or for dizziness or headache and migraine. 30 tablet 3  . pregabalin (LYRICA) 50 MG capsule Take 1 capsule (50 mg total) by mouth 3 (three) times daily. 90 capsule 1  . promethazine-dextromethorphan (PROMETHAZINE-DM) 6.25-15 MG/5ML syrup Take 5 mLs by mouth 4 (four) times daily as needed. 118 mL 0  . propranolol (INDERAL) 20 MG tablet Take 1 tablet (20 mg total) by mouth 3 (three) times daily as needed. For severe anxiety attacks 90 tablet 1  .  rizatriptan (MAXALT-MLT) 10 MG disintegrating tablet Take 1 tablet (10 mg total) by mouth as needed for migraine. May repeat in 2 hours if needed 9 tablet 11  . rOPINIRole (REQUIP) 0.25 MG tablet Take 1 tablet (0.25 mg total) by mouth at bedtime. 30 tablet 1  . topiramate (TOPAMAX) 100 MG tablet Take 1 tablet by mouth once daily 30 tablet 2  . venlafaxine XR (EFFEXOR-XR) 150 MG 24 hr capsule Take 1 capsule (150 mg total) by mouth daily with breakfast. 90 capsule 11   No current facility-administered medications for this visit.     Musculoskeletal: Strength & Muscle Tone: UTA Gait & Station: normal Patient leans: N/A  Psychiatric Specialty Exam: Review of Systems  Neurological: Positive for headaches.  Psychiatric/Behavioral: Positive for dysphoric mood and sleep disturbance.  All other systems reviewed and are negative.   There were no vitals taken for this visit.There is no height or weight on file to calculate BMI.  General Appearance: Casual  Eye Contact:  Fair  Speech:  Clear and Coherent  Volume:  Normal  Mood:  Dysphoric and Irritable  Affect:  Congruent  Thought Process:  Goal Directed and Descriptions of Associations: Intact  Orientation:  Full (Time, Place, and Person)   Thought Content: Logical   Suicidal Thoughts:  No  Homicidal Thoughts:  No  Memory:  Immediate;   Fair Recent;   Fair Remote;   Fair  Judgement:  Fair  Insight:  Fair  Psychomotor Activity:  Normal  Concentration:  Concentration: Fair and Attention Span: Fair  Recall:  [employer] of Knowledge: Fair  Language: Fair  Akathisia:  No  Handed:  Right  AIMS (if indicated): UTA  Assets:  Communication Skills Desire for Improvement Housing Social Support  ADL's:  Intact  Cognition: WNL  Sleep:  Poor , does have restless legs    Screenings: PHQ2-9     Office Visit from 08/27/2019 in [place] at [facility] Visit from 08/21/2019 in [place] Visit from 08/09/2018 in [place] at [facility] Visit from 03/02/2018 in [place] at [facility] Visit from 08/08/2017 in [place] at [facility] Total Score  1  2  0  0  0  PHQ-9 Total Score  8  7  0  --  --       Assessment and Plan: Kaliope is a 34 year old Hispanic female, employed, lives in [place], has a history of bipolar disorder, skin picking disorder, panic attacks was evaluated by telemedicine today.  Patient is biologically predisposed given her history of trauma.  She also has psychosocial stressors of the current pandemic, her own medical problems and relationship struggles.  She is currently struggling with sleep problems, headaches and mood lability.  Plan as noted below.  Plan Bipolar disorder-some progress Lamotrigine 150 mg daily in divided dosage. Venlafaxine as prescribed Patient advised to follow-up with her providers for headache which could be contributing to her irritability.  Panic attacks-improving Propranolol 20 mg p.o. 3 times daily as needed Continue CBT with Mr. Eulas Post  Skin picking disorder-improving Continue CBT  Insomnia-restless Doxepin 10 to 20 mg p.o. nightly as needed Add Requip 0.25  mg at bedtime for restless leg symptoms  Tobacco use disorder-unstable Provided smoking cessation counseling.  Follow-up in clinic in 2 weeks or sooner if needed.  February 23 at 3 PM  I have spent atleast 20 minutes non face to face with patient today. More  than 50 % of the time was spent for preparing to see the patient ( e.g., review of test, records ), obtaining and to review and separately obtained history , ordering medications and test ,psychoeducation and supportive psychotherapy and care coordination,as well as documenting clinical information in electronic health records. This note was generated in part or whole with voice recognition software. Voice recognition is usually quite accurate but there are transcription errors that can and very often do occur. I apologize for any typographical errors that were not detected and corrected.       Jomarie Longs, MD 10/23/2019, 4:54 PM

## 2019-10-23 NOTE — Patient Instructions (Signed)
Ropinirole tablets What is this medicine? ROPINIROLE (roe PIN i role) is used to treat the symptoms of Parkinson's disease. It helps to improve muscle control and movement difficulties. It is also used for the treatment of Restless Legs Syndrome. This medicine may be used for other purposes; ask your health care provider or pharmacist if you have questions. COMMON BRAND NAME(S): Requip What should I tell my health care provider before I take this medicine? They need to know if you have any of these conditions:  heart disease  high blood pressure  kidney disease  liver disease  low blood pressure  narcolepsy  sleep apnea  an unusual or allergic reaction to ropinirole, other medicines, foods, dyes, or preservatives  pregnant or trying to get pregnant  breast-feeding How should I use this medicine? Take this medicine by mouth with a glass of water. Follow the directions on the prescription label. You can take it with or without food. If it upsets your stomach, take it with food. Take your doses at regular intervals. Do not take your medicine more often than directed. Do not stop taking this medicine except on your doctor's advice. Stopping this medicine too quickly may cause serious side effects. Talk to your pediatrician regarding the use of this medicine in children. Special care may be needed. Overdosage: If you think you have taken too much of this medicine contact a poison control center or emergency room at once. NOTE: This medicine is only for you. Do not share this medicine with others. What if I miss a dose? If you miss a dose, take it as soon as you can. If it is almost time for your next dose, take only that dose. Do not take double or extra doses. What may interact with this medicine?  certain medicines for depression, mood, or psychotic disorders  ciprofloxacin  female hormones, like estrogens and birth control  pills  fluvoxamine  metoclopramide  mexiletine  norfloxacin  omeprazole  rifampin This list may not describe all possible interactions. Give your health care provider a list of all the medicines, herbs, non-prescription drugs, or dietary supplements you use. Also tell them if you smoke, drink alcohol, or use illegal drugs. Some items may interact with your medicine. What should I watch for while using this medicine? Visit your health care professional for regular checks on your progress. Tell your health care professional if your symptoms do not start to get better or if they get worse. Do not stop taking except on your health care professional's advice. You may develop a severe reaction. Your health care professional will tell you how much medicine to take. You may get drowsy or dizzy. Do not drive, use machinery, or do anything that needs mental alertness until you know how this drug affects you. Do not stand or sit up quickly, especially if you are an older patient. This reduces the risk of dizzy or fainting spells. Alcohol may interfere with the effect of this medicine. Avoid alcoholic drinks. When taking this medicine, you may fall asleep without notice. You may be doing activities like driving a car, talking, or eating. You may not feel drowsy before it happens. Contact your health care provider right away if this happens to you. There have been reports of increased sexual urges or other strong urges such as gambling while taking this medicine. If you experience any of these while taking this medicine, you should report this to your health care provider as soon as possible. Your mouth may   get dry. Chewing sugarless gum or sucking hard candy and drinking plenty of water may help. Contact your health care professional if the problem does not go away or is severe. You should check your skin often for changes to moles and new growths while taking this medicine. Call your doctor if you notice  any of these changes. What side effects may I notice from receiving this medicine? Side effects that you should report to your doctor or health care professional as soon as possible:  allergic reactions like skin rash, itching or hives, swelling of the face, lips, or tongue  breathing problems  changes in emotions or moods  changes in vision  chest pain  confusion  falling asleep during normal activities like driving  fast, irregular heartbeat  hallucinations  joint or muscle pain  loss of bladder control  loss of memory  new or increased gambling urges, sexual urges, uncontrolled spending, binge or compulsive eating, or other urges  pain, tingling, numbness in the hands or feet  signs and symptoms of low blood pressure like dizziness; feeling faint or lightheaded, falls; unusually weak or tired  swelling of the ankles, feet, hands  uncontrollable movements of the arms, face, head, mouth, neck, or upper body  vomiting Side effects that usually do not require medical attention (report to your doctor or health care professional if they continue or are bothersome):  dizziness  drowsiness  headache  increased sweating  nausea This list may not describe all possible side effects. Call your doctor for medical advice about side effects. You may report side effects to FDA at 1-800-FDA-1088. Where should I keep my medicine? Keep out of the reach of children. Store at room temperature between 20 and 25 degrees C (68 and 77 degrees F). Protect from light and moisture. Keep container tightly closed. Throw away any unused medicine after the expiration date. NOTE: This sheet is a summary. It may not cover all possible information. If you have questions about this medicine, talk to your doctor, pharmacist, or health care provider.  2020 Elsevier/Gold Standard (2019-05-03 16:52:05)  

## 2019-10-25 ENCOUNTER — Ambulatory Visit (INDEPENDENT_AMBULATORY_CARE_PROVIDER_SITE_OTHER): Payer: Commercial Managed Care - PPO | Admitting: Clinical

## 2019-10-25 ENCOUNTER — Other Ambulatory Visit: Payer: Self-pay

## 2019-10-25 DIAGNOSIS — F410 Panic disorder [episodic paroxysmal anxiety] without agoraphobia: Secondary | ICD-10-CM | POA: Diagnosis not present

## 2019-10-25 DIAGNOSIS — F5105 Insomnia due to other mental disorder: Secondary | ICD-10-CM

## 2019-10-25 DIAGNOSIS — F3161 Bipolar disorder, current episode mixed, mild: Secondary | ICD-10-CM | POA: Diagnosis not present

## 2019-10-25 NOTE — Progress Notes (Addendum)
Virtual Visit via Video Note  I connected with Lindsay Nicholson on 10/25/19 at 11:00 AM EST by a video enabled telemedicine application and verified that I am speaking with the correct person using two identifiers.  Location: Patient: Home Provider: Office   I discussed the limitations of evaluation and management by telemedicine and the availability of in person appointments. The patient expressed understanding and agreed to proceed.      THERAPIST PROGRESS NOTE  Session Time: 11:00AM-11:45AM  Participation Level: Active  Behavioral Response: CasualAlertAngry and Anxious  Type of Therapy: Individual Therapy  Treatment Goals addressed: Coping  Interventions: CBT  Summary: Lindsay Nicholson is a 34 y.o. female who presents with Bipolar Disorder. The OPT therapist worked with the patient  for her initial session. The OPT therapist utilized Motivational Interviewing to assist in creating therapeutic repore. 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 The OPT therapist utilized Cognitive Behavioral Therapy through cognitive restructuring as well as worked with the patient on coping strategies to assist in management of Anxiety and Irritability. The OPT therapist inquired for holistic care about the patients adherence to medication therapy.  Suicidal/Homicidal: Yeswithout intent/plan  Therapist Response: The OPT therapist worked with the patient for the patients initial scheduled session. The patient was engaged in her session and gave feedback in relation to triggers, symptoms, and behavior responses over the past 2 weeks. The OPT therapist worked with the patient utilizing an in session Cognitive Behavioral Therapy exercise. The patient was responsive in the session and verbalized, " I am willing to work on thinking before I respond when I get  mad so I don't lash out on people". The patient indicated both compliance and effectiveness in relation to her current medication therapy . The OPT therapist will continue treatment work with the patient in her next scheduled session.  Plan: Return again in 2 weeks.  Diagnosis: Axis I: Bipolar 1 disorder, mixed, mild , Panic Attacks, and Insomnia due to mental disorder    Axis II: No diagnosis  I discussed the assessment and treatment plan with the patient. The patient was provided an opportunity to ask questions and all were answered. The patient agreed with the plan and demonstrated an understanding of the instructions.   The patient was advised to call back or seek an in-person evaluation if the symptoms worsen or if the condition fails to improve as anticipated.  I provided 40 minutes of non-face-to-face time during this encounter.  Winfred Burn, LCSW 10/25/2019

## 2019-11-05 ENCOUNTER — Encounter
Payer: Commercial Managed Care - PPO | Attending: Physical Medicine & Rehabilitation | Admitting: Physical Medicine and Rehabilitation

## 2019-11-05 ENCOUNTER — Encounter: Payer: Self-pay | Admitting: Physical Medicine and Rehabilitation

## 2019-11-05 ENCOUNTER — Other Ambulatory Visit: Payer: Self-pay

## 2019-11-05 VITALS — BP 113/77 | HR 95 | Temp 97.8°F | Ht 64.0 in | Wt 129.0 lb

## 2019-11-05 DIAGNOSIS — M25512 Pain in left shoulder: Secondary | ICD-10-CM

## 2019-11-05 DIAGNOSIS — G8929 Other chronic pain: Secondary | ICD-10-CM | POA: Diagnosis present

## 2019-11-05 DIAGNOSIS — M7918 Myalgia, other site: Secondary | ICD-10-CM | POA: Diagnosis present

## 2019-11-05 DIAGNOSIS — R519 Headache, unspecified: Secondary | ICD-10-CM

## 2019-11-05 DIAGNOSIS — G4701 Insomnia due to medical condition: Secondary | ICD-10-CM

## 2019-11-05 DIAGNOSIS — M7592 Shoulder lesion, unspecified, left shoulder: Secondary | ICD-10-CM | POA: Diagnosis not present

## 2019-11-05 MED ORDER — AMITRIPTYLINE HCL 150 MG PO TABS: 150.0000 mg | ORAL_TABLET | Freq: Every day | ORAL | 0 refills | Status: DC
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
## 2019-11-06 ENCOUNTER — Encounter: Payer: Self-pay | Admitting: Psychiatry

## 2019-11-06 ENCOUNTER — Ambulatory Visit (INDEPENDENT_AMBULATORY_CARE_PROVIDER_SITE_OTHER): Payer: Commercial Managed Care - PPO | Admitting: Psychiatry

## 2019-11-06 DIAGNOSIS — F410 Panic disorder [episodic paroxysmal anxiety] without agoraphobia: Secondary | ICD-10-CM

## 2019-11-06 DIAGNOSIS — F3161 Bipolar disorder, current episode mixed, mild: Secondary | ICD-10-CM

## 2019-11-06 DIAGNOSIS — F5105 Insomnia due to other mental disorder: Secondary | ICD-10-CM | POA: Diagnosis not present

## 2019-11-06 DIAGNOSIS — F424 Excoriation (skin-picking) disorder: Secondary | ICD-10-CM | POA: Diagnosis not present

## 2019-11-06 DIAGNOSIS — F17200 Nicotine dependence, unspecified, uncomplicated: Secondary | ICD-10-CM

## 2019-11-06 MED ORDER — ARIPIPRAZOLE 2 MG PO TABS: 2.0000 mg | ORAL_TABLET | Freq: Every day | ORAL | 1 refills | Status: DC
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
## 2019-11-12 ENCOUNTER — Encounter: Payer: Self-pay | Admitting: Internal Medicine

## 2019-11-12 NOTE — Telephone Encounter (Signed)
Pt did not want to schedule appt until Fri, please advise if there maybe something else in the meantime pt may need to know--see mychart msg above

## 2019-11-13 NOTE — Telephone Encounter (Signed)
Will discuss at upcoming appt.

## 2019-11-16 ENCOUNTER — Ambulatory Visit (INDEPENDENT_AMBULATORY_CARE_PROVIDER_SITE_OTHER): Payer: Commercial Managed Care - PPO | Admitting: Internal Medicine

## 2019-11-16 ENCOUNTER — Encounter: Payer: Self-pay | Admitting: Internal Medicine

## 2019-11-16 ENCOUNTER — Other Ambulatory Visit: Payer: Self-pay

## 2019-11-16 VITALS — BP 114/72 | HR 96 | Temp 97.9°F | Wt 134.0 lb

## 2019-11-16 DIAGNOSIS — R519 Headache, unspecified: Secondary | ICD-10-CM

## 2019-11-16 DIAGNOSIS — R140 Abdominal distension (gaseous): Secondary | ICD-10-CM | POA: Diagnosis not present

## 2019-11-16 DIAGNOSIS — M545 Low back pain, unspecified: Secondary | ICD-10-CM

## 2019-11-16 DIAGNOSIS — R5383 Other fatigue: Secondary | ICD-10-CM

## 2019-11-16 DIAGNOSIS — R0981 Nasal congestion: Secondary | ICD-10-CM

## 2019-11-16 DIAGNOSIS — R197 Diarrhea, unspecified: Secondary | ICD-10-CM | POA: Diagnosis not present

## 2019-11-16 DIAGNOSIS — N912 Amenorrhea, unspecified: Secondary | ICD-10-CM | POA: Diagnosis not present

## 2019-11-16 DIAGNOSIS — R110 Nausea: Secondary | ICD-10-CM

## 2019-11-16 MED ORDER — ONDANSETRON HCL 4 MG PO TABS: 4.0000 mg | ORAL_TABLET | Freq: Three times a day (TID) | ORAL | 0 refills | Status: DC | PRN

## 2019-11-16 NOTE — Patient Instructions (Signed)
COVID-19 COVID-19 is a respiratory infection that is caused by a virus called severe acute respiratory syndrome coronavirus 2 (SARS-CoV-2). The disease is also known as coronavirus disease or novel coronavirus. In some people, the virus may not cause any symptoms. In others, it may cause a serious infection. The infection can get worse quickly and can lead to complications, such as:  Pneumonia, or infection of the lungs.  Acute respiratory distress syndrome or ARDS. This is a condition in which fluid build-up in the lungs prevents the lungs from filling with air and passing oxygen into the blood.  Acute respiratory failure. This is a condition in which there is not enough oxygen passing from the lungs to the body or when carbon dioxide is not passing from the lungs out of the body.  Sepsis or septic shock. This is a serious bodily reaction to an infection.  Blood clotting problems.  Secondary infections due to bacteria or fungus.  Organ failure. This is when your body's organs stop working. The virus that causes COVID-19 is contagious. This means that it can spread from person to person through droplets from coughs and sneezes (respiratory secretions). What are the causes? This illness is caused by a virus. You may catch the virus by:  Breathing in droplets from an infected person. Droplets can be spread by a person breathing, speaking, singing, coughing, or sneezing.  Touching something, like a table or a doorknob, that was exposed to the virus (contaminated) and then touching your mouth, nose, or eyes. What increases the risk? Risk for infection You are more likely to be infected with this virus if you:  Are within 6 feet (2 meters) of a person with COVID-19.  Provide care for or live with a person who is infected with COVID-19.  Spend time in crowded indoor spaces or live in shared housing. Risk for serious illness You are more likely to become seriously ill from the virus if you:   Are 50 years of age or older. The higher your age, the more you are at risk for serious illness.  Live in a nursing home or long-term care facility.  Have cancer.  Have a long-term (chronic) disease such as: ? Chronic lung disease, including chronic obstructive pulmonary disease or asthma. ? A long-term disease that lowers your body's ability to fight infection (immunocompromised). ? Heart disease, including heart failure, a condition in which the arteries that lead to the heart become narrow or blocked (coronary artery disease), a disease which makes the heart muscle thick, weak, or stiff (cardiomyopathy). ? Diabetes. ? Chronic kidney disease. ? Sickle cell disease, a condition in which red blood cells have an abnormal "sickle" shape. ? Liver disease.  Are obese. What are the signs or symptoms? Symptoms of this condition can range from mild to severe. Symptoms may appear any time from 2 to 14 days after being exposed to the virus. They include:  A fever or chills.  A cough.  Difficulty breathing.  Headaches, body aches, or muscle aches.  Runny or stuffy (congested) nose.  A sore throat.  New loss of taste or smell. Some people may also have stomach problems, such as nausea, vomiting, or diarrhea. Other people may not have any symptoms of COVID-19. How is this diagnosed? This condition may be diagnosed based on:  Your signs and symptoms, especially if: ? You live in an area with a COVID-19 outbreak. ? You recently traveled to or from an area where the virus is common. ? You   provide care for or live with a person who was diagnosed with COVID-19. ? You were exposed to a person who was diagnosed with COVID-19.  A physical exam.  Lab tests, which may include: ? Taking a sample of fluid from the back of your nose and throat (nasopharyngeal fluid), your nose, or your throat using a swab. ? A sample of mucus from your lungs (sputum). ? Blood tests.  Imaging tests, which  may include, X-rays, CT scan, or ultrasound. How is this treated? At present, there is no medicine to treat COVID-19. Medicines that treat other diseases are being used on a trial basis to see if they are effective against COVID-19. Your health care provider will talk with you about ways to treat your symptoms. For most people, the infection is mild and can be managed at home with rest, fluids, and over-the-counter medicines. Treatment for a serious infection usually takes places in a hospital intensive care unit (ICU). It may include one or more of the following treatments. These treatments are given until your symptoms improve.  Receiving fluids and medicines through an IV.  Supplemental oxygen. Extra oxygen is given through a tube in the nose, a face mask, or a hood.  Positioning you to lie on your stomach (prone position). This makes it easier for oxygen to get into the lungs.  Continuous positive airway pressure (CPAP) or bi-level positive airway pressure (BPAP) machine. This treatment uses mild air pressure to keep the airways open. A tube that is connected to a motor delivers oxygen to the body.  Ventilator. This treatment moves air into and out of the lungs by using a tube that is placed in your windpipe.  Tracheostomy. This is a procedure to create a hole in the neck so that a breathing tube can be inserted.  Extracorporeal membrane oxygenation (ECMO). This procedure gives the lungs a chance to recover by taking over the functions of the heart and lungs. It supplies oxygen to the body and removes carbon dioxide. Follow these instructions at home: Lifestyle  If you are sick, stay home except to get medical care. Your health care provider will tell you how long to stay home. Call your health care provider before you go for medical care.  Rest at home as told by your health care provider.  Do not use any products that contain nicotine or tobacco, such as cigarettes, e-cigarettes, and  chewing tobacco. If you need help quitting, ask your health care provider.  Return to your normal activities as told by your health care provider. Ask your health care provider what activities are safe for you. General instructions  Take over-the-counter and prescription medicines only as told by your health care provider.  Drink enough fluid to keep your urine pale yellow.  Keep all follow-up visits as told by your health care provider. This is important. How is this prevented?  There is no vaccine to help prevent COVID-19 infection. However, there are steps you can take to protect yourself and others from this virus. To protect yourself:   Do not travel to areas where COVID-19 is a risk. The areas where COVID-19 is reported change often. To identify high-risk areas and travel restrictions, check the CDC travel website: wwwnc.cdc.gov/travel/notices  If you live in, or must travel to, an area where COVID-19 is a risk, take precautions to avoid infection. ? Stay away from people who are sick. ? Wash your hands often with soap and water for 20 seconds. If soap and water   are not available, use an alcohol-based hand sanitizer. ? Avoid touching your mouth, face, eyes, or nose. ? Avoid going out in public, follow guidance from your state and local health authorities. ? If you must go out in public, wear a cloth face covering or face mask. Make sure your mask covers your nose and mouth. ? Avoid crowded indoor spaces. Stay at least 6 feet (2 meters) away from others. ? Disinfect objects and surfaces that are frequently touched every day. This may include:  Counters and tables.  Doorknobs and light switches.  Sinks and faucets.  Electronics, such as phones, remote controls, keyboards, computers, and tablets. To protect others: If you have symptoms of COVID-19, take steps to prevent the virus from spreading to others.  If you think you have a COVID-19 infection, contact your health care  provider right away. Tell your health care team that you think you may have a COVID-19 infection.  Stay home. Leave your house only to seek medical care. Do not use public transport.  Do not travel while you are sick.  Wash your hands often with soap and water for 20 seconds. If soap and water are not available, use alcohol-based hand sanitizer.  Stay away from other members of your household. Let healthy household members care for children and pets, if possible. If you have to care for children or pets, wash your hands often and wear a mask. If possible, stay in your own room, separate from others. Use a different bathroom.  Make sure that all people in your household wash their hands well and often.  Cough or sneeze into a tissue or your sleeve or elbow. Do not cough or sneeze into your hand or into the air.  Wear a cloth face covering or face mask. Make sure your mask covers your nose and mouth. Where to find more information  Centers for Disease Control and Prevention: www.cdc.gov/coronavirus/2019-ncov/index.html  World Health Organization: www.who.int/health-topics/coronavirus Contact a health care provider if:  You live in or have traveled to an area where COVID-19 is a risk and you have symptoms of the infection.  You have had contact with someone who has COVID-19 and you have symptoms of the infection. Get help right away if:  You have trouble breathing.  You have pain or pressure in your chest.  You have confusion.  You have bluish lips and fingernails.  You have difficulty waking from sleep.  You have symptoms that get worse. These symptoms may represent a serious problem that is an emergency. Do not wait to see if the symptoms will go away. Get medical help right away. Call your local emergency services (911 in the [place] Do not drive yourself to the hospital. Let the emergency medical personnel know if you think you have COVID-19. Summary  COVID-19 is a  respiratory infection that is caused by a virus. It is also known as coronavirus disease or novel coronavirus. It can cause serious infections, such as pneumonia, acute respiratory distress syndrome, acute respiratory failure, or sepsis.  The virus that causes COVID-19 is contagious. This means that it can spread from person to person through droplets from breathing, speaking, singing, coughing, or sneezing.  You are more likely to develop a serious illness if you are 50 years of age or older, have a weak immune system, live in a nursing home, or have chronic disease.  There is no medicine to treat COVID-19. Your health care provider will talk with you about ways to treat your symptoms.    Take steps to protect yourself and others from infection. Wash your hands often and disinfect objects and surfaces that are frequently touched every day. Stay away from people who are sick and wear a mask if you are sick. This information is not intended to replace advice given to you by your health care provider. Make sure you discuss any questions you have with your health care provider. Document Revised: 06/29/2019 Document Reviewed: 10/05/2018 Elsevier Patient Education  2020 [employer] Inc.  

## 2019-11-16 NOTE — Progress Notes (Signed)
Subjective:    Patient ID: Lindsay Nicholson, female    DOB: 12-18-1985, 34 y.o.   MRN: [id number]  HPI  Pt presents to the clinic today with c/o fatigue, headache, nasal congestion, nausea, reflux, bloating, diarrhea and low back pain. This started 2 weeks ago. She is not sleeping well at night but this is not new. The headache is located in her left temple. She describes the pain as aching. She denies dizziness, visual changes, sensitivity to light or sound. She is not blowing anything out of her nose. She denies runny nose, ear pain, sore throat, loss of taste/smell, cough or SOB. She denies abdominal pain, vomiting or blood in her stool. She is getting Depo injections but missed her last injection. She is sexually active. She has taken 2 home pregnancy tests which were negative. She has not had exposure to Covid that she is aware of.  Review of Systems      Past Medical History:  Diagnosis Date  . Anxiety   . Cervicalgia   . SVD (spontaneous vaginal delivery) 09/14/2011    Current Outpatient Medications  Medication Sig Dispense Refill  . amitriptyline (ELAVIL) 150 MG tablet Take 1 tablet (150 mg total) by mouth at bedtime. 5 tablet 0  . ARIPiprazole (ABILIFY) 2 MG tablet Take 1 tablet (2 mg total) by mouth daily. 30 tablet 1  . Aspirin-Acetaminophen-Caffeine (EXCEDRIN MIGRAINE PO) Take by mouth as needed.    . lamoTRIgine (LAMICTAL) 150 MG tablet Take 1 tablet (150 mg total) by mouth daily. 90 tablet 0  . medroxyPROGESTERone Acetate 150 MG/ML SUSY     . meloxicam (MOBIC) 15 MG tablet Take 1 tablet by mouth once daily 90 tablet 0  . ondansetron (ZOFRAN-ODT) 4 MG disintegrating tablet Take 1 tablet (4 mg total) by mouth every 8 (eight) hours as needed for nausea. Or for dizziness or headache and migraine. 30 tablet 3  . pregabalin (LYRICA) 50 MG capsule Take 1 capsule (50 mg total) by mouth 3 (three) times daily. 90 capsule 1  . promethazine-dextromethorphan (PROMETHAZINE-DM) 6.25-15 MG/5ML  syrup Take 5 mLs by mouth 4 (four) times daily as needed. 118 mL 0  . propranolol (INDERAL) 20 MG tablet Take 1 tablet (20 mg total) by mouth 3 (three) times daily as needed. For severe anxiety attacks 90 tablet 1  . rizatriptan (MAXALT-MLT) 10 MG disintegrating tablet Take 1 tablet (10 mg total) by mouth as needed for migraine. May repeat in 2 hours if needed 9 tablet 11  . rOPINIRole (REQUIP) 0.25 MG tablet Take 1 tablet (0.25 mg total) by mouth at bedtime. 30 tablet 1  . topiramate (TOPAMAX) 100 MG tablet Take 1 tablet by mouth once daily 30 tablet 2  . venlafaxine XR (EFFEXOR-XR) 150 MG 24 hr capsule Take 1 capsule (150 mg total) by mouth daily with breakfast. 90 capsule 11   No current facility-administered medications for this visit.    No Known Allergies  Family History  Problem Relation Age of Onset  . Diabetes Father   . Hypertension Father   . High Cholesterol Father   . Anesthesia problems Neg Hx   . Cancer Neg Hx   . Heart disease Neg Hx   . Stroke Neg Hx   . Migraines Neg Hx     Social History   Socioeconomic History  . Marital status: Single    Spouse name: Not on file  . Number of children: 3  . Years of education: Not on file  .  Highest education level: Associate degree: occupational, Scientist, product/process development, or vocational program  Occupational History  . Not on file  Tobacco Use  . Smoking status: Current Every Day Smoker    Packs/day: 0.25    Types: Cigarettes  . Smokeless tobacco: Never Used  Substance and Sexual Activity  . Alcohol use: Yes    Alcohol/week: 0.0 standard drinks    Comment: occasional  . Drug use: No  . Sexual activity: Yes    Birth control/protection: Injection  Other Topics Concern  . Not on file  Social History Narrative   Lives at home with her fiance and her children   Left handed   Caffeine: all day long   Social Determinants of Health   Financial Resource Strain:   . Difficulty of Paying Living Expenses: Not on file  Food  Insecurity:   . Worried About Programme researcher, broadcasting/film/video in the Last Year: Not on file  . Ran Out of Food in the Last Year: Not on file  Transportation Needs:   . Lack of Transportation (Medical): Not on file  . Lack of Transportation (Non-Medical): Not on file  Physical Activity:   . Days of Exercise per Week: Not on file  . Minutes of Exercise per Session: Not on file  Stress:   . Feeling of Stress : Not on file  Social Connections:   . Frequency of Communication with Friends and Family: Not on file  . Frequency of Social Gatherings with Friends and Family: Not on file  . Attends Religious Services: Not on file  . Active Member of Clubs or Organizations: Not on file  . Attends Banker Meetings: Not on file  . Marital Status: Not on file  Intimate Partner Violence:   . Fear of Current or Ex-Partner: Not on file  . Emotionally Abused: Not on file  . Physically Abused: Not on file  . Sexually Abused: Not on file     Constitutional: Pt reports fatigue, headache. Denies fever, malaise, or abrupt weight changes.  HEENT: Pt reports nasal congestion. Denies eye pain, eye redness, ear pain, ringing in the ears, wax buildup, runny nose, bloody nose, or sore throat. Respiratory: Denies difficulty breathing, shortness of breath, cough or sputum production.   Cardiovascular: Denies chest pain, chest tightness, palpitations or swelling in the hands or feet.  Gastrointestinal: Pt reports nausea, reflux, abdominal bloating and diarrhea. Denies abdominal pain, constipation, or blood in the stool.  GU: Denies urgency, frequency, pain with urination, burning sensation, blood in urine, odor or discharge. Musculoskeletal: Pt reports back pain. Denies decrease in range of motion, difficulty with gait, muscle pain or joint pain and swelling.  Skin: Denies redness, rashes, lesions or ulcercations.  Neurological: Denies dizziness, difficulty with memory, difficulty with speech or problems with  balance and coordination.   No other specific complaints in a complete review of systems (except as listed in HPI above).   Objective:   Physical Exam   BP 114/72   Pulse 96   Temp 97.9 F (36.6 C) (Temporal)   Wt 134 lb (60.8 kg)   SpO2 98%   BMI 23.00 kg/m   Wt Readings from Last 3 Encounters:  11/05/19 129 lb (58.5 kg)  10/03/19 129 lb (58.5 kg)  09/18/19 127 lb (57.6 kg)    General: Appears her stated age, well developed, well nourished in NAD. Skin: Warm, dry and intact. No rashesnoted. HEENT: Head: normal shape and size; Eyes: sclera white, no icterus, conjunctiva pink, PERRLA and  EOMs intact;  Neck:  Neck supple, trachea midline. No masses, lumps or thyromegaly present.  Cardiovascular: Normal rate and rhythm. S1,S2 noted.  No murmur, rubs or gallops noted.  Pulmonary/Chest: Normal effort and positive vesicular breath sounds. No respiratory distress. No wheezes, rales or ronchi noted.  Abdomen: Soft and nontender. Normal bowel sounds. No distention or masses noted. No CVA tenderness noted. Neurological: Alert and oriented.    BMET    Component Value Date/Time   NA 142 08/27/2019 1225   K 3.6 08/27/2019 1225   CL 110 08/27/2019 1225   CO2 23 08/27/2019 1225   GLUCOSE 90 08/27/2019 1225   BUN 13 08/27/2019 1225   CREATININE 1.01 08/27/2019 1225   CALCIUM 9.6 08/27/2019 1225   GFRNONAA >60 11/16/2015 0832   GFRAA >60 11/16/2015 0832    Lipid Panel     Component Value Date/Time   CHOL 176 08/27/2019 1225   TRIG 197.0 (H) 08/27/2019 1225   HDL 38.40 (L) 08/27/2019 1225   CHOLHDL 5 08/27/2019 1225   VLDL 39.4 08/27/2019 1225   LDLCALC 98 08/27/2019 1225    CBC    Component Value Date/Time   WBC 7.2 02/06/2019 1602   RBC 4.22 02/06/2019 1602   HGB 13.4 02/06/2019 1602   HCT 39.3 02/06/2019 1602   PLT 269.0 02/06/2019 1602   MCV 93.2 02/06/2019 1602   MCH 30.5 11/16/2015 0832   MCHC 34.1 02/06/2019 1602   RDW 15.1 02/06/2019 1602   LYMPHSABS 0.9  11/16/2015 0832   MONOABS 0.8 11/16/2015 0832   EOSABS 0.0 11/16/2015 0832   BASOSABS 0.0 11/16/2015 0832    Hgb A1C No results found for: HGBA1C         Assessment & Plan:   Acute Headache, Fatigue, Nasal Congestion, Nausea, Reflux, Abdominal Bloating and Diarrhea:  Urine pregnancy negative Concern for Covid Advised her to go [facility] or [facility] for send off testing Encouraged rest and fluids Can take Tylenol for headache Can take Flonase for nasal congestion RX for Zofran 4 mg TID prn for nausea, reflux and abdominal bloating Avoid antidiarrheals Work note provided- recommend self quarantine until results are back Recommend frequent handwashing, masking and social distancing even in the home  Return/ER precautions discussed Webb Silversmith, NP   Webb Silversmith, NP This visit occurred during the SARS-CoV-2 public health emergency.  Safety protocols were in place, including screening questions prior to the visit, additional usage of staff PPE, and extensive cleaning of exam room while observing appropriate contact time as indicated for disinfecting solutions.

## 2019-11-17 LAB — POCT URINE PREGNANCY: Preg Test, Ur: NEGATIVE

## 2019-11-24 ENCOUNTER — Other Ambulatory Visit: Payer: Self-pay | Admitting: Psychiatry

## 2019-11-24 DIAGNOSIS — F3181 Bipolar II disorder: Secondary | ICD-10-CM

## 2019-11-26 ENCOUNTER — Ambulatory Visit: Payer: Commercial Managed Care - PPO | Admitting: Psychiatry

## 2019-11-26 ENCOUNTER — Encounter: Payer: Self-pay | Admitting: Internal Medicine

## 2019-11-26 ENCOUNTER — Telehealth (HOSPITAL_COMMUNITY): Payer: Self-pay | Admitting: Clinical

## 2019-11-26 ENCOUNTER — Other Ambulatory Visit: Payer: Self-pay

## 2019-11-26 ENCOUNTER — Ambulatory Visit (HOSPITAL_COMMUNITY): Payer: Commercial Managed Care - PPO | Admitting: Clinical

## 2019-11-26 NOTE — Telephone Encounter (Signed)
Pt is calling; pt having sharp pain on rt and lt side of chest that comes and goes since last night. Pt is not having pain now. When takes deep breath or cough the pain is worse; when has the pain in chest the pain level is 6. Pt has prod cough with white phlegm that started on 11/16/19.pt has lt shoulder pain that pt said is unrelated to her other symptoms and pt is already seeing a doctor about shoulder pain.pain from chest does not radiate into shoulder.pt is having H/A,pain level now is 8. Pt has had fatigue for 2 - 3 weeks. No fever, chills,weakness,rash,SOB,abd pain,diarrhea,loss of taste or smell, S/T,runny nose,bruising,bleeding and no vomiting. No other covid symptoms and pt has not traveled or been exposed to + covid. [facility] which was negative. I spoke with Pamala Hurry NP and she advised respiratory Clinic; pt notified as instructed and pt does not want to wait until this evening at 6 PM to be seen so pt will go to [facility].FYI to Pamala Hurry NP.

## 2019-11-26 NOTE — Telephone Encounter (Signed)
Sent Lamictal to pharmacy 

## 2019-11-26 NOTE — Telephone Encounter (Signed)
The OPT therapist attempted text to session x2 @ 1:00PM and 1:10PM, however, the patient did not respond and missed their scheduled session.

## 2019-11-27 ENCOUNTER — Encounter: Payer: Self-pay | Admitting: Internal Medicine

## 2019-11-28 ENCOUNTER — Encounter: Payer: Self-pay | Admitting: Internal Medicine

## 2019-11-28 ENCOUNTER — Ambulatory Visit (INDEPENDENT_AMBULATORY_CARE_PROVIDER_SITE_OTHER): Payer: Commercial Managed Care - PPO | Admitting: Family Medicine

## 2019-11-28 VITALS — BP 110/62 | HR 98 | Temp 98.8°F | Wt 135.0 lb

## 2019-11-28 DIAGNOSIS — R0602 Shortness of breath: Secondary | ICD-10-CM

## 2019-11-28 DIAGNOSIS — F3161 Bipolar disorder, current episode mixed, mild: Secondary | ICD-10-CM

## 2019-11-28 DIAGNOSIS — G8929 Other chronic pain: Secondary | ICD-10-CM | POA: Diagnosis not present

## 2019-11-28 DIAGNOSIS — F17200 Nicotine dependence, unspecified, uncomplicated: Secondary | ICD-10-CM

## 2019-11-28 DIAGNOSIS — R5383 Other fatigue: Secondary | ICD-10-CM | POA: Diagnosis not present

## 2019-11-28 DIAGNOSIS — R52 Pain, unspecified: Secondary | ICD-10-CM | POA: Diagnosis not present

## 2019-11-28 NOTE — Patient Instructions (Signed)
Recommendation management of viral illness include: Vitamin D 5,000 IU daily Vitamin C 500 mg twice daily Zinc 50 mg daily       Weakness Weakness is a lack of strength. You may feel weak all over your body (generalized), or you may feel weak in one part of your body (focal). There are many potential causes of weakness. Sometimes, the cause of your weakness may not be known. Some causes of weakness can be serious, so it is important to see your doctor. Follow these instructions at home: Activity  Rest as needed.  Try to get enough sleep. Most adults need 7-8 hours of sleep each night. Talk to your doctor about how much sleep you need each night.  Do exercises, such as arm curls and leg raises, for 30 minutes at least 2 days a week or as told by your doctor.  Think about working with a physical therapist or trainer to help you get stronger. General instructions   Take over-the-counter and prescription medicines only as told by your doctor.  Eat a healthy, well-balanced diet. This includes: ? Proteins to build muscles, such as lean meats and fish. ? Fresh fruits and vegetables. ? Carbohydrates to boost energy, such as whole grains.  Drink enough fluid to keep your pee (urine) pale yellow.  Keep all follow-up visits as told by your doctor. This is important. Contact a doctor if:  Your weakness does not get better or it gets worse.  Your weakness affects your ability to: ? Think clearly. ? Do your normal daily activities. Get help right away if you:  Have sudden weakness on one side of your face or body.  Have chest pain.  Have trouble breathing or shortness of breath.  Have problems with your vision.  Have trouble talking or swallowing.  Have trouble standing or walking.  Are light-headed.  Pass out (lose consciousness). Summary  Weakness is a lack of strength. You may feel weak all over your body or just in one part of your body.  There are many potential  causes of weakness. Sometimes, the cause of your weakness may not be known.  Rest as needed, and try to get enough sleep. Most adults need 7-8 hours of sleep each night.  Eat a healthy, well-balanced diet. This information is not intended to replace advice given to you by your health care provider. Make sure you discuss any questions you have with your health care provider. Document Revised: 04/05/2018 Document Reviewed: 04/05/2018 Elsevier Patient Education  [employer].    Fatigue If you have fatigue, you feel tired all the time and have a lack of energy or a lack of motivation. Fatigue may make it difficult to start or complete tasks because of exhaustion. In general, occasional or mild fatigue is often a normal response to activity or life. However, long-lasting (chronic) or extreme fatigue may be a symptom of a medical condition. Follow these instructions at home: General instructions  Watch your fatigue for any changes.  Go to bed and get up at the same time every day.  Avoid fatigue by pacing yourself during the day and getting enough sleep at night.  Maintain a healthy weight. Medicines  Take over-the-counter and prescription medicines only as told by your health care provider.  Take a multivitamin, if told by your health care provider.  Do not use herbal or dietary supplements unless they are approved by your health care provider. Activity   Exercise regularly, as told by your health  care provider.  Use or practice techniques to help you relax, such as yoga, tai chi, meditation, or massage therapy. Eating and drinking   Avoid heavy meals in the evening.  Eat a well-balanced diet, which includes lean proteins, whole grains, plenty of fruits and vegetables, and low-fat dairy products.  Avoid consuming too much caffeine.  Avoid the use of alcohol.  Drink enough fluid to keep your urine pale yellow. Lifestyle  Change situations that cause you stress. Try  to keep your work and personal schedule in balance.  Do not use any products that contain nicotine or tobacco, such as cigarettes and e-cigarettes. If you need help quitting, ask your health care provider.  Do not use drugs. Contact a health care provider if:  Your fatigue does not get better.  You have a fever.  You suddenly lose or gain weight.  You have headaches.  You have trouble falling asleep or sleeping through the night.  You feel angry, guilty, anxious, or sad.  You are unable to have a bowel movement (constipation).  Your skin is dry.  You have swelling in your legs or another part of your body. Get help right away if:  You feel confused.  Your vision is blurry.  You feel faint or you pass out.  You have a severe headache.  You have severe pain in your abdomen, your back, or the area between your waist and hips (pelvis).  You have chest pain, shortness of breath, or an irregular or fast heartbeat.  You are unable to urinate, or you urinate less than normal.  You have abnormal bleeding, such as bleeding from the rectum, vagina, nose, lungs, or nipples.  You vomit blood.  You have thoughts about hurting yourself or others. If you ever feel like you may hurt yourself or others, or have thoughts about taking your own life, get help right away. You can go to your nearest emergency department or call:  Your local emergency services (911 in the [place]  A suicide crisis helpline, such as the [place] at 213-498-8381. This is open 24 hours a day. Summary  If you have fatigue, you feel tired all the time and have a lack of energy or a lack of motivation.  Fatigue may make it difficult to start or complete tasks because of exhaustion.  Long-lasting (chronic) or extreme fatigue may be a symptom of a medical condition.  Exercise regularly, as told by your health care provider.  Change situations that cause you stress. Try to keep  your work and personal schedule in balance. This information is not intended to replace advice given to you by your health care provider. Make sure you discuss any questions you have with your health care provider. Document Revised: 03/21/2019 Document Reviewed: 05/25/2017 Elsevier Patient Education  2020 [employer].

## 2019-11-28 NOTE — Progress Notes (Signed)
Patient ID: Lindsay Nicholson, female    DOB: 02/13/1986, 34 y.o.   MRN: [id number]  PCP: Jearld Fenton, NP  Chief Complaint  Patient presents with  . Fatigue  . Generalized Body Aches    Subjective:  HPI Lindsay Nicholson is a 34 y.o. female presents to [facility] for evaluation of URI symptoms following a negative COVID-19 test.  Medical history significant for chronic pain, chronic fatigue, Bipolar 1 w/mixed episodes, and chronic tobacco use disorder.  Onset of symptoms first week of March. Patient has been tested for COVID-19 x 2 in less than 30 days with both results were negative. She states being prescribed an inhaler and "pills" for which she never took. Occasionally uses inhaler although not as often as prescribed. She is continuing to smoke and doesn't have a cough. Endorse severe fatigue and generalized body aches , however suffers from chronic fatigue and chronic pain syndrome. She endorses that she typically falls asleep around 4 or 5 am and is unable to awakened to go to work. She suffers from insomnia and reports nothing that she has been prescribed in the past works. Currently followed by [facility] and Physical Medicine for pan management. She was recently stated on Lyrica and is not taking the medication due to she states "it doesn't work". She complains of generalized body aches and is unable to take OTC medications as they do not work either. She has tried Naproxen and ibuprofen for body pain and doesn't work.    Review of Systems Pertinent negatives listed in HPI  Patient Active Problem List   Diagnosis Date Noted  . Supraspinatus tendinitis, left 11/05/2019  . Bipolar 1 disorder, mixed, mild ([place]) 10/05/2019  . Insomnia due to mental disorder 08/20/2019  . Myofascial pain syndrome, cervical 03/19/2019  . Bipolar 2 disorder ([place]) 03/15/2019  . Panic attacks 03/15/2019  . Skin-picking disorder 03/15/2019  . Tobacco use disorder 03/15/2019  . GERD  (gastroesophageal reflux disease) 08/09/2018  . Chronic shoulder pain 08/09/2018  . Anxiety state 12/03/2014  . Frequent headaches 12/03/2014      Prior to Admission medications   Medication Sig Start Date End Date Taking? Authorizing Provider  albuterol (VENTOLIN HFA) 108 (90 Base) MCG/ACT inhaler  11/26/19  Yes [provider]  ARIPiprazole (ABILIFY) 2 MG tablet Take 1 tablet (2 mg total) by mouth daily. 11/06/19  Yes Ursula Alert, MD  lamoTRIgine (LAMICTAL) 150 MG tablet Take 1 tablet (150 mg total) by mouth daily. 11/26/19  Yes Ursula Alert, MD  medroxyPROGESTERone Acetate 150 MG/ML SUSY  03/12/19  Yes [provider]  pregabalin (LYRICA) 50 MG capsule Take 1 capsule (50 mg total) by mouth 3 (three) times daily. 10/03/19  Yes Raulkar, Clide Deutscher, MD  propranolol (INDERAL) 20 MG tablet Take 1 tablet (20 mg total) by mouth 3 (three) times daily as needed. For severe anxiety attacks 09/03/19  Yes Eappen, Ria Clock, MD  rizatriptan (MAXALT-MLT) 10 MG disintegrating tablet Take 1 tablet (10 mg total) by mouth as needed for migraine. May repeat in 2 hours if needed 12/11/18  Yes Melvenia Beam, MD  rOPINIRole (REQUIP) 0.25 MG tablet Take 1 tablet (0.25 mg total) by mouth at bedtime. 10/23/19  Yes Ursula Alert, MD  topiramate (TOPAMAX) 100 MG tablet Take 1 tablet by mouth once daily 09/04/19  Yes Baity, Coralie Keens, NP  venlafaxine XR (EFFEXOR-XR) 150 MG 24 hr capsule Take 1 capsule (150 mg total) by mouth daily with breakfast. 01/22/19  Yes Sarina Ill  B, MD    Past Medical, Surgical Family and Social History reviewed and updated.    Objective:   Today's Vitals   11/28/19 1910  BP: 110/62  Pulse: 98  Temp: 98.8 F (37.1 C)  SpO2: 98%  Weight: 135 lb (61.2 kg)    Wt Readings from Last 3 Encounters:  11/28/19 135 lb (61.2 kg)  11/16/19 134 lb (60.8 kg)  11/05/19 129 lb (58.5 kg)     Physical Exam Constitutional:      Appearance: Normal appearance.   Neurological:     Mental Status: She is alert.  Psychiatric:        Attention and Perception: She is inattentive.        Mood and Affect: Affect is flat.        Speech: Speech normal.      Assessment & Plan:  1. Generalized body aches -Suspect this is related to patient's underlying chronic pain condition.  Patient is nontoxic nonill appearing although has a pronounced flat affect during today's visit.  Offered meloxicam and cyclobenzaprine however patient declined as she states these medications do not work for her.   Patient is already followed by physical medicine and has a primary care provider therefore will refer back to them for any additional management related to patient's chronic pain.  2. Other fatigue -Encouraged patient to develop a better sleep hygiene and sleep routine as I suspect fatigue is mostly related to insomnia and also complicated by her underlying bipolar disorder.  Will defer back to primary care to manage along with [facility].  3. Bipolar 1 disorder, mixed, mild (HCC) -See #1 #2  4. Other chronic pain -See #1  5. Tobacco dependence -Encouraged cessation   6. Shortness of breath  -Continue Albuterol inhaler -Lung exam unremarkable therefore no indication for chest- x-ray today -Pt is negative for any other respiratory symptoms on exam today therefore no other medication prescribed.  -Recommended Zinc 50 mg for cough and wheezing if symptoms reoccur -Recommended vitamin D to improve fatigue -Recommended vitamin C good in preventing or limiting severity of respiratory symptoms    Joaquin Courts, FNP-C [facility], PRN Provider  [facility]. [place], [place]  Clinic Phone: [phone] Clinic Fax: [phone] Clinic Hours: 5:30 pm -7:30 pm (Monday, Wednesday, and Friday)

## 2019-11-28 NOTE — Telephone Encounter (Signed)
error 

## 2019-12-03 ENCOUNTER — Encounter: Payer: Commercial Managed Care - PPO | Admitting: Physical Medicine and Rehabilitation

## 2019-12-21 ENCOUNTER — Ambulatory Visit: Payer: Commercial Managed Care - PPO | Admitting: Physical Medicine and Rehabilitation

## 2019-12-25 ENCOUNTER — Other Ambulatory Visit: Payer: Self-pay

## 2019-12-25 ENCOUNTER — Encounter: Payer: Self-pay | Admitting: Physical Medicine and Rehabilitation

## 2019-12-25 ENCOUNTER — Telehealth (INDEPENDENT_AMBULATORY_CARE_PROVIDER_SITE_OTHER): Payer: Commercial Managed Care - PPO | Admitting: Internal Medicine

## 2019-12-25 ENCOUNTER — Encounter
Payer: Commercial Managed Care - PPO | Attending: Physical Medicine & Rehabilitation | Admitting: Physical Medicine and Rehabilitation

## 2019-12-25 ENCOUNTER — Other Ambulatory Visit: Payer: Self-pay | Admitting: Internal Medicine

## 2019-12-25 ENCOUNTER — Other Ambulatory Visit: Payer: Self-pay | Admitting: Psychiatry

## 2019-12-25 ENCOUNTER — Encounter: Payer: Self-pay | Admitting: Internal Medicine

## 2019-12-25 VITALS — Temp 97.7°F

## 2019-12-25 VITALS — BP 112/75 | HR 92 | Temp 97.7°F | Ht 61.0 in | Wt 134.0 lb

## 2019-12-25 DIAGNOSIS — B9689 Other specified bacterial agents as the cause of diseases classified elsewhere: Secondary | ICD-10-CM | POA: Diagnosis not present

## 2019-12-25 DIAGNOSIS — M25512 Pain in left shoulder: Secondary | ICD-10-CM | POA: Insufficient documentation

## 2019-12-25 DIAGNOSIS — G8929 Other chronic pain: Secondary | ICD-10-CM | POA: Diagnosis present

## 2019-12-25 DIAGNOSIS — J019 Acute sinusitis, unspecified: Secondary | ICD-10-CM | POA: Diagnosis not present

## 2019-12-25 DIAGNOSIS — M7592 Shoulder lesion, unspecified, left shoulder: Secondary | ICD-10-CM | POA: Diagnosis not present

## 2019-12-25 DIAGNOSIS — M7918 Myalgia, other site: Secondary | ICD-10-CM | POA: Diagnosis present

## 2019-12-25 DIAGNOSIS — F3181 Bipolar II disorder: Secondary | ICD-10-CM

## 2019-12-25 DIAGNOSIS — G4701 Insomnia due to medical condition: Secondary | ICD-10-CM | POA: Diagnosis not present

## 2019-12-25 MED ORDER — AMITRIPTYLINE HCL 10 MG PO TABS: 10.0000 mg | ORAL_TABLET | Freq: Every day | ORAL | 3 refills | Status: DC

## 2019-12-25 MED ORDER — GABAPENTIN 300 MG PO CAPS: 300.0000 mg | ORAL_CAPSULE | Freq: Three times a day (TID) | ORAL | 3 refills | Status: DC

## 2019-12-25 MED ORDER — GABAPENTIN 400 MG PO CAPS: 400.0000 mg | ORAL_CAPSULE | Freq: Three times a day (TID) | ORAL | 3 refills | Status: DC

## 2019-12-25 MED ORDER — IBUPROFEN 800 MG PO TABS: 800.0000 mg | ORAL_TABLET | Freq: Three times a day (TID) | ORAL | 3 refills | Status: DC | PRN

## 2019-12-25 MED ORDER — AMOXICILLIN-POT CLAVULANATE 875-125 MG PO TABS: 1.0000 | ORAL_TABLET | Freq: Two times a day (BID) | ORAL | 0 refills | Status: DC
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
## 2019-12-25 NOTE — Patient Instructions (Signed)

## 2019-12-25 NOTE — Progress Notes (Signed)
Virtual Visit via Video Note  I connected with Lindsay Nicholson on 12/25/19 at  4:00 PM EDT by a video enabled telemedicine application and verified that I am speaking with the correct person using two identifiers.  Location: Patient: In her car Provider: Office   I discussed the limitations of evaluation and management by telemedicine and the availability of in person appointments. The patient expressed understanding and agreed to proceed.  History of Present Illness:  Pt reports nasal congestion and cough. This started 1-2 weeks ago. She is blowing green mucous out of her nose. The cough is dry and nonproductive. She has frequent headaches, itchy watery eyes. She denies vision changes, ear pain, sore throat, loss of taste or smell or SOB. She denies fever, chills or body aches. She has tried Zyrtec and Tylenol OTC with minimal relief. She has not had sick contacts or exposure to Covid that she is aware of.   Past Medical History:  Diagnosis Date  . Anxiety   . Cervicalgia   . SVD (spontaneous vaginal delivery) 09/14/2011    Current Outpatient Medications  Medication Sig Dispense Refill  . albuterol (VENTOLIN HFA) 108 (90 Base) MCG/ACT inhaler     . ARIPiprazole (ABILIFY) 2 MG tablet Take 1 tablet (2 mg total) by mouth daily. 30 tablet 1  . lamoTRIgine (LAMICTAL) 150 MG tablet Take 1 tablet (150 mg total) by mouth daily. 90 tablet 0  . medroxyPROGESTERone Acetate 150 MG/ML SUSY     . pregabalin (LYRICA) 50 MG capsule Take 1 capsule (50 mg total) by mouth 3 (three) times daily. 90 capsule 1  . propranolol (INDERAL) 20 MG tablet Take 1 tablet (20 mg total) by mouth 3 (three) times daily as needed. For severe anxiety attacks 90 tablet 1  . rizatriptan (MAXALT-MLT) 10 MG disintegrating tablet Take 1 tablet (10 mg total) by mouth as needed for migraine. May repeat in 2 hours if needed 9 tablet 11  . rOPINIRole (REQUIP) 0.25 MG tablet Take 1 tablet (0.25 mg total) by mouth at bedtime. 30 tablet 1   . topiramate (TOPAMAX) 100 MG tablet Take 1 tablet by mouth once daily 30 tablet 2  . venlafaxine XR (EFFEXOR-XR) 150 MG 24 hr capsule Take 1 capsule (150 mg total) by mouth daily with breakfast. 90 capsule 11   No current facility-administered medications for this visit.    No Known Allergies  Family History  Problem Relation Age of Onset  . Diabetes Father   . Hypertension Father   . High Cholesterol Father   . Anesthesia problems Neg Hx   . Cancer Neg Hx   . Heart disease Neg Hx   . Stroke Neg Hx   . Migraines Neg Hx     Social History   Socioeconomic History  . Marital status: Single    Spouse name: Not on file  . Number of children: 3  . Years of education: Not on file  . Highest education level: Associate degree: occupational, Scientist, product/process development, or vocational program  Occupational History  . Not on file  Tobacco Use  . Smoking status: Current Every Day Smoker    Packs/day: 0.25    Types: Cigarettes  . Smokeless tobacco: Never Used  Substance and Sexual Activity  . Alcohol use: Yes    Alcohol/week: 0.0 standard drinks    Comment: occasional  . Drug use: No  . Sexual activity: Yes    Birth control/protection: Injection  Other Topics Concern  . Not on file  Social History  Narrative   Lives at home with her fiance and her children   Left handed   Caffeine: all day long   Social Determinants of Health   Financial Resource Strain:   . Difficulty of Paying Living Expenses:   Food Insecurity:   . Worried About Charity fundraiser in the Last Year:   . Arboriculturist in the Last Year:   Transportation Needs:   . Film/video editor (Medical):   Marland Kitchen Lack of Transportation (Non-Medical):   Physical Activity:   . Days of Exercise per Week:   . Minutes of Exercise per Session:   Stress:   . Feeling of Stress :   Social Connections:   . Frequency of Communication with Friends and Family:   . Frequency of Social Gatherings with Friends and Family:   . Attends  Religious Services:   . Active Member of Clubs or Organizations:   . Attends Archivist Meetings:   Marland Kitchen Marital Status:   Intimate Partner Violence:   . Fear of Current or Ex-Partner:   . Emotionally Abused:   Marland Kitchen Physically Abused:   . Sexually Abused:      Constitutional: Denies fever, malaise, fatigue, headache or abrupt weight changes.  HEENT: pt reports nasal congestion. Denies eye pain, eye redness, ear pain, ringing in the ears, wax buildup, runny nose, bloody nose, or sore throat. Respiratory: Pt reports cough. Denies difficulty breathing, shortness of breath.   Cardiovascular: Denies chest pain, chest tightness, palpitations or swelling in the hands or feet.    No other specific complaints in a complete review of systems (except as listed in HPI above).  Observations/Objective: Temp 97.7 F (36.5 C) (Oral)   Wt Readings from Last 3 Encounters:  11/28/19 135 lb (61.2 kg)  11/16/19 134 lb (60.8 kg)  11/05/19 129 lb (58.5 kg)    General: Appears her stated age,  in NAD. HEENT: Head: normal shape and size; Nose: congestion noted ; Throat/Mouth: no hoarseness noted Pulmonary/Chest: Normal effort. No respiratory distress.  Neurological: Alert and oriented.   BMET    Component Value Date/Time   NA 142 08/27/2019 1225   K 3.6 08/27/2019 1225   CL 110 08/27/2019 1225   CO2 23 08/27/2019 1225   GLUCOSE 90 08/27/2019 1225   BUN 13 08/27/2019 1225   CREATININE 1.01 08/27/2019 1225   CALCIUM 9.6 08/27/2019 1225   GFRNONAA >60 11/16/2015 0832   GFRAA >60 11/16/2015 0832    Lipid Panel     Component Value Date/Time   CHOL 176 08/27/2019 1225   TRIG 197.0 (H) 08/27/2019 1225   HDL 38.40 (L) 08/27/2019 1225   CHOLHDL 5 08/27/2019 1225   VLDL 39.4 08/27/2019 1225   LDLCALC 98 08/27/2019 1225    CBC    Component Value Date/Time   WBC 7.2 02/06/2019 1602   RBC 4.22 02/06/2019 1602   HGB 13.4 02/06/2019 1602   HCT 39.3 02/06/2019 1602   PLT 269.0  02/06/2019 1602   MCV 93.2 02/06/2019 1602   MCH 30.5 11/16/2015 0832   MCHC 34.1 02/06/2019 1602   RDW 15.1 02/06/2019 1602   LYMPHSABS 0.9 11/16/2015 0832   MONOABS 0.8 11/16/2015 0832   EOSABS 0.0 11/16/2015 0832   BASOSABS 0.0 11/16/2015 0832    Hgb A1C No results found for: HGBA1C     Assessment and Plan:  Acute Bacterial Sinusitis:  Start Flonase OTC Continue Zyrtec RX for Augmentin 875-125 mg BID x 10 days  Return precautions discussed Follow Up Instructions:    I discussed the assessment and treatment plan with the patient. The patient was provided an opportunity to ask questions and all were answered. The patient agreed with the plan and demonstrated an understanding of the instructions.   The patient was advised to call back or seek an in-person evaluation if the symptoms worsen or if the condition fails to improve as anticipated.     Nicki Reaper, NP

## 2019-12-26 ENCOUNTER — Encounter: Payer: Self-pay | Admitting: Internal Medicine

## 2019-12-26 NOTE — Telephone Encounter (Signed)
Will not refill Lamictal since it was sent out on 11/26/2019 for 90-day supply

## 2019-12-28 ENCOUNTER — Encounter: Payer: Self-pay | Admitting: Internal Medicine

## 2020-01-02 ENCOUNTER — Telehealth: Payer: Self-pay

## 2020-01-02 ENCOUNTER — Encounter: Payer: Self-pay | Admitting: Internal Medicine

## 2020-01-02 NOTE — Telephone Encounter (Signed)
Patient called requesting a letter for her 2 dogs to be used as service animals. Please review and advise. Thank you.

## 2020-01-03 NOTE — Telephone Encounter (Signed)
She needs to send [place] her service animal registration information and we can give a letter after reviewing that.

## 2020-01-08 NOTE — Telephone Encounter (Signed)
Relayed message to patient

## 2020-01-08 NOTE — Telephone Encounter (Signed)
LVM

## 2020-01-09 ENCOUNTER — Telehealth (HOSPITAL_COMMUNITY): Payer: Commercial Managed Care - PPO | Admitting: Clinical

## 2020-01-10 ENCOUNTER — Other Ambulatory Visit: Payer: Self-pay

## 2020-01-10 ENCOUNTER — Encounter: Payer: Self-pay | Admitting: Psychiatry

## 2020-01-10 ENCOUNTER — Telehealth (INDEPENDENT_AMBULATORY_CARE_PROVIDER_SITE_OTHER): Payer: Commercial Managed Care - PPO | Admitting: Psychiatry

## 2020-01-10 DIAGNOSIS — F17200 Nicotine dependence, unspecified, uncomplicated: Secondary | ICD-10-CM

## 2020-01-10 DIAGNOSIS — F3161 Bipolar disorder, current episode mixed, mild: Secondary | ICD-10-CM | POA: Diagnosis not present

## 2020-01-10 DIAGNOSIS — F410 Panic disorder [episodic paroxysmal anxiety] without agoraphobia: Secondary | ICD-10-CM | POA: Diagnosis not present

## 2020-01-10 DIAGNOSIS — F424 Excoriation (skin-picking) disorder: Secondary | ICD-10-CM | POA: Diagnosis not present

## 2020-01-10 DIAGNOSIS — F5105 Insomnia due to other mental disorder: Secondary | ICD-10-CM | POA: Diagnosis not present

## 2020-01-10 MED ORDER — ARIPIPRAZOLE 5 MG PO TABS: 5.0000 mg | ORAL_TABLET | Freq: Every day | ORAL | 1 refills | Status: DC

## 2020-01-10 MED ORDER — ROPINIROLE HCL 0.25 MG PO TABS: 0.2500 mg | ORAL_TABLET | Freq: Every day | ORAL | 1 refills | Status: DC

## 2020-01-10 NOTE — Progress Notes (Signed)
Provider Location : [facility] Patient Location : Work  Virtual Visit via Video Note  I connected with Lindsay Nicholson on 01/10/20 at  9:30 AM EDT by a video enabled telemedicine application and verified that I am speaking with the correct person using two identifiers.   I discussed the limitations of evaluation and management by telemedicine and the availability of in person appointments. The patient expressed understanding and agreed to proceed.    I discussed the assessment and treatment plan with the patient. The patient was provided an opportunity to ask questions and all were answered. The patient agreed with the plan and demonstrated an understanding of the instructions.   The patient was advised to call back or seek an in-person evaluation if the symptoms worsen or if the condition fails to improve as anticipated.   [facility] MD OP Progress Note  01/10/2020 6:26 PM Lakeva Hollon  MRN:  [id number]  Chief Complaint:  Chief Complaint    Follow-up     HPI: Lindsay Nicholson is a 34 year old Hispanic female, lives in [place], has a history of bipolar disorder, panic attacks, skin picking disorder, tobacco use disorder was evaluated by telemedicine today.  Patient today reports she continues to struggle with mood lability.  She reports she feels that, has lack of motivation and has problems with concentration.  She also has been having racing thoughts.  She reports she has been having trouble focusing at work due to the same.  She is currently back at work in the office.  Previously she was working from home.  She reports sleep may have improved since being on the restless leg symptom medication.  Patient reports she continues to follow-up with her neurologist for her headaches and continues to struggle with arm pain as well as headaches.  Patient denies any suicidality, homicidality.  She does report visual hallucinations of seeing shadows on and off.  She reports the last time she saw it was a while  ago.  She continues to follow-up with her therapist.  Patient denies any other concerns today. Visit Diagnosis:    ICD-10-CM   1. Bipolar 1 disorder, mixed, mild (HCC)  F31.61 ARIPiprazole (ABILIFY) 5 MG tablet  2. Panic attacks  F41.0   3. Insomnia due to mental disorder  F51.05 rOPINIRole (REQUIP) 0.25 MG tablet  4. Skin-picking disorder  F42.4   5. Tobacco use disorder  F17.200     Past Psychiatric History: I have reviewed past psychiatric history from my progress note on 03/15/2019.  Past trials of Wellbutrin, BuSpar, hydroxyzine, Effexor  Past Medical History:  Past Medical History:  Diagnosis Date  . Anxiety   . Cervicalgia   . SVD (spontaneous vaginal delivery) 09/14/2011    Past Surgical History:  Procedure Laterality Date  . LAPAROSCOPIC APPENDECTOMY N/A 11/16/2015   Procedure: APPENDECTOMY LAPAROSCOPIC;  Surgeon: Ovidio Kin, MD;  Location: [facility];  Service: General;  Laterality: N/A;  . SHOULDER FUSION SURGERY      Family Psychiatric History: I have reviewed family psychiatric history from my progress note on 03/15/2019.  Family History:  Family History  Problem Relation Age of Onset  . Diabetes Father   . Hypertension Father   . High Cholesterol Father   . Anesthesia problems Neg Hx   . Cancer Neg Hx   . Heart disease Neg Hx   . Stroke Neg Hx   . Migraines Neg Hx     Social History: I have reviewed social history from my progress note on 03/15/2019. Social History  Socioeconomic History  . Marital status: Single    Spouse name: Not on file  . Number of children: 3  . Years of education: Not on file  . Highest education level: Associate degree: occupational, Scientist, product/process development, or vocational program  Occupational History  . Not on file  Tobacco Use  . Smoking status: Current Every Day Smoker    Packs/day: 0.25    Types: Cigarettes  . Smokeless tobacco: Never Used  Substance and Sexual Activity  . Alcohol use: Yes    Alcohol/week: 0.0 standard drinks     Comment: occasional  . Drug use: No  . Sexual activity: Yes    Birth control/protection: Injection  Other Topics Concern  . Not on file  Social History Narrative   Lives at home with her fiance and her children   Left handed   Caffeine: all day long   Social Determinants of Health   Financial Resource Strain:   . Difficulty of Paying Living Expenses:   Food Insecurity:   . Worried About Programme researcher, broadcasting/film/video in the Last Year:   . Barista in the Last Year:   Transportation Needs:   . Freight forwarder (Medical):   Marland Kitchen Lack of Transportation (Non-Medical):   Physical Activity:   . Days of Exercise per Week:   . Minutes of Exercise per Session:   Stress:   . Feeling of Stress :   Social Connections:   . Frequency of Communication with Friends and Family:   . Frequency of Social Gatherings with Friends and Family:   . Attends Religious Services:   . Active Member of Clubs or Organizations:   . Attends Banker Meetings:   Marland Kitchen Marital Status:     Allergies: No Known Allergies  Metabolic Disorder Labs: No results found for: HGBA1C, MPG No results found for: PROLACTIN Lab Results  Component Value Date   CHOL 176 08/27/2019   TRIG 197.0 (H) 08/27/2019   HDL 38.40 (L) 08/27/2019   CHOLHDL 5 08/27/2019   VLDL 39.4 08/27/2019   LDLCALC 98 08/27/2019   LDLCALC 97 08/08/2017   Lab Results  Component Value Date   TSH 0.93 02/06/2019   TSH 1.02 12/03/2014    Therapeutic Level Labs: No results found for: LITHIUM No results found for: VALPROATE No components found for:  CBMZ  Current Medications: Current Outpatient Medications  Medication Sig Dispense Refill  . albuterol (VENTOLIN HFA) 108 (90 Base) MCG/ACT inhaler     . amitriptyline (ELAVIL) 10 MG tablet Take 1 tablet (10 mg total) by mouth at bedtime. 30 tablet 3  . amoxicillin-clavulanate (AUGMENTIN) 875-125 MG tablet Take 1 tablet by mouth 2 (two) times daily. 20 tablet 0  . ARIPiprazole  (ABILIFY) 5 MG tablet Take 1 tablet (5 mg total) by mouth daily. 30 tablet 1  . gabapentin (NEURONTIN) 400 MG capsule Take 1 capsule (400 mg total) by mouth 3 (three) times daily. 180 capsule 3  . ibuprofen (ADVIL) 800 MG tablet Take 1 tablet (800 mg total) by mouth every 8 (eight) hours as needed. 90 tablet 3  . JENCYCLA 0.35 MG tablet Take 1 tablet by mouth daily.    Marland Kitchen lamoTRIgine (LAMICTAL) 150 MG tablet Take 1 tablet (150 mg total) by mouth daily. 90 tablet 0  . medroxyPROGESTERone Acetate 150 MG/ML SUSY     . pregabalin (LYRICA) 50 MG capsule Take 1 capsule (50 mg total) by mouth 3 (three) times daily. (Patient not taking: Reported on 01/10/2020)  90 capsule 1  . propranolol (INDERAL) 20 MG tablet Take 1 tablet (20 mg total) by mouth 3 (three) times daily as needed. For severe anxiety attacks 90 tablet 1  . rizatriptan (MAXALT-MLT) 10 MG disintegrating tablet Take 1 tablet (10 mg total) by mouth as needed for migraine. May repeat in 2 hours if needed 9 tablet 11  . rOPINIRole (REQUIP) 0.25 MG tablet Take 1 tablet (0.25 mg total) by mouth at bedtime. 30 tablet 1  . topiramate (TOPAMAX) 100 MG tablet Take 1 tablet by mouth once daily 30 tablet 2  . venlafaxine XR (EFFEXOR-XR) 150 MG 24 hr capsule Take 1 capsule (150 mg total) by mouth daily with breakfast. 90 capsule 11   No current facility-administered medications for this visit.     Musculoskeletal: Strength & Muscle Tone: UTA Gait & Station: normal Patient leans: N/A  Psychiatric Specialty Exam: Review of Systems  Musculoskeletal: Positive for myalgias.       Arm pain  Neurological: Positive for headaches.  Psychiatric/Behavioral: Positive for decreased concentration and dysphoric mood.  All other systems reviewed and are negative.   There were no vitals taken for this visit.There is no height or weight on file to calculate BMI.  General Appearance: Casual  Eye Contact:  Fair  Speech:  Clear and Coherent  Volume:  Normal   Mood:  Depressed  Affect:  Congruent  Thought Process:  Goal Directed and Descriptions of Associations: Intact  Orientation:  Full (Time, Place, and Person)  Thought Content: Rumination   Suicidal Thoughts:  No  Homicidal Thoughts:  No  Memory:  Immediate;   Fair Recent;   Fair Remote;   Fair  Judgement:  Fair  Insight:  Fair  Psychomotor Activity:  Normal  Concentration:  Concentration: Fair and Attention Span: Fair  Recall:  [employer] of Knowledge: Fair  Language: Fair  Akathisia:  No  Handed:  Right  AIMS (if indicated): UTA  Assets:  Communication Skills Desire for [place] Talents/Skills Transportation Vocational/Educational  ADL's:  Intact  Cognition: WNL  Sleep:  Improving   Screenings: PHQ2-9     Office Visit from 08/27/2019 in [place] at [facility] Visit from 08/21/2019 in [place] Visit from 08/09/2018 in [place] at [facility] Visit from 03/02/2018 in [place] at [facility] Visit from 08/08/2017 in [place] at [facility] Total Score  1  2  0  0  0  PHQ-9 Total Score  8  7  0  --  --       Assessment and Plan: Lindsay Nicholson is a 34 year old Hispanic female, employed, lives in [place], has a history of bipolar disorder, panic attacks, skin picking disorder was evaluated by telemedicine today.  Patient is biologically predisposed given her history of trauma.  Patient with psychosocial stressors of the current pandemic, her medical problems and pain continues to struggle with mood lability.  Patient will continue to benefit from medication readjustment and psychotherapy sessions.  Plan as noted below.  Plan Bipolar disorder-unstable Lamotrigine 150 mg p.o. daily divided dosage Increase Abilify to 5 mg p.o. daily Continue venlafaxine-prescribed by neurology for headaches  Panic attacks-improving Propranolol  20 mg p.o. 3 times daily as needed Patient to continue psychotherapy sessions with Mr. Eulas Post.  Patient was previously advised to attend IOP or PHP however she has been noncompliant  Skin picking disorder-improving Continue CBT  Insomnia-improving Patient currently has  amitriptyline prescribed for pain which she uses as needed. Requip 0.25 mg p.o. nightly as needed for restless leg symptoms.  Tobacco use disorder-improving Patient provided counseling.  Follow-up in clinic in 3 weeks or sooner if needed.  I have spent atleast 20 minutes non face to face with patient today. More than 50 % of the time was spent for preparing to see the patient ( e.g., review of test, records ),  ordering medications and test ,psychoeducation and supportive psychotherapy and care coordination,as well as documenting clinical information in electronic health record. This note was generated in part or whole with voice recognition software. Voice recognition is usually quite accurate but there are transcription errors that can and very often do occur. I apologize for any typographical errors that were not detected and corrected.      Jomarie Longs, MD 01/10/2020, 6:26 PM

## 2020-01-22 ENCOUNTER — Ambulatory Visit (INDEPENDENT_AMBULATORY_CARE_PROVIDER_SITE_OTHER): Payer: Commercial Managed Care - PPO | Admitting: Clinical

## 2020-01-22 ENCOUNTER — Other Ambulatory Visit: Payer: Self-pay

## 2020-01-22 DIAGNOSIS — F410 Panic disorder [episodic paroxysmal anxiety] without agoraphobia: Secondary | ICD-10-CM | POA: Diagnosis not present

## 2020-01-22 DIAGNOSIS — F3161 Bipolar disorder, current episode mixed, mild: Secondary | ICD-10-CM

## 2020-01-22 DIAGNOSIS — F5105 Insomnia due to other mental disorder: Secondary | ICD-10-CM | POA: Diagnosis not present

## 2020-01-22 NOTE — Progress Notes (Signed)
   Virtual Visit via Video Note  I connected withPhally Nicholson on 01/22/20 at 1:00 PM EST by a video enabled telemedicine application and verified that I am speaking with the correct person using two identifiers.  Location: Patient: Home Provider: Office  I discussed the limitations of evaluation and management by telemedicine and the availability of in person appointments. The patient expressed understanding and agreed to proceed.      THERAPIST PROGRESS NOTE  Session Time: 1:15 PM-1:55PM  Participation Level: Active  Behavioral Response: CasualAlertAngry and Anxious  Type of Therapy: Individual Therapy  Treatment Goals addressed: Coping  Interventions: CBT  Summary: Lindsay Nicholson is a 34 y.o. female who presents with Bipolar Disorder. The OPT therapist worked with the patient  for her ongoing OPT treatment. The OPT therapist utilized Motivational Interviewing to assist in creating therapeutic repore. The patient in the session was engaged and work in collaboration giving feedback about her triggers and symptoms over the past few weeks including work stress and family interactions. The OPT therapist utilized Cognitive Behavioral Therapy through cognitive restructuring as well as worked with the patient on coping strategies to assist in management of Anxiety and Irritability including taking walks and listening to music. The OPT therapist inquired for holistic care about the patients adherence to medication therapy.  Suicidal/Homicidal: Yeswithout intent/plan  Therapist Response: The OPT therapist worked with the patient for the patients scheduled session. The patient was engaged in her session and gave feedback in relation to triggers, symptoms, and behavior responses over the past few weeks. The OPT therapist worked with the patient utilizing an in session Cognitive Behavioral Therapy exercise. The patient was responsive in the session and verbalized, " I am willing to  work on using coping skills and talking a break/walk when needed". The patient indicated both compliance and effectiveness in relation to her current medication therapy . The OPT therapist will continue treatment work with the patient in her next scheduled session.  Plan: Return again in 2 weeks.  Diagnosis:      Axis I: Bipolar 1 disorder, mixed, mild , Panic Attacks, and Insomnia due to mental disorder                          Axis II: No diagnosis  I discussed the assessment and treatment plan with the patient. The patient was provided an opportunity to ask questions and all were answered. The patient agreed with the plan and demonstrated an understanding of the instructions.  The patient was advised to call back or seek an in-person evaluation if the symptoms worsen or if the condition fails to improve as anticipated.  I provided 40 minutes of non-face-to-face time during this encounter.  Lindsay Burn, LCSW 01/22/2020

## 2020-01-31 ENCOUNTER — Other Ambulatory Visit: Payer: Self-pay | Admitting: Neurology

## 2020-02-04 ENCOUNTER — Encounter: Payer: Self-pay | Admitting: Psychiatry

## 2020-02-04 ENCOUNTER — Telehealth (INDEPENDENT_AMBULATORY_CARE_PROVIDER_SITE_OTHER): Payer: Commercial Managed Care - PPO | Admitting: Psychiatry

## 2020-02-04 ENCOUNTER — Other Ambulatory Visit: Payer: Self-pay

## 2020-02-04 DIAGNOSIS — F3160 Bipolar disorder, current episode mixed, unspecified: Secondary | ICD-10-CM

## 2020-02-04 DIAGNOSIS — F410 Panic disorder [episodic paroxysmal anxiety] without agoraphobia: Secondary | ICD-10-CM | POA: Diagnosis not present

## 2020-02-04 DIAGNOSIS — F5105 Insomnia due to other mental disorder: Secondary | ICD-10-CM | POA: Diagnosis not present

## 2020-02-04 DIAGNOSIS — F424 Excoriation (skin-picking) disorder: Secondary | ICD-10-CM

## 2020-02-04 DIAGNOSIS — F17200 Nicotine dependence, unspecified, uncomplicated: Secondary | ICD-10-CM

## 2020-02-04 MED ORDER — LAMOTRIGINE 150 MG PO TABS: 150.0000 mg | ORAL_TABLET | Freq: Every day | ORAL | 0 refills | Status: DC

## 2020-02-04 MED ORDER — ARIPIPRAZOLE 10 MG PO TABS: 10.0000 mg | ORAL_TABLET | Freq: Every day | ORAL | 1 refills | Status: DC

## 2020-02-04 MED ORDER — PROPRANOLOL HCL 20 MG PO TABS: 20.0000 mg | ORAL_TABLET | Freq: Three times a day (TID) | ORAL | 1 refills | Status: DC | PRN

## 2020-02-04 NOTE — Progress Notes (Signed)
Provider Location : [facility] Patient Location : Work  Virtual Visit via Video Note  I connected with Lindsay Nicholson on 02/04/20 at  9:20 AM EDT by a video enabled telemedicine application and verified that I am speaking with the correct person using two identifiers.   I discussed the limitations of evaluation and management by telemedicine and the availability of in person appointments. The patient expressed understanding and agreed to proceed.    I discussed the assessment and treatment plan with the patient. The patient was provided an opportunity to ask questions and all were answered. The patient agreed with the plan and demonstrated an understanding of the instructions.   The patient was advised to call back or seek an in-person evaluation if the symptoms worsen or if the condition fails to improve as anticipated.  BH MD OP Progress Note  02/04/2020 10:30 AM Lindsay Nicholson  MRN:  [id number]  Chief Complaint:  Chief Complaint    Follow-up     HPI: Lindsay Nicholson is a 34 year old Hispanic female, lives in [place], has a history of bipolar disorder, panic attacks, skin picking disorder, tobacco use disorder was evaluated by telemedicine today.  Patient today reports that she is currently struggling with mood lability.  She reports she is irritable often.  She reports her irritability can be triggered by anything.  She reports she tries to cope with her stressors however has been having a difficult time.  She is taking her Abilify which was recently increased to 5 mg.  She reports she has not noticed much benefit from that dosage.  She denies side effects.  She reports sleep continues to be restless.  She does not have a good sleep hygiene.  She reports she is currently trying to work with her children to follow good sleep hygiene.  She goes to bed at around 11 PM or midnight.  She reports she wakes up around 6 AM since she has to go to work early.    Patient reports she has been having panic  attacks.  She reports she had 2-3 back-to-back panic attacks in February and since then she has been having them every 2 weeks or so.  She has been trying to work on her coping techniques.  She reports recently it was triggered by her looking at her dog's collar.  She reports her dog passed away.  She reports her panic symptoms as having these crying spells.  She reports she locks herself in her bathroom and that seems to help.  She also has propranolol available which she can use as needed.  She has been working with her therapist who is teaching her coping techniques and she is trying to work on them.  Patient continues to have visual hallucinations of seeing shadows often.  She reports it does bother her at times however she tries to distract herself.  Patient continues to follow-up with neurology for her headaches.  Patient denies any suicidality or homicidality.  She denies any auditory hallucinations.  Patient denies any other concerns today.  Visit Diagnosis:    ICD-10-CM   1. Bipolar 1 disorder, mixed (HCC)  F31.60 ARIPiprazole (ABILIFY) 10 MG tablet    lamoTRIgine (LAMICTAL) 150 MG tablet   mild  2. Panic attacks  F41.0 propranolol (INDERAL) 20 MG tablet  3. Insomnia due to mental disorder  F51.05   4. Skin-picking disorder  F42.4   5. Tobacco use disorder  F17.200     Past Psychiatric History: I have reviewed past  psychiatric history from my progress note on 03/15/2019.  Past trials of Wellbutrin, BuSpar, hydroxyzine, Effexor  Past Medical History:  Past Medical History:  Diagnosis Date  . Anxiety   . Cervicalgia   . SVD (spontaneous vaginal delivery) 09/14/2011    Past Surgical History:  Procedure Laterality Date  . LAPAROSCOPIC APPENDECTOMY N/A 11/16/2015   Procedure: APPENDECTOMY LAPAROSCOPIC;  Surgeon: Ovidio Kin, MD;  Location: [facility];  Service: General;  Laterality: N/A;  . SHOULDER FUSION SURGERY      Family Psychiatric History: I have reviewed family psychiatric  history from my progress note on 03/15/2019  Family History:  Family History  Problem Relation Age of Onset  . Diabetes Father   . Hypertension Father   . High Cholesterol Father   . Anesthesia problems Neg Hx   . Cancer Neg Hx   . Heart disease Neg Hx   . Stroke Neg Hx   . Migraines Neg Hx     Social History: Reviewed social history from my progress note on 03/15/2019 Social History   Socioeconomic History  . Marital status: Single    Spouse name: Not on file  . Number of children: 3  . Years of education: Not on file  . Highest education level: Associate degree: occupational, Scientist, product/process development, or vocational program  Occupational History  . Not on file  Tobacco Use  . Smoking status: Current Every Day Smoker    Packs/day: 0.25    Types: Cigarettes  . Smokeless tobacco: Never Used  Substance and Sexual Activity  . Alcohol use: Yes    Alcohol/week: 0.0 standard drinks    Comment: occasional  . Drug use: No  . Sexual activity: Yes    Birth control/protection: Injection  Other Topics Concern  . Not on file  Social History Narrative   Lives at home with her fiance and her children   Left handed   Caffeine: all day long   Social Determinants of Health   Financial Resource Strain:   . Difficulty of Paying Living Expenses:   Food Insecurity:   . Worried About Programme researcher, broadcasting/film/video in the Last Year:   . Barista in the Last Year:   Transportation Needs:   . Freight forwarder (Medical):   Marland Kitchen Lack of Transportation (Non-Medical):   Physical Activity:   . Days of Exercise per Week:   . Minutes of Exercise per Session:   Stress:   . Feeling of Stress :   Social Connections:   . Frequency of Communication with Friends and Family:   . Frequency of Social Gatherings with Friends and Family:   . Attends Religious Services:   . Active Member of Clubs or Organizations:   . Attends Banker Meetings:   Marland Kitchen Marital Status:     Allergies: No Known  Allergies  Metabolic Disorder Labs: No results found for: HGBA1C, MPG No results found for: PROLACTIN Lab Results  Component Value Date   CHOL 176 08/27/2019   TRIG 197.0 (H) 08/27/2019   HDL 38.40 (L) 08/27/2019   CHOLHDL 5 08/27/2019   VLDL 39.4 08/27/2019   LDLCALC 98 08/27/2019   LDLCALC 97 08/08/2017   Lab Results  Component Value Date   TSH 0.93 02/06/2019   TSH 1.02 12/03/2014    Therapeutic Level Labs: No results found for: LITHIUM No results found for: VALPROATE No components found for:  CBMZ  Current Medications: Current Outpatient Medications  Medication Sig Dispense Refill  . albuterol (VENTOLIN  HFA) 108 (90 Base) MCG/ACT inhaler     . amitriptyline (ELAVIL) 10 MG tablet Take 1 tablet (10 mg total) by mouth at bedtime. 30 tablet 3  . amoxicillin-clavulanate (AUGMENTIN) 875-125 MG tablet Take 1 tablet by mouth 2 (two) times daily. 20 tablet 0  . ARIPiprazole (ABILIFY) 10 MG tablet Take 1 tablet (10 mg total) by mouth daily. 30 tablet 1  . gabapentin (NEURONTIN) 400 MG capsule Take 1 capsule (400 mg total) by mouth 3 (three) times daily. 180 capsule 3  . ibuprofen (ADVIL) 800 MG tablet Take 1 tablet (800 mg total) by mouth every 8 (eight) hours as needed. 90 tablet 3  . JENCYCLA 0.35 MG tablet Take 1 tablet by mouth daily.    Marland Kitchen lamoTRIgine (LAMICTAL) 150 MG tablet Take 1 tablet (150 mg total) by mouth daily. 90 tablet 0  . medroxyPROGESTERone Acetate 150 MG/ML SUSY     . pregabalin (LYRICA) 50 MG capsule Take 1 capsule (50 mg total) by mouth 3 (three) times daily. (Patient not taking: Reported on 01/10/2020) 90 capsule 1  . propranolol (INDERAL) 20 MG tablet Take 1 tablet (20 mg total) by mouth 3 (three) times daily as needed. For severe anxiety attacks 90 tablet 1  . rizatriptan (MAXALT-MLT) 10 MG disintegrating tablet Take 1 tablet (10 mg total) by mouth as needed for migraine. May repeat in 2 hours if needed 9 tablet 11  . rOPINIRole (REQUIP) 0.25 MG tablet Take 1  tablet (0.25 mg total) by mouth at bedtime. 30 tablet 1  . topiramate (TOPAMAX) 100 MG tablet Take 1 tablet by mouth once daily 30 tablet 2  . venlafaxine XR (EFFEXOR-XR) 150 MG 24 hr capsule TAKE 1 CAPSULE BY MOUTH ONCE DAILY WITH BREAKFAST 90 capsule 0   No current facility-administered medications for this visit.     Musculoskeletal: Strength & Muscle Tone: UTA Gait & Station: normal Patient leans: N/A  Psychiatric Specialty Exam: Review of Systems  Neurological: Positive for headaches.  Psychiatric/Behavioral: Positive for sleep disturbance. The patient is nervous/anxious.        Irritable  All other systems reviewed and are negative.   There were no vitals taken for this visit.There is no height or weight on file to calculate BMI.  General Appearance: Casual  Eye Contact:  Fair  Speech:  Clear and Coherent  Volume:  Normal  Mood:  Anxious and Irritable  Affect:  Congruent  Thought Process:  Goal Directed and Descriptions of Associations: Intact  Orientation:  Full (Time, Place, and Person)  Thought Content: Logical   Suicidal Thoughts:  No  Homicidal Thoughts:  No  Memory:  Immediate;   Fair Recent;   Fair Remote;   Fair  Judgement:  Fair  Insight:  Fair  Psychomotor Activity:  Normal  Concentration:  Concentration: Fair and Attention Span: Fair  Recall:  [employer] of Knowledge: Fair  Language: Fair  Akathisia:  No  Handed:  Right  AIMS (if indicated): UTA  Assets:  Communication Skills Desire for Improvement Housing Social Support  ADL's:  Intact  Cognition: WNL  Sleep:  Restless   Screenings: PHQ2-9     Office Visit from 08/27/2019 in [place] at [facility] Visit from 08/21/2019 in [place] [facility] Office Visit from 08/09/2018 in [place] at [facility] Visit from 03/02/2018 in [place] at [facility] Visit from 08/08/2017 in [place] at [facility]  Total Score  1  2  0  0  0  PHQ-9 Total Score  8  7  0  --  --       Assessment and Plan: Lindsay Nicholson is a 34 year old Hispanic female, employed, lives in [place], has a history of bipolar disorder, panic attacks, skin picking disorder was evaluated by telemedicine today.  Patient is biologically predisposed given her history of trauma.  Patient with psychosocial stressors of the current pandemic, her medical problems and pain continues to struggle with mood lability and concentration issues.  She will continue to benefit from medication management and psychotherapy sessions.  Plan as noted below.  Plan Bipolar disorder-unstable Lamotrigine 150 mg p.o. daily divided dosage Increase Abilify to 10 mg p.o. daily Continue venlafaxine prescribed per neurology for headaches.  Panic attacks-improving Propranolol 20 mg p.o. 3 times daily as needed Continue CBT with Mr. Montez Morita her therapist.  Patient was previously advised to attend IOP or PHP however she has been noncompliant  Skin picking disorder-improving Continue CBT  Insomnia-improving Patient is currently on amitriptyline prescribed for pain which she uses as needed. Requip 0.25 mg p.o. nightly as needed for restless leg symptoms Patient advised to work on her sleep hygiene.  Tobacco use disorder-improving Provided counseling.  Follow-up in clinic in 3 to 4 weeks or sooner if needed.  I have spent atleast 20 minutes non face to face with patient today. More than 50 % of the time was spent for preparing to see the patient ( e.g., review of test, records ), ordering medications and test ,psychoeducation and supportive psychotherapy and care coordination,as well as documenting clinical information in electronic health record. This note was generated in part or whole with voice recognition software. Voice recognition is usually quite accurate but there are transcription errors that can and very often do occur. I apologize for any  typographical errors that were not detected and corrected.        Jomarie Longs, MD 02/04/2020, 10:30 AM

## 2020-02-05 ENCOUNTER — Encounter: Payer: Self-pay | Admitting: Family Medicine

## 2020-02-05 ENCOUNTER — Telehealth (INDEPENDENT_AMBULATORY_CARE_PROVIDER_SITE_OTHER): Payer: Commercial Managed Care - PPO | Admitting: Family Medicine

## 2020-02-05 DIAGNOSIS — F329 Major depressive disorder, single episode, unspecified: Secondary | ICD-10-CM | POA: Diagnosis not present

## 2020-02-05 DIAGNOSIS — G43709 Chronic migraine without aura, not intractable, without status migrainosus: Secondary | ICD-10-CM

## 2020-02-05 DIAGNOSIS — F419 Anxiety disorder, unspecified: Secondary | ICD-10-CM

## 2020-02-05 DIAGNOSIS — F32A Depression, unspecified: Secondary | ICD-10-CM

## 2020-02-05 NOTE — Progress Notes (Addendum)
PATIENT: Lindsay Nicholson DOB: [birth year]  REASON FOR VISIT: follow up HISTORY FROM: patient  Virtual Visit via Telephone Note  I connected with Lindsay Nicholson on 02/05/20 at 11:00 AM EDT by telephone and verified that I am speaking with the correct person using two identifiers.   I discussed the limitations, risks, security and privacy concerns of performing an evaluation and management service by telephone and the availability of in person appointments. I also discussed with the patient that there may be a patient responsible charge related to this service. The patient expressed understanding and agreed to proceed.   History of Present Illness:  02/05/20 Lindsay Nicholson is a 34 y.o. female here today for follow up for migraines. She reports doing well. She may have 3-4 migraines per month. She can't remember the last time she had to use rizatriptan but feels it works well when needed. She continues close follow-up with her primary care provider and psychiatrist for management of anxiety and depression.  She is taking topiramate 100 mg daily, amitriptyline 10 mg at bedtime and gabapentin 400 mg up to 3 times daily as needed, prescribed by primary care.  Psychiatry is prescribing propranolol 20 mg 3 times daily and lamotrigine 150 mg daily.  She is also taking Abilify 10 mg daily.  She reports that psychiatry is interested in weaning her off of venlafaxine in order to try different medications to treat anxiety and depression.  She is feeling well today and without concerns.   History (copied from Dr Cathren Laine note on 03/19/2019)  Interval history March 19, 2019: She is here for follow up for migraines. She is on effexor and feeling better. She is on Effexor 150 and the topamax. Psychiatry has prescribed propranolol for anxiety which is also a good for migraines. She has 4-5 days of migraines a month which is improved. Maxalt helps acutely. Zanaflex, flexeril and baclofen did not help. The neck pain is a separate  issue and happens more frequently than the migraines. Starts in the left shoulder area, and radiates up the back of the head. Sharp pain more in the left cervical area and to the neck and the occipital area. Lasts for hours. She has stiffness and shooting pain. She went to dry needling and physical therapy which helped. She tries stretching exercises at home. She stretches in the doorway and uses the band. Can be severe, feels her arm is weak. Pin was 10/10. Tried PT, geat, stretching, under the care of a doctor for 2 years, worseninf Lindsay Silversmith NP.   Personally reviewed XR images 02/06/2019: IMPRESSION: No fracture or spondylolisthesis. No appreciable arthropathy. Reversal of lordotic curvature is likely indicative of a degree of muscle spasm.  Meds tried: effexor, topamac, propranolol. mobic  HPI:  Lindsay Nicholson is a 34 y.o. female here as requested by Jearld Fenton, NP for headaches. Started 8 years ago. Starts in the left back of the head radiates to the shoulder.Also be more left sided (parietal). Shooting sharp pain. It can be severe, she has to stay home, she can't move, pounding and pulsating, nausea and vomited once, light and sound sensitivity. It has lasted 2 weeks but usually 24-72 hours. She has 2 cups of coffee. She has 15 migraine days a month for 6-7 years. They can start in the morning, can be positional, Topamax helps and it was increased to 100mg . Tried Amitriptyline. She has a lot of neck pain, she says it is pretty bad. She was given meloxicam. She is not  sleeping well at night. Her mind races at night. Anxiety is an issue. She sits all day. She stares at a computer all day. She has not had eyes checked. She has face numbness. She has dizziness. No aura.  She has had episodes of spots in her vision, vision loss. No other focal neurologic deficits, associated symptoms, inciting events or modifiable factors.   Observations/Objective:  Generalized: Well developed, in no acute  distress  Mentation: Alert oriented to time, place, history taking. Follows all commands speech and language fluent   Assessment and Plan:  34 y.o. year old female  has a past medical history of Anxiety, Cervicalgia, and SVD (spontaneous vaginal delivery) (09/14/2011). here with    ICD-10-CM   1. Chronic migraine without aura without status migrainosus, not intractable  G43.709   2. Anxiety and depression  F41.9    F32.9     Lindsay Nicholson reports that she is doing well today.  Migraines are well managed on current treatment regimen.  We will continue venlafaxine 150 mg daily and rizatriptan 10 mg as needed.  Psychiatry is interested in weaning venlafaxine in order to try a different antidepressant.  She is on multiple other medications that are frequently used in migraine prevention including topiramate, propranolol, itching, gabapentin and amitriptyline.  I do not have any concerns of psychiatry weaning venlafaxine if indicated.  I have advised the patient to follow weaning recommendations from psychiatry very closely as this medication can be hard to wean off of.  If she is able to wings excessively and doing well, she may follow-up with [place] as needed.  Healthy lifestyle habits encouraged.  She verbalizes understanding and agreement with this plan.  No orders of the defined types were placed in this encounter.   No orders of the defined types were placed in this encounter.    Follow Up Instructions:  I discussed the assessment and treatment plan with the patient. The patient was provided an opportunity to ask questions and all were answered. The patient agreed with the plan and demonstrated an understanding of the instructions.   The patient was advised to call back or seek an in-person evaluation if the symptoms worsen or if the condition fails to improve as anticipated.  I provided 15 minutes of non-face-to-face time during this encounter.  Patient is located in a parked car for our my chart  visit.  Provider is in the office.   Shawnie Dapper, NP   Made any corrections needed, and agree with history, physical, neuro exam,assessment and plan as stated.     Naomie Dean, MD [facility]

## 2020-02-07 ENCOUNTER — Ambulatory Visit (INDEPENDENT_AMBULATORY_CARE_PROVIDER_SITE_OTHER): Payer: Commercial Managed Care - PPO | Admitting: Clinical

## 2020-02-07 ENCOUNTER — Other Ambulatory Visit: Payer: Self-pay

## 2020-02-07 DIAGNOSIS — F5105 Insomnia due to other mental disorder: Secondary | ICD-10-CM | POA: Diagnosis not present

## 2020-02-07 DIAGNOSIS — F410 Panic disorder [episodic paroxysmal anxiety] without agoraphobia: Secondary | ICD-10-CM | POA: Diagnosis not present

## 2020-02-07 DIAGNOSIS — F3160 Bipolar disorder, current episode mixed, unspecified: Secondary | ICD-10-CM | POA: Diagnosis not present
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
## 2020-02-25 ENCOUNTER — Encounter: Payer: Commercial Managed Care - PPO | Admitting: Physical Medicine and Rehabilitation

## 2020-02-25 ENCOUNTER — Encounter: Payer: Self-pay | Admitting: Internal Medicine

## 2020-02-27 ENCOUNTER — Other Ambulatory Visit: Payer: Self-pay

## 2020-02-27 ENCOUNTER — Ambulatory Visit (INDEPENDENT_AMBULATORY_CARE_PROVIDER_SITE_OTHER): Payer: Commercial Managed Care - PPO | Admitting: Clinical

## 2020-02-27 DIAGNOSIS — F3161 Bipolar disorder, current episode mixed, mild: Secondary | ICD-10-CM | POA: Diagnosis not present

## 2020-02-27 DIAGNOSIS — F5105 Insomnia due to other mental disorder: Secondary | ICD-10-CM | POA: Diagnosis not present

## 2020-02-27 DIAGNOSIS — F410 Panic disorder [episodic paroxysmal anxiety] without agoraphobia: Secondary | ICD-10-CM | POA: Diagnosis not present

## 2020-02-27 NOTE — Progress Notes (Signed)
  Virtual Visit via Video Note  I connected withPhally Nicholson 06/16/21at1:00PM ESTby a video enabled telemedicine application and verified that I am speaking with the correct person using two identifiers.  Location: Patient:Home Provider:Office  I discussed the limitations of evaluation and management by telemedicine and the availability of in person appointments. The patient expressed understanding and agreed to proceed.      THERAPIST PROGRESS NOTE  Session Time:1:00 PM-1:30PM  Participation Level:Active  Behavioral Response:CasualAlertAngry and Anxious/Depressed  Type of Therapy:Individual Therapy  Treatment Goals addressed:Coping  Interventions:CBT  Summary:Lindsay Nicholson a 34 y.o.femalewho presents with Bipolar Disorder.The OPT therapist worked with thepatientfor herongoing OPT treatment. The OPT therapist utilized Motivational Interviewing to assist in creating therapeutic repore. The patient in the session was engaged and work in Tour manager about hertriggers and symptoms over the past few weeksincludingwork stress family interactions, and conflict within her relationship with her significant other who she is currently "working things out" with. The OPT therapist utilized Cognitive Behavioral Therapy through cognitive restructuring as well as worked with the patient on coping strategies to assist in management of Anxietyand Irritability. The patient showed signs of willingness to open up and talk more including starting to talk about her desire to make changes to get better for herself and family. The patient did report a positive change with her sleep noting she is sleeping more and this is having a beneficial effect for her energy level. The OPT therapist inquired for holistic care about the patients adherence to medication therapy. The patients has an upcoming medication management appointment on  03/06/2020.  Suicidal/Homicidal:Yeswithout intent/plan  Therapist Response:The OPT therapist worked with the patient for the patients scheduled session. The patient was engaged in hersession and gave feedback in relation to triggers, symptoms, and behavior responses over the pastfewweeks. The OPT therapist worked with the patient utilizing an in session Cognitive Behavioral Therapy exercise. The patient was responsive in the session and verbalized, " I will look to put more leisure into my week to help balance stress". The patient indicated compliance but did not feel effectiveness in relation to her medication therapy and will be reviewing with the prescriber in her upcoming medication therapy appointment.  Plan: Return again in2/3weeks.  Diagnosis:Axis I:Bipolar 1 disorder, mixed, mild, Panic Attacks,and Insomnia due to mental disorder  Axis II:No diagnosis  I discussed the assessment and treatment plan with the patient. The patient was provided an opportunity to ask questions and all were answered. The patient agreed with the plan and demonstrated an understanding of the instructions.  The patient was advised to call back or seek an in-person evaluation if the symptoms worsen or if the condition fails to improve as anticipated.  I provided5minutes of non-face-to-face time during this encounter.  Winfred Burn, LCSW 02/27/2020

## 2020-02-29 ENCOUNTER — Ambulatory Visit: Payer: Commercial Managed Care - PPO | Admitting: Primary Care

## 2020-02-29 ENCOUNTER — Encounter: Payer: Self-pay | Admitting: Primary Care

## 2020-02-29 ENCOUNTER — Ambulatory Visit (INDEPENDENT_AMBULATORY_CARE_PROVIDER_SITE_OTHER): Payer: Commercial Managed Care - PPO | Admitting: Primary Care

## 2020-02-29 ENCOUNTER — Other Ambulatory Visit: Payer: Self-pay

## 2020-02-29 ENCOUNTER — Encounter: Payer: Self-pay | Admitting: Internal Medicine

## 2020-02-29 VITALS — BP 116/80 | HR 88 | Temp 96.1°F | Ht 61.0 in | Wt 134.0 lb

## 2020-02-29 DIAGNOSIS — G8929 Other chronic pain: Secondary | ICD-10-CM

## 2020-02-29 DIAGNOSIS — M7592 Shoulder lesion, unspecified, left shoulder: Secondary | ICD-10-CM

## 2020-02-29 DIAGNOSIS — M25512 Pain in left shoulder: Secondary | ICD-10-CM

## 2020-02-29 DIAGNOSIS — M79602 Pain in left arm: Secondary | ICD-10-CM

## 2020-02-29 MED ORDER — METHOCARBAMOL 750 MG PO TABS: 750.0000 mg | ORAL_TABLET | Freq: Three times a day (TID) | ORAL | 0 refills | Status: DC | PRN

## 2020-02-29 MED ORDER — PREDNISONE 20 MG PO TABS: ORAL_TABLET | ORAL | 0 refills | Status: DC

## 2020-02-29 NOTE — Progress Notes (Signed)
Subjective:    Patient ID: Lindsay Nicholson, female    DOB: Apr 08, 1986, 34 y.o.   MRN: [id number]  HPI  This visit occurred during the SARS-CoV-2 public health emergency.  Safety protocols were in place, including screening questions prior to the visit, additional usage of staff PPE, and extensive cleaning of exam room while observing appropriate contact time as indicated for disinfecting solutions.   Lindsay Nicholson is a 34 year old female patient of Rollene Fare Baity's with a history of myofascial pain syndrome to cervical spine, supraspinatus tendinitis, frequent headaches, bipolar disorder, tobacco abuse who presents today with a chief complaint of shoulder pain.  Her pain is located to the left lateral neck, left scapular region, down through left upper extremity to the mid forearm. Symptoms are chronic, but became worse two days ago. She also endorses intermittent numbness/tingling, weakness which are also chronic.   She's taken Ibuprofen, Tylenol without improvement. She follows with physical medicine, but couldn't get in touch with their office. She's been on oral steroids and muscle relaxer medication in the past, this wasn't very effective.   BP Readings from Last 3 Encounters:  02/29/20 116/80  12/25/19 112/75  11/28/19 110/62     Review of Systems  Musculoskeletal: Positive for arthralgias and myalgias.  Skin: Negative for color change.  Neurological: Positive for weakness and numbness.       Past Medical History:  Diagnosis Date  . Anxiety   . Cervicalgia   . SVD (spontaneous vaginal delivery) 09/14/2011     Social History   Socioeconomic History  . Marital status: Single    Spouse name: Not on file  . Number of children: 3  . Years of education: Not on file  . Highest education level: Associate degree: occupational, Hotel manager, or vocational program  Occupational History  . Not on file  Tobacco Use  . Smoking status: Current Every Day Smoker    Packs/day: 0.25    Types:  Cigarettes  . Smokeless tobacco: Never Used  Vaping Use  . Vaping Use: Never used  Substance and Sexual Activity  . Alcohol use: Yes    Alcohol/week: 0.0 standard drinks    Comment: occasional  . Drug use: No  . Sexual activity: Yes    Birth control/protection: Injection  Other Topics Concern  . Not on file  Social History Narrative   Lives at home with her fiance and her children   Left handed   Caffeine: all day long   Social Determinants of Health   Financial Resource Strain:   . Difficulty of Paying Living Expenses:   Food Insecurity:   . Worried About Charity fundraiser in the Last Year:   . Arboriculturist in the Last Year:   Transportation Needs:   . Film/video editor (Medical):   Marland Kitchen Lack of Transportation (Non-Medical):   Physical Activity:   . Days of Exercise per Week:   . Minutes of Exercise per Session:   Stress:   . Feeling of Stress :   Social Connections:   . Frequency of Communication with Friends and Family:   . Frequency of Social Gatherings with Friends and Family:   . Attends Religious Services:   . Active Member of Clubs or Organizations:   . Attends Archivist Meetings:   Marland Kitchen Marital Status:   Intimate Partner Violence:   . Fear of Current or Ex-Partner:   . Emotionally Abused:   Marland Kitchen Physically Abused:   . Sexually Abused:  Past Surgical History:  Procedure Laterality Date  . LAPAROSCOPIC APPENDECTOMY N/A 11/16/2015   Procedure: APPENDECTOMY LAPAROSCOPIC;  Surgeon: Ovidio Kin, MD;  Location: [facility];  Service: General;  Laterality: N/A;  . SHOULDER FUSION SURGERY      Family History  Problem Relation Age of Onset  . Diabetes Father   . Hypertension Father   . High Cholesterol Father   . Anesthesia problems Neg Hx   . Cancer Neg Hx   . Heart disease Neg Hx   . Stroke Neg Hx   . Migraines Neg Hx     No Known Allergies  Current Outpatient Medications on File Prior to Visit  Medication Sig Dispense Refill  .  albuterol (VENTOLIN HFA) 108 (90 Base) MCG/ACT inhaler     . amitriptyline (ELAVIL) 10 MG tablet Take 1 tablet (10 mg total) by mouth at bedtime. 30 tablet 3  . amoxicillin-clavulanate (AUGMENTIN) 875-125 MG tablet Take 1 tablet by mouth 2 (two) times daily. 20 tablet 0  . ARIPiprazole (ABILIFY) 10 MG tablet Take 1 tablet (10 mg total) by mouth daily. 30 tablet 1  . gabapentin (NEURONTIN) 400 MG capsule Take 1 capsule (400 mg total) by mouth 3 (three) times daily. 180 capsule 3  . ibuprofen (ADVIL) 800 MG tablet Take 1 tablet (800 mg total) by mouth every 8 (eight) hours as needed. 90 tablet 3  . JENCYCLA 0.35 MG tablet Take 1 tablet by mouth daily.    Marland Kitchen lamoTRIgine (LAMICTAL) 150 MG tablet Take 1 tablet (150 mg total) by mouth daily. 90 tablet 0  . medroxyPROGESTERone Acetate 150 MG/ML SUSY     . propranolol (INDERAL) 20 MG tablet Take 1 tablet (20 mg total) by mouth 3 (three) times daily as needed. For severe anxiety attacks 90 tablet 1  . rizatriptan (MAXALT-MLT) 10 MG disintegrating tablet Take 1 tablet (10 mg total) by mouth as needed for migraine. May repeat in 2 hours if needed 9 tablet 11  . rOPINIRole (REQUIP) 0.25 MG tablet Take 1 tablet (0.25 mg total) by mouth at bedtime. 30 tablet 1  . topiramate (TOPAMAX) 100 MG tablet Take 1 tablet by mouth once daily 30 tablet 2  . venlafaxine XR (EFFEXOR-XR) 150 MG 24 hr capsule TAKE 1 CAPSULE BY MOUTH ONCE DAILY WITH BREAKFAST 90 capsule 0   No current facility-administered medications on file prior to visit.    BP 116/80   Pulse 88   Temp (!) 96.1 F (35.6 C) (Temporal)   Ht 5\' 1"  (1.549 m)   Wt 134 lb (60.8 kg)   LMP 01/06/2020 (Within Days)   SpO2 100%   BMI 25.32 kg/m    Objective:   Physical Exam  Respiratory: Effort normal.  Musculoskeletal:     Left shoulder: Tenderness present. No bony tenderness. Decreased range of motion.       Arms:     Comments: Decrease ROM due to pain with forward abduction, posterior abduction.  4/5 strength to left upper extremity.   Neurological: She is alert.           Assessment & Plan:

## 2020-02-29 NOTE — Assessment & Plan Note (Signed)
Acute on chronic flare, follows with physical medicine typically. Exam today consistent for bursitis.  She likely needs a steroid injection, but our sports medicine doctor is out today. Rx for prednisone and methocarbamol courses sent to pharmacy.   She will follow up with physical medicine.  

## 2020-02-29 NOTE — Assessment & Plan Note (Signed)
Acute on chronic flare, follows with physical medicine typically. Exam today consistent for bursitis.  She likely needs a steroid injection, but our sports medicine doctor is out today. Rx for prednisone and methocarbamol courses sent to pharmacy.   She will follow up with physical medicine.

## 2020-02-29 NOTE — Patient Instructions (Signed)
Start prednisone 20 mg tablets for your shoulder. Take 3 tablets for three days, then 2 tablets for three days, then 1 tablet for three days.  You may take the methocarbamol 750 mg tablets every 8 hours as needed.  Avoid taking Ibuprofen for now. Okay to take Tylenol.  Follow up with your physical medicine doctor.   It was a pleasure meeting you!

## 2020-03-05 ENCOUNTER — Encounter
Payer: Commercial Managed Care - PPO | Attending: Physical Medicine & Rehabilitation | Admitting: Physical Medicine and Rehabilitation

## 2020-03-05 DIAGNOSIS — M25512 Pain in left shoulder: Secondary | ICD-10-CM | POA: Insufficient documentation

## 2020-03-05 DIAGNOSIS — M7918 Myalgia, other site: Secondary | ICD-10-CM | POA: Insufficient documentation

## 2020-03-05 DIAGNOSIS — G8929 Other chronic pain: Secondary | ICD-10-CM | POA: Insufficient documentation

## 2020-03-06 ENCOUNTER — Other Ambulatory Visit: Payer: Self-pay

## 2020-03-06 ENCOUNTER — Encounter: Payer: Self-pay | Admitting: Psychiatry

## 2020-03-06 ENCOUNTER — Telehealth (INDEPENDENT_AMBULATORY_CARE_PROVIDER_SITE_OTHER): Payer: Commercial Managed Care - PPO | Admitting: Psychiatry

## 2020-03-06 DIAGNOSIS — F17200 Nicotine dependence, unspecified, uncomplicated: Secondary | ICD-10-CM

## 2020-03-06 DIAGNOSIS — F410 Panic disorder [episodic paroxysmal anxiety] without agoraphobia: Secondary | ICD-10-CM

## 2020-03-06 DIAGNOSIS — F3161 Bipolar disorder, current episode mixed, mild: Secondary | ICD-10-CM | POA: Diagnosis not present

## 2020-03-06 DIAGNOSIS — F5105 Insomnia due to other mental disorder: Secondary | ICD-10-CM

## 2020-03-06 DIAGNOSIS — F424 Excoriation (skin-picking) disorder: Secondary | ICD-10-CM | POA: Diagnosis not present

## 2020-03-06 MED ORDER — AMITRIPTYLINE HCL 25 MG PO TABS: 25.0000 mg | ORAL_TABLET | Freq: Every day | ORAL | 1 refills | Status: DC

## 2020-03-06 MED ORDER — VENLAFAXINE HCL ER 37.5 MG PO CP24: 37.5000 mg | ORAL_CAPSULE | Freq: Every day | ORAL | 1 refills | Status: DC

## 2020-03-06 NOTE — Progress Notes (Signed)
Provider Location : [facility] Patient Location : [place]  Virtual Visit via Video Note  I connected with Lindsay Nicholson on 03/06/20 at 10:40 AM EDT by a video enabled telemedicine application and verified that I am speaking with the correct person using two identifiers.   I discussed the limitations of evaluation and management by telemedicine and the availability of in person appointments. The patient expressed understanding and agreed to proceed. I discussed the assessment and treatment plan with the patient. The patient was provided an opportunity to ask questions and all were answered. The patient agreed with the plan and demonstrated an understanding of the instructions.   The patient was advised to call back or seek an in-person evaluation if the symptoms worsen or if the condition fails to improve as anticipated.   [place] MD OP Progress Note  03/06/2020 10:58 AM Camil Hausmann  MRN:  [id number]  Chief Complaint:  Chief Complaint    Follow-up     HPI: Lindsay Nicholson is a 34 year old Hispanic female, lives in [place], has a history of bipolar disorder, panic attacks, skin picking disorder, tobacco use disorder was evaluated by telemedicine today.  Patient today reports she is currently struggling with irritability.  She reports irritability is getting worse.  She reports she does not know what could be triggering it.  She just knows that it is getting worse.  She reports sleep is improved.  She reports she is compliant on her medications as prescribed.  She denies any significant side effects.  She reports she continues to struggle with pain and is currently taking ibuprofen and a muscle relaxant.  She is no longer taking the gabapentin or the Lyrica.  She also takes Elavil for pain low dosage at bedtime.  She reports it is beneficial.  Patient continues to follow-up with therapist Mr. Maye Hides.  Patient denies any suicidality, homicidality or perceptual disturbances.  She reports she is  currently having focus problems at work and wonders what could be contributing to it.  She reports her cigarettes smoking has worsened.  She reports when she is depressed her smoking always gets worse.  She however reports she is ready to quit.  Patient denies any other concerns today.    Visit Diagnosis:    ICD-10-CM   1. Bipolar 1 disorder, mixed, mild (HCC)  F31.61 venlafaxine XR (EFFEXOR-XR) 37.5 MG 24 hr capsule  2. Panic attacks  F41.0 amitriptyline (ELAVIL) 25 MG tablet    venlafaxine XR (EFFEXOR-XR) 37.5 MG 24 hr capsule  3. Insomnia due to mental disorder  F51.05 amitriptyline (ELAVIL) 25 MG tablet  4. Skin-picking disorder  F42.4 amitriptyline (ELAVIL) 25 MG tablet  5. Tobacco use disorder  F17.200     Past Psychiatric History: I have reviewed past psychiatric history from my progress note on 03/15/2019.  Past trials of Wellbutrin, BuSpar, hydroxyzine, Effexor  Past Medical History:  Past Medical History:  Diagnosis Date  . Anxiety   . Cervicalgia   . SVD (spontaneous vaginal delivery) 09/14/2011    Past Surgical History:  Procedure Laterality Date  . LAPAROSCOPIC APPENDECTOMY N/A 11/16/2015   Procedure: APPENDECTOMY LAPAROSCOPIC;  Surgeon: Alphonsa Overall, MD;  Location: [facility];  Service: General;  Laterality: N/A;  . SHOULDER FUSION SURGERY      Family Psychiatric History: I have reviewed family psychiatric history from my progress note on 03/15/2019.  Family History:  Family History  Problem Relation Age of Onset  . Diabetes Father   . Hypertension Father   .  High Cholesterol Father   . Anesthesia problems Neg Hx   . Cancer Neg Hx   . Heart disease Neg Hx   . Stroke Neg Hx   . Migraines Neg Hx     Social History: Reviewed social history from my progress note on 03/15/2019. Social History   Socioeconomic History  . Marital status: Single    Spouse name: Not on file  . Number of children: 3  . Years of education: Not on file  . Highest education level: Associate  degree: occupational, Scientist, product/process development, or vocational program  Occupational History  . Not on file  Tobacco Use  . Smoking status: Current Every Day Smoker    Packs/day: 0.25    Types: Cigarettes  . Smokeless tobacco: Never Used  Vaping Use  . Vaping Use: Never used  Substance and Sexual Activity  . Alcohol use: Yes    Alcohol/week: 0.0 standard drinks    Comment: occasional  . Drug use: No  . Sexual activity: Yes    Birth control/protection: Injection  Other Topics Concern  . Not on file  Social History Narrative   Lives at home with her fiance and her children   Left handed   Caffeine: all day long   Social Determinants of Health   Financial Resource Strain:   . Difficulty of Paying Living Expenses:   Food Insecurity:   . Worried About Programme researcher, broadcasting/film/video in the Last Year:   . Barista in the Last Year:   Transportation Needs:   . Freight forwarder (Medical):   Marland Kitchen Lack of Transportation (Non-Medical):   Physical Activity:   . Days of Exercise per Week:   . Minutes of Exercise per Session:   Stress:   . Feeling of Stress :   Social Connections:   . Frequency of Communication with Friends and Family:   . Frequency of Social Gatherings with Friends and Family:   . Attends Religious Services:   . Active Member of Clubs or Organizations:   . Attends Banker Meetings:   Marland Kitchen Marital Status:     Allergies: No Known Allergies  Metabolic Disorder Labs: No results found for: HGBA1C, MPG No results found for: PROLACTIN Lab Results  Component Value Date   CHOL 176 08/27/2019   TRIG 197.0 (H) 08/27/2019   HDL 38.40 (L) 08/27/2019   CHOLHDL 5 08/27/2019   VLDL 39.4 08/27/2019   LDLCALC 98 08/27/2019   LDLCALC 97 08/08/2017   Lab Results  Component Value Date   TSH 0.93 02/06/2019   TSH 1.02 12/03/2014    Therapeutic Level Labs: No results found for: LITHIUM No results found for: VALPROATE No components found for:  CBMZ  Current  Medications: Current Outpatient Medications  Medication Sig Dispense Refill  . albuterol (VENTOLIN HFA) 108 (90 Base) MCG/ACT inhaler     . ARIPiprazole (ABILIFY) 10 MG tablet Take 1 tablet (10 mg total) by mouth daily. 30 tablet 1  . ibuprofen (ADVIL) 800 MG tablet Take 1 tablet (800 mg total) by mouth every 8 (eight) hours as needed. 90 tablet 3  . JENCYCLA 0.35 MG tablet Take 1 tablet by mouth daily.    Marland Kitchen lamoTRIgine (LAMICTAL) 150 MG tablet Take 1 tablet (150 mg total) by mouth daily. 90 tablet 0  . methocarbamol (ROBAXIN) 750 MG tablet Take 1 tablet (750 mg total) by mouth every 8 (eight) hours as needed for muscle spasms. 15 tablet 0  . metroNIDAZOLE (METROGEL) 0.75 %  vaginal gel Place vaginally at bedtime.    . propranolol (INDERAL) 20 MG tablet Take 1 tablet (20 mg total) by mouth 3 (three) times daily as needed. For severe anxiety attacks 90 tablet 1  . rizatriptan (MAXALT-MLT) 10 MG disintegrating tablet Take 1 tablet (10 mg total) by mouth as needed for migraine. May repeat in 2 hours if needed 9 tablet 11  . rOPINIRole (REQUIP) 0.25 MG tablet Take 1 tablet (0.25 mg total) by mouth at bedtime. 30 tablet 1  . topiramate (TOPAMAX) 100 MG tablet Take 1 tablet by mouth once daily 30 tablet 2  . amitriptyline (ELAVIL) 25 MG tablet Take 1-2 tablets (25-50 mg total) by mouth at bedtime. Start taking 25 mg po at bedtime for 2 weeks and increase to 50 mg (2 tablets) after that 60 tablet 1  . amoxicillin-clavulanate (AUGMENTIN) 875-125 MG tablet Take 1 tablet by mouth 2 (two) times daily. (Patient not taking: Reported on 03/06/2020) 20 tablet 0  . gabapentin (NEURONTIN) 400 MG capsule Take 1 capsule (400 mg total) by mouth 3 (three) times daily. (Patient not taking: Reported on 03/06/2020) 180 capsule 3  . lidocaine (LIDODERM) 5 % APPLY ONE PATCH TOPICALLY TO CLEAN DRY SKIN. LEAVE ON FOR 12 HOURS THEN REMOVE. MUST WAIT AT LEAST 12 HOURS BEFORE APPLYING PATCH(ES) AGAIN. (Patient not taking: Reported  on 03/06/2020)    . medroxyPROGESTERone Acetate 150 MG/ML SUSY  (Patient not taking: Reported on 03/06/2020)    . predniSONE (DELTASONE) 20 MG tablet Take 3 tablets for three days, then 2 tablets for three days, then 1 tablet for three days. (Patient not taking: Reported on 03/06/2020) 18 tablet 0  . pregabalin (LYRICA) 25 MG capsule pregabalin 25 mg capsule  TAKE 1 CAPSULE BY MOUTH THREE TIMES DAILY (Patient not taking: Reported on 03/06/2020)    . tiZANidine (ZANAFLEX) 4 MG tablet Take by mouth. (Patient not taking: Reported on 03/06/2020)    . venlafaxine XR (EFFEXOR-XR) 37.5 MG 24 hr capsule Take 1 capsule (37.5 mg total) by mouth daily with breakfast. 30 capsule 1   No current facility-administered medications for this visit.     Musculoskeletal: Strength & Muscle Tone: UTA Gait & Station: normal Patient leans: N/A  Psychiatric Specialty Exam: Review of Systems  Musculoskeletal:       Back and shoulder pain- chronic  Psychiatric/Behavioral: Positive for decreased concentration. The patient is nervous/anxious.        Irritability  All other systems reviewed and are negative.   There were no vitals taken for this visit.There is no height or weight on file to calculate BMI.  General Appearance: Casual  Eye Contact:  Fair  Speech:  Clear and Coherent  Volume:  Normal  Mood:  Anxious, irritability  Affect:  Congruent  Thought Process:  Goal Directed and Descriptions of Associations: Intact  Orientation:  Full (Time, Place, and Person)  Thought Content: Logical   Suicidal Thoughts:  No  Homicidal Thoughts:  No  Memory:  Immediate;   Fair Recent;   Fair Remote;   Fair  Judgement:  Fair  Insight:  Fair  Psychomotor Activity:  Normal  Concentration:  Concentration: Fair and Attention Span: Fair  Recall:  [employer] of Knowledge: Fair  Language: Fair  Akathisia:  No  Handed:  Right  AIMS (if indicated): UTA  Assets:  Communication Skills Desire for  Improvement Housing Intimacy Social Support Talents/Skills Transportation Vocational/Educational  ADL's:  Intact  Cognition: WNL  Sleep:  Fair  Screenings: PHQ2-9     Office Visit from 08/27/2019 in [facility] at [facility] Visit from 08/21/2019 in [facility] Visit from 08[facility]09/2018 in [facility] at [facility] Visit from 03/02/2018 in [facility] Visit from 08/08/2017 in [facility]  PHQ-2 Total Score 1 2 0 0 0  PHQ-9 Total Score 8 7 0 -- --       Assessment and Plan: Lota Leamer  Is a 34 year old Hispanic female, employed, lives in [place], has a history of bipolar disorder, panic attacks, skin picking disorder was evaluated by telemedicine today.  Patient is biologically predisposed given her history of trauma.  Patient with psychosocial stressors of the current pandemic, her medical problems, chronic pain .  Patient continues to report irritability and concentration problems.  Plan as noted below.  Plan Bipolar disorder-unstable Lamotrigine 150 mg p.o. daily divided doses Abilify 10 mg p.o. daily We will taper off venlafaxine.  Patient reports her neurologist is okay with her coming off of the venlafaxine. We will reduce venlafaxine extended release to 37.5 mg p.o. daily.   Panic attacks-improving Propranolol 20 mg p.o. 3 times daily as needed Increase Elavil to 25 mg p.o. nightly for 2 weeks and then to 50 mg p.o. nightly. Continue CBT with Mr. Lucy Chris. Patient was referred for IOP/PHP however has been noncompliant  Skin picking disorder-improving Continue CBT  Insomnia-improving Patient is currently on amitriptyline prescribed for pain which she use as needed. Requip 0.25 mg p.o. nightly as needed for restless leg symptoms Patient advised to continue to work on sleep hygiene techniques  Tobacco use disorder-unstable Provided  information for [facility] quit now program. Provided counseling.  Follow-up in clinic in 6 weeks or sooner if needed.  I have spent atleast 20 minutes non face to face with patient today. More than 50 % of the time was spent for preparing to see the patient ( e.g., review of test, records ),  ordering medications and test ,psychoeducation and supportive psychotherapy and care coordination,as well as documenting clinical information in electronic health record. This note was generated in part or whole with voice recognition software. Voice recognition is usually quite accurate but there are transcription errors that can and very often do occur. I apologize for any typographical errors that were not detected and corrected.      Jomarie Longs, MD 03/06/2020, 10:58 AM

## 2020-03-17 ENCOUNTER — Other Ambulatory Visit: Payer: Self-pay | Admitting: Psychiatry

## 2020-03-17 ENCOUNTER — Other Ambulatory Visit: Payer: Self-pay | Admitting: Internal Medicine

## 2020-03-17 DIAGNOSIS — F3160 Bipolar disorder, current episode mixed, unspecified: Secondary | ICD-10-CM

## 2020-04-11 ENCOUNTER — Other Ambulatory Visit: Payer: Self-pay | Admitting: Internal Medicine

## 2020-04-17 ENCOUNTER — Other Ambulatory Visit: Payer: Self-pay | Admitting: Psychiatry

## 2020-04-17 ENCOUNTER — Other Ambulatory Visit: Payer: Self-pay | Admitting: Physical Medicine and Rehabilitation

## 2020-04-17 DIAGNOSIS — F410 Panic disorder [episodic paroxysmal anxiety] without agoraphobia: Secondary | ICD-10-CM

## 2020-04-17 DIAGNOSIS — F3161 Bipolar disorder, current episode mixed, mild: Secondary | ICD-10-CM

## 2020-04-17 DIAGNOSIS — F3160 Bipolar disorder, current episode mixed, unspecified: Secondary | ICD-10-CM

## 2020-04-22 DIAGNOSIS — Z0289 Encounter for other administrative examinations: Secondary | ICD-10-CM

## 2020-04-24 ENCOUNTER — Telehealth: Payer: Self-pay | Admitting: Internal Medicine

## 2020-04-24 ENCOUNTER — Telehealth: Payer: Self-pay

## 2020-04-24 NOTE — Telephone Encounter (Signed)
Noted, agree with advice given 

## 2020-04-24 NOTE — Telephone Encounter (Signed)
Agree with ER eval.
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
## 2020-04-24 NOTE — Telephone Encounter (Signed)
Lindsay Nicholson(DPR signed) pt is at work and is drowsy, not fully alert and pt feels like she is going to pass out. No available appts at [facility]. Call 911 if needed and pt should go to UC or ED for eval. Viviann Spare voiced understanding. FYI to Pamala Hurry NP.
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
## 2020-04-30 ENCOUNTER — Ambulatory Visit: Payer: Commercial Managed Care - PPO | Admitting: Internal Medicine

## 2020-04-30 NOTE — Progress Notes (Deleted)
Subjective:    Patient ID: Lindsay Nicholson, female    DOB: [birth year], 34 y.o.   MRN: [id number]  HPI  Patient presents the clinic today with complaint of fatigue.  She has a history of anxiety, bipolar depression and insomnia.  Review of Systems      Past Medical History:  Diagnosis Date  . Anxiety   . Cervicalgia   . SVD (spontaneous vaginal delivery) 09/14/2011    Current Outpatient Medications  Medication Sig Dispense Refill  . albuterol (VENTOLIN HFA) 108 (90 Base) MCG/ACT inhaler     . amitriptyline (ELAVIL) 25 MG tablet Take 1-2 tablets (25-50 mg total) by mouth at bedtime. Start taking 25 mg po at bedtime for 2 weeks and increase to 50 mg (2 tablets) after that 60 tablet 1  . amoxicillin-clavulanate (AUGMENTIN) 875-125 MG tablet Take 1 tablet by mouth 2 (two) times daily. (Patient not taking: Reported on 03/06/2020) 20 tablet 0  . ARIPiprazole (ABILIFY) 10 MG tablet Take 1 tablet by mouth once daily 30 tablet 0  . gabapentin (NEURONTIN) 400 MG capsule Take 1 capsule (400 mg total) by mouth 3 (three) times daily. (Patient not taking: Reported on 03/06/2020) 180 capsule 3  . ibuprofen (ADVIL) 800 MG tablet Take 1 tablet (800 mg total) by mouth every 8 (eight) hours as needed. 90 tablet 3  . JENCYCLA 0.35 MG tablet Take 1 tablet by mouth daily.    Marland Kitchen lamoTRIgine (LAMICTAL) 150 MG tablet Take 1 tablet by mouth once daily 90 tablet 0  . lidocaine (LIDODERM) 5 % APPLY ONE PATCH TOPICALLY TO CLEAN DRY SKIN. LEAVE ON FOR 12 HOURS THEN REMOVE. MUST WAIT AT LEAST 12 HOURS BEFORE APPLYING PATCH(ES) AGAIN. (Patient not taking: Reported on 03/06/2020)    . medroxyPROGESTERone Acetate 150 MG/ML SUSY  (Patient not taking: Reported on 03/06/2020)    . methocarbamol (ROBAXIN) 750 MG tablet Take 1 tablet (750 mg total) by mouth every 8 (eight) hours as needed for muscle spasms. 15 tablet 0  . metroNIDAZOLE (METROGEL) 0.75 % vaginal gel Place vaginally at bedtime.    . predniSONE (DELTASONE) 20 MG tablet  Take 3 tablets for three days, then 2 tablets for three days, then 1 tablet for three days. (Patient not taking: Reported on 03/06/2020) 18 tablet 0  . pregabalin (LYRICA) 25 MG capsule pregabalin 25 mg capsule  TAKE 1 CAPSULE BY MOUTH THREE TIMES DAILY (Patient not taking: Reported on 03/06/2020)    . propranolol (INDERAL) 20 MG tablet Take 1 tablet (20 mg total) by mouth 3 (three) times daily as needed. For severe anxiety attacks 90 tablet 1  . rizatriptan (MAXALT-MLT) 10 MG disintegrating tablet Take 1 tablet (10 mg total) by mouth as needed for migraine. May repeat in 2 hours if needed 9 tablet 11  . rOPINIRole (REQUIP) 0.25 MG tablet Take 1 tablet (0.25 mg total) by mouth at bedtime. 30 tablet 1  . tiZANidine (ZANAFLEX) 4 MG tablet Take by mouth. (Patient not taking: Reported on 03/06/2020)    . topiramate (TOPAMAX) 100 MG tablet Take 1 tablet by mouth once daily 30 tablet 1  . venlafaxine XR (EFFEXOR-XR) 37.5 MG 24 hr capsule TAKE 1 CAPSULE BY MOUTH ONCE DAILY WITH BREAKFAST **DOSE  REDUCTION,  BEING  TAPERD  OFF** 30 capsule 0   No current facility-administered medications for this visit.    No Known Allergies  Family History  Problem Relation Age of Onset  . Diabetes Father   . Hypertension Father   .  High Cholesterol Father   . Anesthesia problems Neg Hx   . Cancer Neg Hx   . Heart disease Neg Hx   . Stroke Neg Hx   . Migraines Neg Hx     Social History   Socioeconomic History  . Marital status: Single    Spouse name: Not on file  . Number of children: 3  . Years of education: Not on file  . Highest education level: Associate degree: occupational, Scientist, product/process development, or vocational program  Occupational History  . Not on file  Tobacco Use  . Smoking status: Current Every Day Smoker    Packs/day: 0.25    Types: Cigarettes  . Smokeless tobacco: Never Used  Vaping Use  . Vaping Use: Never used  Substance and Sexual Activity  . Alcohol use: Yes    Alcohol/week: 0.0 standard  drinks    Comment: occasional  . Drug use: No  . Sexual activity: Yes    Birth control/protection: Injection  Other Topics Concern  . Not on file  Social History Narrative   Lives at home with her fiance and her children   Left handed   Caffeine: all day long   Social Determinants of Health   Financial Resource Strain:   . Difficulty of Paying Living Expenses:   Food Insecurity:   . Worried About Programme researcher, broadcasting/film/video in the Last Year:   . Barista in the Last Year:   Transportation Needs:   . Freight forwarder (Medical):   Marland Kitchen Lack of Transportation (Non-Medical):   Physical Activity:   . Days of Exercise per Week:   . Minutes of Exercise per Session:   Stress:   . Feeling of Stress :   Social Connections:   . Frequency of Communication with Friends and Family:   . Frequency of Social Gatherings with Friends and Family:   . Attends Religious Services:   . Active Member of Clubs or Organizations:   . Attends Banker Meetings:   Marland Kitchen Marital Status:   Intimate Partner Violence:   . Fear of Current or Ex-Partner:   . Emotionally Abused:   Marland Kitchen Physically Abused:   . Sexually Abused:      Constitutional: Patient reports fatigue.  Denies fever, malaise, headache or abrupt weight changes.  HEENT: Denies eye pain, eye redness, ear pain, ringing in the ears, wax buildup, runny nose, nasal congestion, bloody nose, or sore throat. Respiratory: Denies difficulty breathing, shortness of breath, cough or sputum production.   Cardiovascular: Denies chest pain, chest tightness, palpitations or swelling in the hands or feet.  Gastrointestinal: Denies abdominal pain, bloating, constipation, diarrhea or blood in the stool.  GU: Denies urgency, frequency, pain with urination, burning sensation, blood in urine, odor or discharge. Musculoskeletal: Denies decrease in range of motion, difficulty with gait, muscle pain or joint pain and swelling.  Skin: Denies redness,  rashes, lesions or ulcercations.  Neurological: Patient reports insomnia.  Denies dizziness, difficulty with memory, difficulty with speech or problems with balance and coordination.  Psych: Patient reports history of anxiety and depression.  Denies SI/HI.  No other specific complaints in a complete review of systems (except as listed in HPI above).  Objective:   Physical Exam  There were no vitals taken for this visit. Wt Readings from Last 3 Encounters:  02/29/20 134 lb (60.8 kg)  12/25/19 134 lb (60.8 kg)  11/28/19 135 lb (61.2 kg)    General: Appears their stated age, well developed, well  nourished in NAD. Skin: Warm, dry and intact. No rashes, lesions or ulcerations noted. HEENT: Head: normal shape and size; Eyes: sclera white, no icterus, conjunctiva pink, PERRLA and EOMs intact; Ears: Tm's gray and intact, normal light reflex; Nose: mucosa pink and moist, septum midline; Throat/Mouth: Teeth present, mucosa pink and moist, no exudate, lesions or ulcerations noted.  Neck:  Neck supple, trachea midline. No masses, lumps or thyromegaly present.  Cardiovascular: Normal rate and rhythm. S1,S2 noted.  No murmur, rubs or gallops noted. No JVD or BLE edema. No carotid bruits noted. Pulmonary/Chest: Normal effort and positive vesicular breath sounds. No respiratory distress. No wheezes, rales or ronchi noted.  Abdomen: Soft and nontender. Normal bowel sounds. No distention or masses noted. Liver, spleen and kidneys non palpable. Musculoskeletal: Normal range of motion. No signs of joint swelling. No difficulty with gait.  Neurological: Alert and oriented. Cranial nerves II-XII grossly intact. Coordination normal.  Psychiatric: Mood and affect normal. Behavior is normal. Judgment and thought content normal.    BMET    Component Value Date/Time   NA 142 08/27/2019 1225   K 3.6 08/27/2019 1225   CL 110 08/27/2019 1225   CO2 23 08/27/2019 1225   GLUCOSE 90 08/27/2019 1225   BUN 13  08/27/2019 1225   CREATININE 1.01 08/27/2019 1225   CALCIUM 9.6 08/27/2019 1225   GFRNONAA >60 11/16/2015 0832   GFRAA >60 11/16/2015 0832    Lipid Panel     Component Value Date/Time   CHOL 176 08/27/2019 1225   TRIG 197.0 (H) 08/27/2019 1225   HDL 38.40 (L) 08/27/2019 1225   CHOLHDL 5 08/27/2019 1225   VLDL 39.4 08/27/2019 1225   LDLCALC 98 08/27/2019 1225    CBC    Component Value Date/Time   WBC 7.2 02/06/2019 1602   RBC 4.22 02/06/2019 1602   HGB 13.4 02/06/2019 1602   HCT 39.3 02/06/2019 1602   PLT 269.0 02/06/2019 1602   MCV 93.2 02/06/2019 1602   MCH 30.5 11/16/2015 0832   MCHC 34.1 02/06/2019 1602   RDW 15.1 02/06/2019 1602   LYMPHSABS 0.9 11/16/2015 0832   MONOABS 0.8 11/16/2015 0832   EOSABS 0.0 11/16/2015 0832   BASOSABS 0.0 11/16/2015 0832    Hgb A1C No results found for: HGBA1C          Assessment & Plan:   Nicki Reaper, NP This visit occurred during the SARS-CoV-2 public health emergency.  Safety protocols were in place, including screening questions prior to the visit, additional usage of staff PPE, and extensive cleaning of exam room while observing appropriate contact time as indicated for disinfecting solutions.

## 2020-05-05 ENCOUNTER — Encounter: Payer: Self-pay | Admitting: Internal Medicine

## 2020-05-08 ENCOUNTER — Ambulatory Visit: Payer: Self-pay | Admitting: Internal Medicine

## 2020-05-09 ENCOUNTER — Telehealth (INDEPENDENT_AMBULATORY_CARE_PROVIDER_SITE_OTHER): Payer: Commercial Managed Care - PPO | Admitting: Psychiatry

## 2020-05-09 ENCOUNTER — Other Ambulatory Visit: Payer: Self-pay

## 2020-05-09 ENCOUNTER — Encounter: Payer: Self-pay | Admitting: Psychiatry

## 2020-05-09 DIAGNOSIS — Z9111 Patient's noncompliance with dietary regimen: Secondary | ICD-10-CM

## 2020-05-09 DIAGNOSIS — F17200 Nicotine dependence, unspecified, uncomplicated: Secondary | ICD-10-CM

## 2020-05-09 DIAGNOSIS — Z91199 Patient's noncompliance with other medical treatment and regimen due to unspecified reason: Secondary | ICD-10-CM

## 2020-05-09 DIAGNOSIS — Z9189 Other specified personal risk factors, not elsewhere classified: Secondary | ICD-10-CM | POA: Insufficient documentation

## 2020-05-09 DIAGNOSIS — F424 Excoriation (skin-picking) disorder: Secondary | ICD-10-CM

## 2020-05-09 DIAGNOSIS — R55 Syncope and collapse: Secondary | ICD-10-CM | POA: Insufficient documentation

## 2020-05-09 DIAGNOSIS — F410 Panic disorder [episodic paroxysmal anxiety] without agoraphobia: Secondary | ICD-10-CM

## 2020-05-09 DIAGNOSIS — F5105 Insomnia due to other mental disorder: Secondary | ICD-10-CM

## 2020-05-09 DIAGNOSIS — F3161 Bipolar disorder, current episode mixed, mild: Secondary | ICD-10-CM

## 2020-05-09 MED ORDER — VENLAFAXINE HCL ER 37.5 MG PO CP24: ORAL_CAPSULE | ORAL | 0 refills | Status: DC

## 2020-05-09 MED ORDER — LURASIDONE HCL 20 MG PO TABS: 20.0000 mg | ORAL_TABLET | Freq: Every day | ORAL | 1 refills | Status: DC

## 2020-05-09 MED ORDER — PROPRANOLOL HCL 20 MG PO TABS
20.0000 mg | ORAL_TABLET | Freq: Three times a day (TID) | ORAL | 1 refills | Status: AC | PRN
Start: 2020-05-09 — End: ?

## 2020-05-09 NOTE — Patient Instructions (Signed)
Lurasidone oral tablet What is this medicine? LURASIDONE (loo RAS i done) is an antipsychotic. It is used to treat schizophrenia or bipolar disorder, also known as manic-depression. This medicine may be used for other purposes; ask your health care provider or pharmacist if you have questions. COMMON BRAND NAME(S): Latuda What should I tell my health care provider before I take this medicine? They need to know if you have any of these conditions:  dementia  diabetes  difficulty swallowing  heart disease  history of breast cancer  kidney disease  liver disease  low blood counts, like low white cell, platelet, or red cell counts  low blood pressure  Parkinson's disease  seizures  suicidal thoughts, plans, or attempt; a previous suicide attempt by you or a family member  an unusual or allergic reaction to lurasidone, other medicines, foods, dyes, or preservatives  pregnant or trying to get pregnant  breast-feeding How should I use this medicine? Take this medicine by mouth with a glass of water. Follow the directions on the prescription label. Take this medicine with food. Take your medicine at regular intervals. Do not take it more often than directed. Do not stop taking except on your doctor's advice. A special MedGuide will be given to you by the pharmacist with each prescription and refill. Be sure to read this information carefully each time. Talk to your pediatrician regarding the use of this medicine in children. While this drug may be prescribed for children as young as 10 years for selected conditions, precautions do apply. Overdosage: If you think you have taken too much of this medicine contact a poison control center or emergency room at once. NOTE: This medicine is only for you. Do not share this medicine with others. What if I miss a dose? If you miss a dose, take it as soon as you can. If it is almost time for your next dose, take only that dose. Do not take  double or extra doses. What may interact with this medicine? Do not take this medicine with any of the following medications:  avasimibe  certain medicines for fungal infections like ketoconazole, voriconazole  certain medicines for seizures like carbamazepine, phenytoin  clarithromycin  mibefradil  rifampin  ritonavir  St. John's Wort This medicine may also interact with the following medications:  atazanavir  bosentan  certain medicines for anxiety or sleep  diltiazem  efavirenz  erythromycin  etravirine  fluconazole  grapefruit juice  medicines for blood pressure  modafinil  nafcillin  verapamil This list may not describe all possible interactions. Give your health care provider a list of all the medicines, herbs, non-prescription drugs, or dietary supplements you use. Also tell them if you smoke, drink alcohol, or use illegal drugs. Some items may interact with your medicine. What should I watch for while using this medicine? Visit your health care professional for regular checks on your progress. Tell your health care professional if symptoms do not start to get better or if they get worse. Do not stop taking except on your health care professional's advice. You may develop a severe reaction. Your health care professional will tell you how much medicine to take. Patients and their families should watch out for new or worsening depression or thoughts of suicide. Also watch out for sudden changes in feelings such as feeling anxious, agitated, panicky, irritable, hostile, aggressive, impulsive, severely restless, overly excited and hyperactive, or not being able to sleep. If this happens, especially at the beginning of treatment or   after a change in dose, call your healthcare professional. This medicine may increase blood sugar. Ask your health care provider if changes in diet or medicines are needed if you have diabetes. You may get dizzy or drowsy. Do not drive,  use machinery, or do anything that needs mental alertness until you know how this medicine affects you. Do not stand or sit up quickly, especially if you are an older patient. This reduces the risk of dizzy or fainting spells. Alcohol may interfere with the effect of this medicine. Avoid alcoholic drinks. This medicine may cause dry eyes and blurred vision. If you wear contact lenses you may feel some discomfort. Lubricating drops may help. See your eye doctor if the problem does not go away or is severe. This drug can cause problems with controlling your body temperature. It can lower the response of your body to cold temperatures. If possible, stay indoors during cold weather. If you must go outdoors, wear warm clothes. It can also lower the response of your body to heat. Do not overheat. Do not over-exercise. Stay out of the sun when possible. If you must be in the sun, wear cool clothing. Drink plenty of water. If you have trouble controlling your body temperature, call your health care provider right away. What side effects may I notice from receiving this medicine? Side effects that you should report to your doctor or health care professional as soon as possible:  allergic reactions like skin rash, itching or hives, swelling of the face, lips, or tongue  breathing problems  confusion  elevated mood, decreased need for sleep, racing thoughts, impulsive behavior  fever or chills, sore throat  inability to keep still  males: prolonged or painful erection  problems with balance, talking, walking  seizures  signs and symptoms of high blood sugar such as being more thirsty or hungry or having to urinate more than normal. You may also feel very tired or have blurry vision  signs and symptoms of low blood pressure like dizziness; feeling faint or lightheaded, falls; unusually weak or tired  signs and symptoms of neuroleptic malignant syndrome like confusion; fast or irregular heartbeat;  high fever; increased sweating; stiff muscles  sudden numbness or weakness of the face, arm, or leg  suicidal thoughts or other mood changes  trouble swallowing  uncontrollable movements of the arms, face, head, mouth, neck, or upper body Side effects that usually do not require medical attention (report to your doctor or health care professional if they continue or are bothersome):  drowsiness  nausea  runny nose  tiredness  weight gain This list may not describe all possible side effects. Call your doctor for medical advice about side effects. You may report side effects to FDA at 1-800-FDA-1088. Where should I keep my medicine? Keep out of the reach of children. Store at room temperature between 15 and 30 degrees C (59 and 86 degrees F). Throw away any unused medicine after the expiration date. NOTE: This sheet is a summary. It may not cover all possible information. If you have questions about this medicine, talk to your doctor, pharmacist, or health care provider.  2020 Elsevier/Gold Standard (2019-06-26 16:27:35)  

## 2020-05-09 NOTE — Progress Notes (Signed)
Provider Location : [facility] Patient Location : Work  Participants: Patient , Provider  Virtual Visit via Video Note  I connected with Lindsay Nicholson on 05/09/20 at 11:00 AM EDT by a video enabled telemedicine application and verified that I am speaking with the correct person using two identifiers.   I discussed the limitations of evaluation and management by telemedicine and the availability of in person appointments. The patient expressed understanding and agreed to proceed.    I discussed the assessment and treatment plan with the patient. The patient was provided an opportunity to ask questions and all were answered. The patient agreed with the plan and demonstrated an understanding of the instructions.   The patient was advised to call back or seek an in-person evaluation if the symptoms worsen or if the condition fails to improve as anticipated.   BH MD OP Progress Note  05/09/2020 12:36 PM Sinthia Karabin  MRN:  [id number]  Chief Complaint:  Chief Complaint    Follow-up     HPI: Lindsay Nicholson is a 34 year old Hispanic female, lives in [place], employed, has a history of bipolar disorder, panic attacks, skin picking disorder, tobacco use disorder was evaluated by telemedicine today.  Patient today reports she is currently struggling with depressive symptoms.  She reports she feels irritable often.  She also has concentration problems at work.  She reports this does affect her ability to function at work.  She has been making careless mistakes at work and has gotten into trouble.  She reports she feels depressed and hence has been smoking cigarettes more.  She reports none of her medications are working with her irritability or depressive symptoms.  She has been noncompliant with psychotherapy sessions.  She reports the last time she spoke to her therapist was a few months ago.  Patient was encouraged several times in the past to be compliant with therapy sessions however she does not follow  through with recommendations.  She reports she is waiting for her therapist to call her.  Encouraged patient to give her therapist a call to schedule a follow-up appointment and to have more frequent sessions.  Patient reports she has been having these passing out episodes recently.  She reports she passes out when she is driving her car, passes out when she is in the middle of her work or when she is talking to someone.  These passing out episodes last for a few seconds.  She reports she may have had this previously when she was working remotely however thought she was just sleeping.  Now that she is at work she is noticing it more.  She reports she went to the urgent care recently and they did some work-up on her.  She reports they asked her to schedule an appointment with her primary care provider for further management.  Patient today appeared to be in pain.  She was seen as holding her forehead with her hands the entire session.  When asked about her headaches however patient reports it could be improving.  She however reports she has a headache today and she takes Tylenol as needed which helps.  Patient denies any suicidality, homicidality or perceptual disturbances.  Patient denies any cannabis abuse or any other drug abuse at this time.    Visit Diagnosis:    ICD-10-CM   1. Bipolar 1 disorder, mixed, mild (HCC)  F31.61 lurasidone (LATUDA) 20 MG TABS tablet    EKG 12-Lead    venlafaxine XR (EFFEXOR-XR) 37.5 MG 24  hr capsule  2. Panic attacks  F41.0 venlafaxine XR (EFFEXOR-XR) 37.5 MG 24 hr capsule    propranolol (INDERAL) 20 MG tablet  3. Insomnia due to mental disorder  F51.05   4. Skin-picking disorder  F42.4   5. Tobacco use disorder  F17.200   6. At risk for long QT syndrome  Z91.89   7. Syncope, unspecified syncope type  R55   8. Noncompliance with treatment plan  Z91.11     Past Psychiatric History: I have reviewed past psychiatric history from my progress note on 03/15/2019.   Past trials of Wellbutrin, BuSpar, hydroxyzine, Effexor  Past Medical History:  Past Medical History:  Diagnosis Date  . Anxiety   . Cervicalgia   . SVD (spontaneous vaginal delivery) 09/14/2011    Past Surgical History:  Procedure Laterality Date  . LAPAROSCOPIC APPENDECTOMY N/A 11/16/2015   Procedure: APPENDECTOMY LAPAROSCOPIC;  Surgeon: Ovidio Kin, MD;  Location: [facility];  Service: General;  Laterality: N/A;  . SHOULDER FUSION SURGERY      Family Psychiatric History: I have reviewed family psychiatric history from my progress note on 03/15/2019  Family History:  Family History  Problem Relation Age of Onset  . Diabetes Father   . Hypertension Father   . High Cholesterol Father   . Anesthesia problems Neg Hx   . Cancer Neg Hx   . Heart disease Neg Hx   . Stroke Neg Hx   . Migraines Neg Hx     Social History: Reviewed social history from my progress note on 03/15/2019 Social History   Socioeconomic History  . Marital status: Single    Spouse name: Not on file  . Number of children: 3  . Years of education: Not on file  . Highest education level: Associate degree: occupational, Scientist, product/process development, or vocational program  Occupational History  . Not on file  Tobacco Use  . Smoking status: Current Every Day Smoker    Packs/day: 0.25    Types: Cigarettes  . Smokeless tobacco: Never Used  Vaping Use  . Vaping Use: Never used  Substance and Sexual Activity  . Alcohol use: Yes    Alcohol/week: 0.0 standard drinks    Comment: occasional  . Drug use: No  . Sexual activity: Yes    Birth control/protection: Injection  Other Topics Concern  . Not on file  Social History Narrative   Lives at home with her fiance and her children   Left handed   Caffeine: all day long   Social Determinants of Health   Financial Resource Strain:   . Difficulty of Paying Living Expenses: Not on file  Food Insecurity:   . Worried About Programme researcher, broadcasting/film/video in the Last Year: Not on file  . Ran Out  of Food in the Last Year: Not on file  Transportation Needs:   . Lack of Transportation (Medical): Not on file  . Lack of Transportation (Non-Medical): Not on file  Physical Activity:   . Days of Exercise per Week: Not on file  . Minutes of Exercise per Session: Not on file  Stress:   . Feeling of Stress : Not on file  Social Connections:   . Frequency of Communication with Friends and Family: Not on file  . Frequency of Social Gatherings with Friends and Family: Not on file  . Attends Religious Services: Not on file  . Active Member of Clubs or Organizations: Not on file  . Attends Banker Meetings: Not on file  .  Marital Status: Not on file    Allergies: No Known Allergies  Metabolic Disorder Labs: No results found for: HGBA1C, MPG No results found for: PROLACTIN Lab Results  Component Value Date   CHOL 176 08/27/2019   TRIG 197.0 (H) 08/27/2019   HDL 38.40 (L) 08/27/2019   CHOLHDL 5 08/27/2019   VLDL 39.4 08/27/2019   LDLCALC 98 08/27/2019   LDLCALC 97 08/08/2017   Lab Results  Component Value Date   TSH 0.93 02/06/2019   TSH 1.02 12/03/2014    Therapeutic Level Labs: No results found for: LITHIUM No results found for: VALPROATE No components found for:  CBMZ  Current Medications: Current Outpatient Medications  Medication Sig Dispense Refill  . amitriptyline (ELAVIL) 25 MG tablet Take 1-2 tablets (25-50 mg total) by mouth at bedtime. Start taking 25 mg po at bedtime for 2 weeks and increase to 50 mg (2 tablets) after that 60 tablet 1  . ibuprofen (ADVIL) 800 MG tablet Take 1 tablet (800 mg total) by mouth every 8 (eight) hours as needed. 90 tablet 3  . JENCYCLA 0.35 MG tablet Take 1 tablet by mouth daily.    Marland Kitchen lamoTRIgine (LAMICTAL) 150 MG tablet Take 1 tablet by mouth once daily 90 tablet 0  . propranolol (INDERAL) 20 MG tablet Take 1 tablet (20 mg total) by mouth 3 (three) times daily as needed. For severe anxiety attacks 90 tablet 1  .  rizatriptan (MAXALT-MLT) 10 MG disintegrating tablet Take 1 tablet (10 mg total) by mouth as needed for migraine. May repeat in 2 hours if needed 9 tablet 11  . topiramate (TOPAMAX) 100 MG tablet Take 1 tablet by mouth once daily 30 tablet 1  . venlafaxine XR (EFFEXOR-XR) 37.5 MG 24 hr capsule TAKE 1 CAPSULE BY MOUTH ONCE DAILY WITH BREAKFAST **DOSE  REDUCTION,  BEING  TAPERD  OFF** 30 capsule 0  . albuterol (VENTOLIN HFA) 108 (90 Base) MCG/ACT inhaler  (Patient not taking: Reported on 05/09/2020)    . amoxicillin-clavulanate (AUGMENTIN) 875-125 MG tablet Take 1 tablet by mouth 2 (two) times daily. (Patient not taking: Reported on 03/06/2020) 20 tablet 0  . gabapentin (NEURONTIN) 400 MG capsule Take 1 capsule (400 mg total) by mouth 3 (three) times daily. (Patient not taking: Reported on 03/06/2020) 180 capsule 3  . lidocaine (LIDODERM) 5 % APPLY ONE PATCH TOPICALLY TO CLEAN DRY SKIN. LEAVE ON FOR 12 HOURS THEN REMOVE. MUST WAIT AT LEAST 12 HOURS BEFORE APPLYING PATCH(ES) AGAIN. (Patient not taking: Reported on 03/06/2020)    . lurasidone (LATUDA) 20 MG TABS tablet Take 1 tablet (20 mg total) by mouth daily with supper. 30 tablet 1  . medroxyPROGESTERone Acetate 150 MG/ML SUSY  (Patient not taking: Reported on 03/06/2020)    . methocarbamol (ROBAXIN) 750 MG tablet Take 1 tablet (750 mg total) by mouth every 8 (eight) hours as needed for muscle spasms. (Patient not taking: Reported on 05/09/2020) 15 tablet 0  . metroNIDAZOLE (METROGEL) 0.75 % vaginal gel Place vaginally at bedtime. (Patient not taking: Reported on 05/09/2020)    . predniSONE (DELTASONE) 20 MG tablet Take 3 tablets for three days, then 2 tablets for three days, then 1 tablet for three days. (Patient not taking: Reported on 03/06/2020) 18 tablet 0  . pregabalin (LYRICA) 25 MG capsule pregabalin 25 mg capsule  TAKE 1 CAPSULE BY MOUTH THREE TIMES DAILY (Patient not taking: Reported on 03/06/2020)    . tiZANidine (ZANAFLEX) 4 MG tablet Take by mouth.  (Patient not taking:  Reported on 05/09/2020)     No current facility-administered medications for this visit.     Musculoskeletal: Strength & Muscle Tone: UTA Gait & Station: normal Patient leans: N/A  Psychiatric Specialty Exam: Review of Systems  Neurological: Positive for syncope and headaches.  Psychiatric/Behavioral: Positive for decreased concentration and dysphoric mood.       Irritability  All other systems reviewed and are negative.   There were no vitals taken for this visit.There is no height or weight on file to calculate BMI.  General Appearance: Casual  Eye Contact:  Fair  Speech:  Clear and Coherent  Volume:  Normal  Mood:  Depressed and Irritable  Affect:  Congruent  Thought Process:  Goal Directed and Descriptions of Associations: Intact  Orientation:  Full (Time, Place, and Person)  Thought Content: Logical   Suicidal Thoughts:  No  Homicidal Thoughts:  No  Memory:  Immediate;   Fair Recent;   Fair Remote;   Fair  Judgement:  Fair  Insight:  Fair  Psychomotor Activity:  Normal  Concentration:  Concentration: Fair and Attention Span: Fair  Recall:  [employer] of Knowledge: Fair  Language: Fair  Akathisia:  No  Handed:  Right  AIMS (if indicated): UTA  Assets:  Communication Skills Desire for Improvement Housing Social Support  ADL's:  Intact  Cognition: WNL  Sleep:  Improving   Screenings: PHQ2-9     Office Visit from 08/27/2019 in [facility] Visit from 08/21/2019 in [facility] Visit from 08/09/2018 in [facility] Visit from 03/02/2018 in [facility] Visit from 08/08/2017 in [facility] at [facility]  PHQ-2 Total Score 1 2 0 0 0  PHQ-9 Total Score 8 7 0 -- --       Assessment and Plan: Demya Scruggs is a 34 year old Hispanic female, employed, lives in [place], has a history of bipolar disorder, panic  attacks, skin picking disorder was evaluated by telemedicine today.  Patient is biologically predisposed given her history of trauma.  Patient with psychosocial stressors at the current pandemic, her health issues and chronic pain.  Patient continues to struggle with depressive symptoms, irritability recent passing out episodes.  Patient continues to be noncompliant with psychotherapy sessions as well as recommendations.  Plan as noted below.  Plan Bipolar disorder-unstable Discontinue Abilify for lack of benefit and continued irritability. Start Latuda 20 mg p.o. daily with supper. Lamotrigine 150 mg p.o. daily in divided dosage. Continue venlafaxine extended release at lower dosage of 37.5 mg p.o. daily.  She is currently being tapered off.  Panic attacks-some progress Propranolol 20 mg p.o. 3 times daily as needed Elavil 50 mg p.o. nightly . Continue CBT with Mr. Lucy Chris however patient has been noncompliant.  Skin picking disorder-improving Continue CBT  Insomnia-improving Elavil 50 mg p.o. nightly Discontinue Requip for noncompliance for restless leg symptoms.  She reports she currently does not have restless legs.   Tobacco use disorder-unstable Patient was provided information for [place] quit now program. Patient continues to smoke cigarettes and is currently not cutting back.  Provided counseling  For syncopal episodes-patient advised to reach out to her neurologist as well as her primary care provider for further work-up. She reports she has upcoming appointment with her primary care provider this Tuesday.  At risk for QT syndrome-we will get EKG.  We will order EKG today.  Patient advised to refrain from driving until  she is cleared medically due to her recent passing out episodes.  Follow-up in clinic in 4 weeks or sooner if needed.  I have spent atleast 30 minutes face to face with patient today. More than 50 % of the time was spent for preparing to see the patient  ( e.g., review of test, records ), obtaining and to review and separately obtained history , ordering medications and test ,psychoeducation and supportive psychotherapy and care coordination,as well as documenting clinical information in electronic health record. This note was generated in part or whole with voice recognition software. Voice recognition is usually quite accurate but there are transcription errors that can and very often do occur. I apologize for any typographical errors that were not detected and corrected.        Jomarie LongsSaramma Kahle Mcqueen, MD 05/09/2020, 12:36 PM

## 2020-05-13 ENCOUNTER — Other Ambulatory Visit: Payer: Self-pay

## 2020-05-13 ENCOUNTER — Ambulatory Visit: Payer: Self-pay | Admitting: Internal Medicine

## 2020-05-13 NOTE — Progress Notes (Deleted)
Subjective:    Patient ID: Lindsay Nicholson, female    DOB: [birth year], 34 y.o.   MRN: [id number]  HPI    Review of Systems      Past Medical History:  Diagnosis Date  . Anxiety   . Cervicalgia   . SVD (spontaneous vaginal delivery) 09/14/2011    Current Outpatient Medications  Medication Sig Dispense Refill  . albuterol (VENTOLIN HFA) 108 (90 Base) MCG/ACT inhaler  (Patient not taking: Reported on 05/09/2020)    . amitriptyline (ELAVIL) 25 MG tablet Take 1-2 tablets (25-50 mg total) by mouth at bedtime. Start taking 25 mg po at bedtime for 2 weeks and increase to 50 mg (2 tablets) after that 60 tablet 1  . amoxicillin-clavulanate (AUGMENTIN) 875-125 MG tablet Take 1 tablet by mouth 2 (two) times daily. (Patient not taking: Reported on 03/06/2020) 20 tablet 0  . gabapentin (NEURONTIN) 400 MG capsule Take 1 capsule (400 mg total) by mouth 3 (three) times daily. (Patient not taking: Reported on 03/06/2020) 180 capsule 3  . ibuprofen (ADVIL) 800 MG tablet Take 1 tablet (800 mg total) by mouth every 8 (eight) hours as needed. 90 tablet 3  . JENCYCLA 0.35 MG tablet Take 1 tablet by mouth daily.    Marland Kitchen lamoTRIgine (LAMICTAL) 150 MG tablet Take 1 tablet by mouth once daily 90 tablet 0  . lidocaine (LIDODERM) 5 % APPLY ONE PATCH TOPICALLY TO CLEAN DRY SKIN. LEAVE ON FOR 12 HOURS THEN REMOVE. MUST WAIT AT LEAST 12 HOURS BEFORE APPLYING PATCH(ES) AGAIN. (Patient not taking: Reported on 03/06/2020)    . lurasidone (LATUDA) 20 MG TABS tablet Take 1 tablet (20 mg total) by mouth daily with supper. 30 tablet 1  . medroxyPROGESTERone Acetate 150 MG/ML SUSY  (Patient not taking: Reported on 03/06/2020)    . methocarbamol (ROBAXIN) 750 MG tablet Take 1 tablet (750 mg total) by mouth every 8 (eight) hours as needed for muscle spasms. (Patient not taking: Reported on 05/09/2020) 15 tablet 0  . metroNIDAZOLE (METROGEL) 0.75 % vaginal gel Place vaginally at bedtime. (Patient not taking: Reported on 05/09/2020)    .  predniSONE (DELTASONE) 20 MG tablet Take 3 tablets for three days, then 2 tablets for three days, then 1 tablet for three days. (Patient not taking: Reported on 03/06/2020) 18 tablet 0  . pregabalin (LYRICA) 25 MG capsule pregabalin 25 mg capsule  TAKE 1 CAPSULE BY MOUTH THREE TIMES DAILY (Patient not taking: Reported on 03/06/2020)    . propranolol (INDERAL) 20 MG tablet Take 1 tablet (20 mg total) by mouth 3 (three) times daily as needed. For severe anxiety attacks 90 tablet 1  . rizatriptan (MAXALT-MLT) 10 MG disintegrating tablet Take 1 tablet (10 mg total) by mouth as needed for migraine. May repeat in 2 hours if needed 9 tablet 11  . tiZANidine (ZANAFLEX) 4 MG tablet Take by mouth. (Patient not taking: Reported on 05/09/2020)    . topiramate (TOPAMAX) 100 MG tablet Take 1 tablet by mouth once daily 30 tablet 1  . venlafaxine XR (EFFEXOR-XR) 37.5 MG 24 hr capsule TAKE 1 CAPSULE BY MOUTH ONCE DAILY WITH BREAKFAST **DOSE  REDUCTION,  BEING  TAPERD  OFF** 30 capsule 0   No current facility-administered medications for this visit.    No Known Allergies  Family History  Problem Relation Age of Onset  . Diabetes Father   . Hypertension Father   . High Cholesterol Father   . Anesthesia problems Neg Hx   . Cancer Neg  Hx   . Heart disease Neg Hx   . Stroke Neg Hx   . Migraines Neg Hx     Social History   Socioeconomic History  . Marital status: Single    Spouse name: Not on file  . Number of children: 3  . Years of education: Not on file  . Highest education level: Associate degree: occupational, Scientist, product/process development, or vocational program  Occupational History  . Not on file  Tobacco Use  . Smoking status: Current Every Day Smoker    Packs/day: 0.25    Types: Cigarettes  . Smokeless tobacco: Never Used  Vaping Use  . Vaping Use: Never used  Substance and Sexual Activity  . Alcohol use: Yes    Alcohol/week: 0.0 standard drinks    Comment: occasional  . Drug use: No  . Sexual activity:  Yes    Birth control/protection: Injection  Other Topics Concern  . Not on file  Social History Narrative   Lives at home with her fiance and her children   Left handed   Caffeine: all day long   Social Determinants of Health   Financial Resource Strain:   . Difficulty of Paying Living Expenses: Not on file  Food Insecurity:   . Worried About Programme researcher, broadcasting/film/video in the Last Year: Not on file  . Ran Out of Food in the Last Year: Not on file  Transportation Needs:   . Lack of Transportation (Medical): Not on file  . Lack of Transportation (Non-Medical): Not on file  Physical Activity:   . Days of Exercise per Week: Not on file  . Minutes of Exercise per Session: Not on file  Stress:   . Feeling of Stress : Not on file  Social Connections:   . Frequency of Communication with Friends and Family: Not on file  . Frequency of Social Gatherings with Friends and Family: Not on file  . Attends Religious Services: Not on file  . Active Member of Clubs or Organizations: Not on file  . Attends Banker Meetings: Not on file  . Marital Status: Not on file  Intimate Partner Violence:   . Fear of Current or Ex-Partner: Not on file  . Emotionally Abused: Not on file  . Physically Abused: Not on file  . Sexually Abused: Not on file     Constitutional: Denies fever, malaise, fatigue, headache or abrupt weight changes.  HEENT: Denies eye pain, eye redness, ear pain, ringing in the ears, wax buildup, runny nose, nasal congestion, bloody nose, or sore throat. Respiratory: Denies difficulty breathing, shortness of breath, cough or sputum production.   Cardiovascular: Denies chest pain, chest tightness, palpitations or swelling in the hands or feet.  Gastrointestinal: Denies abdominal pain, bloating, constipation, diarrhea or blood in the stool.  GU: Denies urgency, frequency, pain with urination, burning sensation, blood in urine, odor or discharge. Musculoskeletal: Denies decrease  in range of motion, difficulty with gait, muscle pain or joint pain and swelling.  Skin: Denies redness, rashes, lesions or ulcercations.  Neurological: Denies dizziness, difficulty with memory, difficulty with speech or problems with balance and coordination.  Psych: Denies anxiety, depression, SI/HI.  No other specific complaints in a complete review of systems (except as listed in HPI above).  Objective:   Physical Exam   There were no vitals taken for this visit. Wt Readings from Last 3 Encounters:  02/29/20 134 lb (60.8 kg)  12/25/19 134 lb (60.8 kg)  11/28/19 135 lb (61.2 kg)  General: Appears their stated age, well developed, well nourished in NAD. Skin: Warm, dry and intact. No rashes, lesions or ulcerations noted. HEENT: Head: normal shape and size; Eyes: sclera white, no icterus, conjunctiva pink, PERRLA and EOMs intact; Ears: Tm's gray and intact, normal light reflex; Nose: mucosa pink and moist, septum midline; Throat/Mouth: Teeth present, mucosa pink and moist, no exudate, lesions or ulcerations noted.  Neck:  Neck supple, trachea midline. No masses, lumps or thyromegaly present.  Cardiovascular: Normal rate and rhythm. S1,S2 noted.  No murmur, rubs or gallops noted. No JVD or BLE edema. No carotid bruits noted. Pulmonary/Chest: Normal effort and positive vesicular breath sounds. No respiratory distress. No wheezes, rales or ronchi noted.  Abdomen: Soft and nontender. Normal bowel sounds. No distention or masses noted. Liver, spleen and kidneys non palpable. Musculoskeletal: Normal range of motion. No signs of joint swelling. No difficulty with gait.  Neurological: Alert and oriented. Cranial nerves II-XII grossly intact. Coordination normal.  Psychiatric: Mood and affect normal. Behavior is normal. Judgment and thought content normal.   EKG:  BMET    Component Value Date/Time   NA 142 08/27/2019 1225   K 3.6 08/27/2019 1225   CL 110 08/27/2019 1225   CO2 23  08/27/2019 1225   GLUCOSE 90 08/27/2019 1225   BUN 13 08/27/2019 1225   CREATININE 1.01 08/27/2019 1225   CALCIUM 9.6 08/27/2019 1225   GFRNONAA >60 11/16/2015 0832   GFRAA >60 11/16/2015 0832    Lipid Panel     Component Value Date/Time   CHOL 176 08/27/2019 1225   TRIG 197.0 (H) 08/27/2019 1225   HDL 38.40 (L) 08/27/2019 1225   CHOLHDL 5 08/27/2019 1225   VLDL 39.4 08/27/2019 1225   LDLCALC 98 08/27/2019 1225    CBC    Component Value Date/Time   WBC 7.2 02/06/2019 1602   RBC 4.22 02/06/2019 1602   HGB 13.4 02/06/2019 1602   HCT 39.3 02/06/2019 1602   PLT 269.0 02/06/2019 1602   MCV 93.2 02/06/2019 1602   MCH 30.5 11/16/2015 0832   MCHC 34.1 02/06/2019 1602   RDW 15.1 02/06/2019 1602   LYMPHSABS 0.9 11/16/2015 0832   MONOABS 0.8 11/16/2015 0832   EOSABS 0.0 11/16/2015 0832   BASOSABS 0.0 11/16/2015 0832    Hgb A1C No results found for: HGBA1C         Assessment & Plan:    Nicki Reaper, NP This visit occurred during the SARS-CoV-2 public health emergency.  Safety protocols were in place, including screening questions prior to the visit, additional usage of staff PPE, and extensive cleaning of exam room while observing appropriate contact time as indicated for disinfecting solutions.

## 2020-05-14 ENCOUNTER — Other Ambulatory Visit: Payer: Self-pay | Admitting: Internal Medicine

## 2020-05-14 ENCOUNTER — Other Ambulatory Visit: Payer: Self-pay | Admitting: Physical Medicine and Rehabilitation

## 2020-05-16 ENCOUNTER — Encounter: Payer: Self-pay | Admitting: Internal Medicine

## 2020-05-16 ENCOUNTER — Telehealth: Payer: Self-pay

## 2020-05-16 NOTE — Telephone Encounter (Signed)
received fax from [email or web address] that a Prior Auth was needed for the Latuda.

## 2020-05-16 NOTE — Telephone Encounter (Signed)
Do you want pt to continue meds, under the circumstances? Please advise

## 2020-05-16 NOTE — Telephone Encounter (Signed)
I am not refilling anything until she comes into the office. Please send her a letter stating she will be discharged if she noshows or shows up late for her next appt.

## 2020-05-16 NOTE — Telephone Encounter (Signed)
went online and submitted the prior auth - prior auth was approved 05-16-20 to 05-16-22

## 2020-05-23 ENCOUNTER — Other Ambulatory Visit: Payer: Self-pay | Admitting: Physical Medicine and Rehabilitation

## 2020-05-23 ENCOUNTER — Other Ambulatory Visit: Payer: Self-pay | Admitting: Internal Medicine

## 2020-05-26 ENCOUNTER — Ambulatory Visit: Payer: Self-pay | Admitting: Internal Medicine

## 2020-05-26 DIAGNOSIS — Z0289 Encounter for other administrative examinations: Secondary | ICD-10-CM

## 2020-06-03 ENCOUNTER — Encounter: Payer: Self-pay | Admitting: Internal Medicine

## 2020-06-03 ENCOUNTER — Other Ambulatory Visit: Payer: Self-pay

## 2020-06-03 ENCOUNTER — Ambulatory Visit (INDEPENDENT_AMBULATORY_CARE_PROVIDER_SITE_OTHER): Payer: 59 | Admitting: Internal Medicine

## 2020-06-03 VITALS — BP 110/68 | HR 79 | Temp 97.5°F | Wt 135.0 lb

## 2020-06-03 DIAGNOSIS — G4719 Other hypersomnia: Secondary | ICD-10-CM

## 2020-06-03 DIAGNOSIS — F3161 Bipolar disorder, current episode mixed, mild: Secondary | ICD-10-CM | POA: Diagnosis not present

## 2020-06-03 DIAGNOSIS — Z630 Problems in relationship with spouse or partner: Secondary | ICD-10-CM

## 2020-06-03 NOTE — Patient Instructions (Signed)
Narcolepsy Narcolepsy is a neurological disorder that causes people to fall asleep suddenly and without control (have sleep attacks) during the daytime. It is a lifelong disorder. Narcolepsy disrupts the sleep cycle at night, which then causes daytime sleepiness. What are the causes? The cause of narcolepsy is not fully understood, but it may be related to:  Low levels of hypocretin, a chemical (neurotransmitter) in the brain that controls sleep and wake cycles. Hypocretin imbalance may be caused by: ? Abnormal genes that are passed from parent to child (inherited). ? An autoimmune disease in which the body's defense system (immune system) attacks the brain cells that make hypocretin.  Infection, tumor, or injury in the area of the brain that controls sleep.  Exposure to poisons (toxins), such as heavy metals, pesticides, and secondhand smoke. What are the signs or symptoms? Symptoms of this condition include:  Excessive daytime sleepiness. This is the most common symptom and is usually the first symptom you will notice. This may affect your performance at work or school.  Sleep attacks. You may fall asleep in the middle of an activity, especially low-energy activities like reading or watching TV.  Feeling like you cannot think clearly and trouble focusing or remembering things. You may also feel depressed.  Sudden muscle weakness (cataplexy). When this occurs, your speech may become slurred, or your knees may buckle. Cataplexy is usually triggered by surprise, anger, fear, or laughter.  Losing the ability to speak or move (sleep paralysis). This may occur just as you start to fall asleep or wake up. You will be aware of the paralysis. It usually lasts for just a few seconds or minutes.  Seeing, hearing, tasting, smelling, or feeling things that are not real (hallucinations). Hallucinations may occur with sleep paralysis. They can happen when you are falling asleep, waking up, or  dozing.  Trouble staying asleep at night (insomnia) and restless sleep. How is this diagnosed? This condition may be diagnosed based on:  A physical exam to rule out any other problems that may be causing your symptoms. You may be asked to write down your sleeping patterns for several weeks in a sleep diary. This will help your health care provider make a diagnosis.  Sleep studies that measure how well your REM sleep is regulated. These tests also measure your heart rate, breathing, movement, and brain waves. These tests include: ? An overnight sleep study (polysomnogram). ? A daytime sleep study that is done while you take several naps during the day (multiple sleep latency test, MSLT). This test measures how quickly you fall asleep and how quickly you enter REM sleep.  Removal of spinal fluid to measure hypocretin levels. How is this treated? There is no cure for this condition, but treatment can help relieve symptoms. Treatment may include:  Lifestyle and sleeping strategies to help you cope with the condition, such as: ? Exercising regularly. ? Maintaining a regular sleep schedule. ? Avoiding caffeine and large meals before bed.  Medicines. These may include: ? Medicines that help keep you awake and alert (stimulants) to fight daytime sleepiness. ? Medicines that treat depression (antidepressants). These may be used to treat cataplexy. ? Sodium oxybate. This is a strong medicine to help you relax (sedative) that you may take at night. It can help control daytime sleepiness and cataplexy.  Other treatments may include mental health counseling or joining a support group. Follow these instructions at home: Sleeping habits   Get about 8 hours of sleep every night.  Go to   sleep and get up at about the same time every day.  Keep your bedroom dark, quiet, and comfortable.  When you feel very tired, take short naps. Schedule naps so that you take them at about the same time every  day.  Before bedtime: ? Avoid bright lights and screens. ? Relax. Try activities like reading or taking a warm bath. Activity  Get at least 20 minutes of exercise every day. This will help you sleep better at night and reduce daytime sleepiness.  Avoid exercising within 3 hours of bedtime.  Do not drive or use heavy machinery if you are sleepy. If possible, take a nap before driving.  Do not swim or go out on the water without a life jacket. Eating and drinking  Do not drink alcohol or caffeinated beverages within 4-5 hours of bedtime.  Do not eat a large meal before bedtime.  Eat meals at about the same times every day. General instructions   Take over-the-counter and prescription medicines only as told by your health care provider.  Keep a sleep diary as told by your health care provider.  Tell your employer or teachers that you have narcolepsy. You may be able to adjust your schedule to include time for naps.  Do not use any products that contain nicotine or tobacco, such as cigarettes, e-cigarettes, and chewing tobacco. If you need help quitting, ask your health care provider.  Keep all follow-up visits as told by your health care provider. This is important. Where to find more information  National Institute of Neurological Disorders: www.ninds.nih.gov Contact a health care provider if:  Your symptoms are not getting better.  You have increasingly high blood pressure (hypertension).  You have changes in your heart rhythm.  You are having a hard time determining what is real and what is not (psychosis). Get help right away if you:  Hurt yourself during a sleep attack or an attack of cataplexy.  Have chest pain.  Have trouble breathing. These symptoms may represent a serious problem that is an emergency. Do not wait to see if the symptoms will go away. Get medical help right away. Call your local emergency services (911 in the [place] Do not drive yourself to the  hospital. Summary  Narcolepsy is a neurological disorder that causes people to fall asleep suddenly, and without control, during the daytime (sleep attacks). It is a lifelong disorder.  There is no cure for this condition, but treatment can help relieve symptoms.  Go to sleep and get up at about the same time every day. Follow instructions about sleep and activities as told by your health care provider.  Take over-the-counter and prescription medicines only as told by your health care provider. This information is not intended to replace advice given to you by your health care provider. Make sure you discuss any questions you have with your health care provider. Document Revised: 04/11/2019 Document Reviewed: 04/11/2019 Elsevier Patient Education  2020 [employer]  

## 2020-06-03 NOTE — Progress Notes (Signed)
Subjective:    Patient ID: Lindsay Nicholson, female    DOB: March 31, 1986, 34 y.o.   MRN: [id number]  HPI  Pt presents to the clinic today with c/o "dozing" episodes. She reports this started about 2-3 month ago. She reports during these episodes, she will fall asleep quickly and wake up quickly without intervention. This can happen at random times throughout the day. She reports it has occurred at work, while driving and at home. She can not identify any triggers for this. She reports she typically does not sleep well at night. Lindsay Nicholson energy level during the day is not great. She has poor appetite. She reports Lindsay Nicholson depression is not well controlled despite Lindsay Nicholson meds. She is seeing psychiatry. She does report stress with Lindsay Nicholson estranged husband who is still living with Lindsay Nicholson, general life stress and work. She has not taken any medication OTC for this.   Review of Systems      Past Medical History:  Diagnosis Date  . Anxiety   . Cervicalgia   . SVD (spontaneous vaginal delivery) 09/14/2011    Current Outpatient Medications  Medication Sig Dispense Refill  . albuterol (VENTOLIN HFA) 108 (90 Base) MCG/ACT inhaler     . amitriptyline (ELAVIL) 25 MG tablet Take 1-2 tablets (25-50 mg total) by mouth at bedtime. Start taking 25 mg po at bedtime for 2 weeks and increase to 50 mg (2 tablets) after that 60 tablet 1  . gabapentin (NEURONTIN) 400 MG capsule Take 1 capsule (400 mg total) by mouth 3 (three) times daily. 180 capsule 3  . ibuprofen (ADVIL) 800 MG tablet Take 1 tablet (800 mg total) by mouth every 8 (eight) hours as needed. 90 tablet 3  . JENCYCLA 0.35 MG tablet Take 1 tablet by mouth daily.    Marland Kitchen lamoTRIgine (LAMICTAL) 150 MG tablet Take 1 tablet by mouth once daily 90 tablet 0  . lidocaine (LIDODERM) 5 % APPLY ONE PATCH TOPICALLY TO CLEAN DRY SKIN. LEAVE ON FOR 12 HOURS THEN REMOVE. MUST WAIT AT LEAST 12 HOURS BEFORE APPLYING PATCH(ES) AGAIN.    Marland Kitchen lurasidone (LATUDA) 20 MG TABS tablet Take 1 tablet (20 mg  total) by mouth daily with supper. 30 tablet 1  . medroxyPROGESTERone Acetate 150 MG/ML SUSY     . methocarbamol (ROBAXIN) 750 MG tablet Take 1 tablet (750 mg total) by mouth every 8 (eight) hours as needed for muscle spasms. 15 tablet 0  . metroNIDAZOLE (METROGEL) 0.75 % vaginal gel Place vaginally at bedtime.     . predniSONE (DELTASONE) 20 MG tablet Take 3 tablets for three days, then 2 tablets for three days, then 1 tablet for three days. 18 tablet 0  . pregabalin (LYRICA) 25 MG capsule pregabalin 25 mg capsule  TAKE 1 CAPSULE BY MOUTH THREE TIMES DAILY    . propranolol (INDERAL) 20 MG tablet Take 1 tablet (20 mg total) by mouth 3 (three) times daily as needed. For severe anxiety attacks 90 tablet 1  . rizatriptan (MAXALT-MLT) 10 MG disintegrating tablet Take 1 tablet (10 mg total) by mouth as needed for migraine. May repeat in 2 hours if needed 9 tablet 11  . tiZANidine (ZANAFLEX) 4 MG tablet Take by mouth.     . topiramate (TOPAMAX) 100 MG tablet Take 1 tablet by mouth once daily 30 tablet 1  . venlafaxine XR (EFFEXOR-XR) 37.5 MG 24 hr capsule TAKE 1 CAPSULE BY MOUTH ONCE DAILY WITH BREAKFAST **DOSE  REDUCTION,  BEING  TAPERD  OFF** 30 capsule  0   No current facility-administered medications for this visit.    No Known Allergies  Family History  Problem Relation Age of Onset  . Diabetes Father   . Hypertension Father   . High Cholesterol Father   . Anesthesia problems Neg Hx   . Cancer Neg Hx   . Heart disease Neg Hx   . Stroke Neg Hx   . Migraines Neg Hx     Social History   Socioeconomic History  . Marital status: Single    Spouse name: Not on file  . Number of children: 3  . Years of education: Not on file  . Highest education level: Associate degree: occupational, Scientist, product/process development, or vocational program  Occupational History  . Not on file  Tobacco Use  . Smoking status: Current Every Day Smoker    Packs/day: 0.25    Types: Cigarettes  . Smokeless tobacco: Never Used    Vaping Use  . Vaping Use: Never used  Substance and Sexual Activity  . Alcohol use: Yes    Alcohol/week: 0.0 standard drinks    Comment: occasional  . Drug use: No  . Sexual activity: Yes    Birth control/protection: Injection  Other Topics Concern  . Not on file  Social History Narrative   Lives at home with Lindsay Nicholson fiance and Lindsay Nicholson children   Left handed   Caffeine: all day long   Social Determinants of Health   Financial Resource Strain:   . Difficulty of Paying Living Expenses: Not on file  Food Insecurity:   . Worried About Programme researcher, broadcasting/film/video in the Last Year: Not on file  . Ran Out of Food in the Last Year: Not on file  Transportation Needs:   . Lack of Transportation (Medical): Not on file  . Lack of Transportation (Non-Medical): Not on file  Physical Activity:   . Days of Exercise per Week: Not on file  . Minutes of Exercise per Session: Not on file  Stress:   . Feeling of Stress : Not on file  Social Connections:   . Frequency of Communication with Friends and Family: Not on file  . Frequency of Social Gatherings with Friends and Family: Not on file  . Attends Religious Services: Not on file  . Active Member of Clubs or Organizations: Not on file  . Attends Banker Meetings: Not on file  . Marital Status: Not on file  Intimate Partner Violence:   . Fear of Current or Ex-Partner: Not on file  . Emotionally Abused: Not on file  . Physically Abused: Not on file  . Sexually Abused: Not on file     Constitutional: Denies fever, malaise, fatigue, headache or abrupt weight changes.  Respiratory: Denies difficulty breathing, shortness of breath, cough or sputum production.   Cardiovascular: Denies chest pain, chest tightness, palpitations or swelling in the hands or feet.  Neurological: Pt reports "dozing" episodes. Denies dizziness, difficulty with memory, difficulty with speech or problems with balance and coordination.  Psych: Pt has a history of  anxiety and depression. Denies SI/HI.  No other specific complaints in a complete review of systems (except as listed in HPI above).  Objective:   Physical Exam  BP 110/68   Pulse 79   Temp (!) 97.5 F (36.4 C) (Temporal)   Wt 135 lb (61.2 kg)   SpO2 98%   BMI 25.51 kg/m  Wt Readings from Last 3 Encounters:  06/03/20 135 lb (61.2 kg)  02/29/20 134 lb (60.8  kg)  12/25/19 134 lb (60.8 kg)    General: Appears Lindsay Nicholson stated age, well developed, well nourished in NAD. HEENT: Head: normal shape and size; Eyes: sclera white, no icterus, conjunctiva pink, PERRLA and EOMs intact;  Neck:  No  thyromegaly present.  Cardiovascular: Normal rate. Pulmonary/Chest: Normal effort. Musculoskeletal: No difficulty with gait.  Neurological: Alert and oriented.  Coordination normal.  Psychiatric: Mood and affect flat. Behavior is normal. Judgment and thought content normal.     BMET    Component Value Date/Time   NA 142 08/27/2019 1225   K 3.6 08/27/2019 1225   CL 110 08/27/2019 1225   CO2 23 08/27/2019 1225   GLUCOSE 90 08/27/2019 1225   BUN 13 08/27/2019 1225   CREATININE 1.01 08/27/2019 1225   CALCIUM 9.6 08/27/2019 1225   GFRNONAA >60 11/16/2015 0832   GFRAA >60 11/16/2015 0832    Lipid Panel     Component Value Date/Time   CHOL 176 08/27/2019 1225   TRIG 197.0 (H) 08/27/2019 1225   HDL 38.40 (L) 08/27/2019 1225   CHOLHDL 5 08/27/2019 1225   VLDL 39.4 08/27/2019 1225   LDLCALC 98 08/27/2019 1225    CBC    Component Value Date/Time   WBC 7.2 02/06/2019 1602   RBC 4.22 02/06/2019 1602   HGB 13.4 02/06/2019 1602   HCT 39.3 02/06/2019 1602   PLT 269.0 02/06/2019 1602   MCV 93.2 02/06/2019 1602   MCH 30.5 11/16/2015 0832   MCHC 34.1 02/06/2019 1602   RDW 15.1 02/06/2019 1602   LYMPHSABS 0.9 11/16/2015 0832   MONOABS 0.8 11/16/2015 0832   EOSABS 0.0 11/16/2015 0832   BASOSABS 0.0 11/16/2015 0832    Hgb A1C No results found for: HGBA1C          Assessment &  Plan:   Excessive Daytime Sleepiness, Bipolar Depression, Marital Stress:  OSA vs narcolepsy vs somatization from depression Will refer to Dr. Lucia Gaskins for sleep study and further evaluation She should probably not be driving until she has been further evaluated by neurology She will continue to follow with Dr. Elna Breslow Support offered today

## 2020-06-17 ENCOUNTER — Telehealth (INDEPENDENT_AMBULATORY_CARE_PROVIDER_SITE_OTHER): Payer: 59 | Admitting: Psychiatry

## 2020-06-17 ENCOUNTER — Encounter: Payer: Self-pay | Admitting: Psychiatry

## 2020-06-17 ENCOUNTER — Other Ambulatory Visit: Payer: Self-pay

## 2020-06-17 DIAGNOSIS — F424 Excoriation (skin-picking) disorder: Secondary | ICD-10-CM

## 2020-06-17 DIAGNOSIS — Z91199 Patient's noncompliance with other medical treatment and regimen due to unspecified reason: Secondary | ICD-10-CM

## 2020-06-17 DIAGNOSIS — F410 Panic disorder [episodic paroxysmal anxiety] without agoraphobia: Secondary | ICD-10-CM | POA: Diagnosis not present

## 2020-06-17 DIAGNOSIS — F3161 Bipolar disorder, current episode mixed, mild: Secondary | ICD-10-CM | POA: Diagnosis not present

## 2020-06-17 DIAGNOSIS — F5105 Insomnia due to other mental disorder: Secondary | ICD-10-CM | POA: Diagnosis not present

## 2020-06-17 DIAGNOSIS — F17200 Nicotine dependence, unspecified, uncomplicated: Secondary | ICD-10-CM

## 2020-06-17 DIAGNOSIS — Z9111 Patient's noncompliance with dietary regimen: Secondary | ICD-10-CM

## 2020-06-17 MED ORDER — LURASIDONE HCL 20 MG PO TABS: 20.0000 mg | ORAL_TABLET | Freq: Every day | ORAL | 1 refills | Status: DC

## 2020-06-17 MED ORDER — LAMOTRIGINE 150 MG PO TABS: 150.0000 mg | ORAL_TABLET | Freq: Every day | ORAL | 0 refills | Status: DC

## 2020-06-17 MED ORDER — VENLAFAXINE HCL ER 37.5 MG PO CP24: ORAL_CAPSULE | ORAL | 0 refills | Status: DC

## 2020-06-17 NOTE — Progress Notes (Signed)
Provider Location : [facility] Patient Location : Work  Participants: Patient , Provider  Virtual Visit via Video Note  I connected with Lindsay Nicholson on 06/17/20 at  3:30 PM EDT by a video enabled telemedicine application and verified that I am speaking with the correct person using two identifiers.   I discussed the limitations of evaluation and management by telemedicine and the availability of in person appointments. The patient expressed understanding and agreed to proceed.   I discussed the assessment and treatment plan with the patient. The patient was provided an opportunity to ask questions and all were answered. The patient agreed with the plan and demonstrated an understanding of the instructions.   The patient was advised to call back or seek an in-person evaluation if the symptoms worsen or if the condition fails to improve as anticipated.  BH MD OP Progress Note  06/17/2020 5:19 PM Lindsay Nicholson  MRN:  [id number]  Chief Complaint:  Chief Complaint    Follow-up     HPI: Lindsay Nicholson is a 34 year old Hispanic female, lives in [place], employed, has a history of bipolar disorder, panic attacks, skin picking disorder, tobacco use disorder was evaluated by telemedicine today.  Patient today reports she is currently making progress with regards to her mood.  Since starting the Latuda her irritability has improved.  She denies side effects.  She reports she stopped taking the Elavil.  She could not give a reason as to why.  She however reports she is sleeping better at night.  She is not interested in a sleep medication.  She reports she continues to take the venlafaxine even though she was asked to stop it a month ago.  Will continue the same for now.  Patient denies any suicidality, homicidality or perceptual disturbances.  Patient with excessive sleepiness during the day has been referred for sleep study by primary care provider.  She is awaiting the same.  Patient reports she is  noncompliant with psychotherapy visits.  Patient continues to smoke cigarettes and reports worsening.  Provided counseling.  Patient denies any other concerns today.  Visit Diagnosis:    ICD-10-CM   1. Bipolar 1 disorder, mixed, mild (HCC)  F31.61 venlafaxine XR (EFFEXOR-XR) 37.5 MG 24 hr capsule    lurasidone (LATUDA) 20 MG TABS tablet  2. Panic attacks  F41.0 lamoTRIgine (LAMICTAL) 150 MG tablet  3. Insomnia due to mental disorder  F51.05   4. Skin-picking disorder  F42.4   5. Tobacco use disorder  F17.200   6. Noncompliance with treatment plan  Z91.11     Past Psychiatric History: I have reviewed past psychiatric history from my progress note on 03/15/2019.  Past trials of Wellbutrin, BuSpar, hydroxyzine, Effexor  Past Medical History:  Past Medical History:  Diagnosis Date  . Anxiety   . Cervicalgia   . SVD (spontaneous vaginal delivery) 09/14/2011    Past Surgical History:  Procedure Laterality Date  . LAPAROSCOPIC APPENDECTOMY N/A 11/16/2015   Procedure: APPENDECTOMY LAPAROSCOPIC;  Surgeon: Ovidio Kin, MD;  Location: [facility];  Service: General;  Laterality: N/A;  . SHOULDER FUSION SURGERY      Family Psychiatric History: I have reviewed family psychiatric history from my progress note on 03/15/2019  Family History:  Family History  Problem Relation Age of Onset  . Diabetes Father   . Hypertension Father   . High Cholesterol Father   . Anesthesia problems Neg Hx   . Cancer Neg Hx   . Heart disease Neg Hx   .  Stroke Neg Hx   . Migraines Neg Hx     Social History: I have reviewed social history from my progress note on 03/15/2019 Social History   Socioeconomic History  . Marital status: Single    Spouse name: Not on file  . Number of children: 3  . Years of education: Not on file  . Highest education level: Associate degree: occupational, Scientist, product/process developmenttechnical, or vocational program  Occupational History  . Not on file  Tobacco Use  . Smoking status: Current Every Day Smoker     Packs/day: 0.25    Types: Cigarettes  . Smokeless tobacco: Never Used  Vaping Use  . Vaping Use: Never used  Substance and Sexual Activity  . Alcohol use: Yes    Alcohol/week: 0.0 standard drinks    Comment: occasional  . Drug use: No  . Sexual activity: Yes    Birth control/protection: Injection  Other Topics Concern  . Not on file  Social History Narrative   Lives at home with her fiance and her children   Left handed   Caffeine: all day long   Social Determinants of Health   Financial Resource Strain:   . Difficulty of Paying Living Expenses: Not on file  Food Insecurity:   . Worried About Programme researcher, broadcasting/film/videounning Out of Food in the Last Year: Not on file  . Ran Out of Food in the Last Year: Not on file  Transportation Needs:   . Lack of Transportation (Medical): Not on file  . Lack of Transportation (Non-Medical): Not on file  Physical Activity:   . Days of Exercise per Week: Not on file  . Minutes of Exercise per Session: Not on file  Stress:   . Feeling of Stress : Not on file  Social Connections:   . Frequency of Communication with Friends and Family: Not on file  . Frequency of Social Gatherings with Friends and Family: Not on file  . Attends Religious Services: Not on file  . Active Member of Clubs or Organizations: Not on file  . Attends BankerClub or Organization Meetings: Not on file  . Marital Status: Not on file    Allergies: No Known Allergies  Metabolic Disorder Labs: No results found for: HGBA1C, MPG No results found for: PROLACTIN Lab Results  Component Value Date   CHOL 176 08/27/2019   TRIG 197.0 (H) 08/27/2019   HDL 38.40 (L) 08/27/2019   CHOLHDL 5 08/27/2019   VLDL 39.4 08/27/2019   LDLCALC 98 08/27/2019   LDLCALC 97 08/08/2017   Lab Results  Component Value Date   TSH 0.93 02/06/2019   TSH 1.02 12/03/2014    Therapeutic Level Labs: No results found for: LITHIUM No results found for: VALPROATE No components found for:  CBMZ  Current  Medications: Current Outpatient Medications  Medication Sig Dispense Refill  . lamoTRIgine (LAMICTAL) 150 MG tablet Take 1 tablet (150 mg total) by mouth daily. 90 tablet 0  . lurasidone (LATUDA) 20 MG TABS tablet Take 1 tablet (20 mg total) by mouth daily with supper. 30 tablet 1  . propranolol (INDERAL) 20 MG tablet Take 1 tablet (20 mg total) by mouth 3 (three) times daily as needed. For severe anxiety attacks 90 tablet 1  . rizatriptan (MAXALT-MLT) 10 MG disintegrating tablet Take 1 tablet (10 mg total) by mouth as needed for migraine. May repeat in 2 hours if needed 9 tablet 11  . topiramate (TOPAMAX) 100 MG tablet Take 1 tablet by mouth once daily 30 tablet 1  .  venlafaxine XR (EFFEXOR-XR) 37.5 MG 24 hr capsule TAKE 1 CAPSULE BY MOUTH ONCE DAILY WITH BREAKFAST **DOSE  REDUCTION,  BEING  TAPERD  OFF** 90 capsule 0   No current facility-administered medications for this visit.     Musculoskeletal: Strength & Muscle Tone: UTA Gait & Station: normal Patient leans: N/A  Psychiatric Specialty Exam: Review of Systems  Psychiatric/Behavioral: The patient is nervous/anxious.   All other systems reviewed and are negative.   There were no vitals taken for this visit.There is no height or weight on file to calculate BMI.  General Appearance: Casual  Eye Contact:  Fair  Speech:  Clear and Coherent  Volume:  Normal  Mood:  Anxious coping well  Affect:  Congruent  Thought Process:  Goal Directed and Descriptions of Associations: Intact  Orientation:  Full (Time, Place, and Person)  Thought Content: Logical   Suicidal Thoughts:  No  Homicidal Thoughts:  No  Memory:  Immediate;   Fair Recent;   Fair Remote;   Fair  Judgement:  Fair  Insight:  Fair  Psychomotor Activity:  Normal  Concentration:  Concentration: Fair and Attention Span: Fair  Recall:  [employer] of Knowledge: Fair  Language: Fair  Akathisia:  No  Handed:  Right  AIMS (if indicated): UTA  Assets:  Communication  Skills Desire for Improvement Housing Social Support  ADL's:  Intact  Cognition: WNL  Sleep:  Fair, does have history of excessive sleepiness during the day , which she states is improving   Screenings: PHQ2-9     Office Visit from 06/03/2020 in [facility] Visit from 08/27/2019 in [facility] Visit from 08/21/2019 in [facility] Visit from 08/09/2018 in [facility] at [facility] Visit from 03/02/2018 in [facility] at [facility] Total Score 3 1 2  0 0  PHQ-9 Total Score 7 8 7  0 --       Assessment and Plan: Lindsay Nicholson is a 34 year old Hispanic female, employed, lives in [place], has a history of bipolar disorder, panic attacks, skin picking disorder was evaluated by telemedicine today.  Patient is biologically predisposed given her history of trauma.  Patient with psychosocial stressors of the current pandemic, financial problems and her own health issues.  She reports mood symptoms is improving however she continues to be noncompliant with medications as well as therapy recommendations.  Plan as noted below.  Plan Bipolar disorder-improving Latuda 20 mg p.o. daily.  Advised patient to take it with breakfast or with supper.  She is currently not taking it with food. Lamotrigine 150 mg p.o. daily in divided dosage Continue venlafaxine extended release 37.5 mg p.o. daily.  This is a reduced dosage.  Panic attacks-improving Propranolol 20 mg p.o. 3 times daily as needed Discontinue Elavil for noncompliance Patient has been noncompliant with therapy-encouraged her to contact her therapist Mr. 20.  Skin picking disorder-improving Continue CBT  Insomnia-improving Patient has upcoming sleep study scheduled for excessive sleepiness during the day.  Tobacco use disorder-unstable Provided information for [place] quit now program.  Noncompliance with  treatment plan-patient has been noncompliant with therapy visits.  Patient also noncompliant with medication recommendations as noted above.  Encouraged compliance.  Follow-up in clinic in 4 to 5 weeks or sooner if needed.  I have spent atleast 20 minutes face to face by video with patient today. More than 50 % of the time was spent for preparing  to see the patient ( e.g., review of test, records ), ordering medications and test ,psychoeducation and supportive psychotherapy and care coordination,as well as documenting clinical information in electronic health record. This note was generated in part or whole with voice recognition software. Voice recognition is usually quite accurate but there are transcription errors that can and very often do occur. I apologize for any typographical errors that were not detected and corrected.      Jomarie Longs, MD 06/18/2020, 8:14 AM

## 2020-06-21 ENCOUNTER — Encounter: Payer: Self-pay | Admitting: Family Medicine

## 2020-06-21 ENCOUNTER — Telehealth (INDEPENDENT_AMBULATORY_CARE_PROVIDER_SITE_OTHER): Payer: 59 | Admitting: Family Medicine

## 2020-06-21 DIAGNOSIS — R197 Diarrhea, unspecified: Secondary | ICD-10-CM

## 2020-06-21 NOTE — Progress Notes (Signed)
Virtual Visit via Video Note  I connected with Lindsay Nicholson on 06/21/20 at 10:30 AM EDT by a video enabled telemedicine application and verified that I am speaking with the correct person using two identifiers.  Location: Patient: In her car Provider: [facility] Persons participating in virtual visit: Patient, provider   I discussed the limitations of evaluation and management by telemedicine and the availability of in person appointments. The patient expressed understanding and agreed to proceed.  History of Present Illness: Chief Complaint  Patient presents with  . Diarrhea    x 8 days with nausea and vomiting and chills. Pt tested for coviid 06/14/2020 results negative  . Fatigue  This is a 34 year old female who presents today for Saturday acute video visit for above chief complaint.  She has had 8 to 9 days of 3-4 watery bowel movements a day.  She has had some intermittent chills and feels warm.  She was unable to take her temperature she did not have a thermometer.  She has had a negative Covid test since symptoms started.  She reports that she is fully vaccinated against COVID-19.  She denies loss of taste or smell.  She has not been started on any new medications, denies sick contacts, no dysuria, urinary frequency.  She is urinating regularly.  She has had an occasional nighttime bowel movement.  She has some intermittent lower abdominal pain and cramping.  She has taken Tylenol and ibuprofen with some relief.  She has been able to keep down foods and fluids with 1 remote episode of vomiting.  She denies any blood or mucus in her bowel movements.  She has been eating rice, chicken, [place] fries, barbecue.  She reports that any food since her to the bathroom.    Observations/Objective: Patient is alert and answers questions appropriately.  Visible skin is unremarkable.  Respirations are even unlabored, normally conversive without increased work of breathing.  No audible wheeze or  witnessed cough.  Mood and affect are appropriate. LMP 05/13/2020 (Approximate) Comment: pt states periods are irregular Wt Readings from Last 3 Encounters:  06/03/20 135 lb (61.2 kg)  02/29/20 134 lb (60.8 kg)  12/25/19 134 lb (60.8 kg)   BP Readings from Last 3 Encounters:  06/03/20 110/68  02/29/20 116/80  12/25/19 112/75    Assessment and Plan: 1. Diarrhea of presumed infectious origin -Provided information regarding small frequent meals of bland food, small frequent amounts of liquids -Can try over-the-counter Imodium AD or Pepto-Bismol -ER/UC precautions reviewed -Information regarding bland diet sent to patient via MyChart -Out of work note for yesterday written   Olean Ree, FNP-BC  [facility], [facility]  06/21/2020 10:37 AM   Follow Up Instructions: Discussed and sent via MyChart   I discussed the assessment and treatment plan with the patient. The patient was provided an opportunity to ask questions and all were answered. The patient agreed with the plan and demonstrated an understanding of the instructions.   The patient was advised to call back or seek an in-person evaluation if the symptoms worsen or if the condition fails to improve as anticipated.    Emi Belfast, FNP

## 2020-06-26 ENCOUNTER — Telehealth: Payer: Self-pay | Admitting: Internal Medicine

## 2020-06-26 NOTE — Telephone Encounter (Signed)
LVM

## 2020-06-26 NOTE — Telephone Encounter (Signed)
Pt called in asking if [name] would be able to address gyn issues. She is wanting an appointment for discharge and odor. Should she be advised to go to urgent care? She states she can only have a late appointment as she cannot get off of work.

## 2020-07-01 NOTE — Telephone Encounter (Signed)
Left VM that it is very important that patient return our call as soon as possible.   When patient calls back please transfer her to me to discuss her dismissal.  R. Baity, NP has requested dismissal based on the patients frequent no shows and late cancellation despite being warned multiple times.  In addition, patient never returns calls creating difficulty providing her adequate communication and care.   I will attempt to call patient 1 more time, if not hearing back from her, I will then mail her a dismissal letter with details and remove Baity as PCP.

## 2020-07-03 NOTE — Telephone Encounter (Signed)
After making a 3rd attempt to reach patient, I will now be mailing dismissal letter.  No answer and I did LM to return call.

## 2020-07-10 ENCOUNTER — Institutional Professional Consult (permissible substitution): Payer: Self-pay | Admitting: Neurology

## 2020-07-10 ENCOUNTER — Encounter: Payer: Self-pay | Admitting: Neurology

## 2020-07-10 ENCOUNTER — Telehealth: Payer: Self-pay

## 2020-07-10 NOTE — Telephone Encounter (Signed)
Patient no-showed today's appointment with Dr. Athar.   

## 2020-07-15 ENCOUNTER — Other Ambulatory Visit: Payer: Self-pay | Admitting: Internal Medicine

## 2020-07-23 ENCOUNTER — Other Ambulatory Visit: Payer: Self-pay | Admitting: Internal Medicine

## 2020-08-04 ENCOUNTER — Encounter: Payer: Self-pay | Admitting: Psychiatry

## 2020-08-04 ENCOUNTER — Telehealth (INDEPENDENT_AMBULATORY_CARE_PROVIDER_SITE_OTHER): Payer: 59 | Admitting: Psychiatry

## 2020-08-04 ENCOUNTER — Other Ambulatory Visit: Payer: Self-pay

## 2020-08-04 DIAGNOSIS — F424 Excoriation (skin-picking) disorder: Secondary | ICD-10-CM | POA: Diagnosis not present

## 2020-08-04 DIAGNOSIS — Z9111 Patient's noncompliance with dietary regimen: Secondary | ICD-10-CM

## 2020-08-04 DIAGNOSIS — F5105 Insomnia due to other mental disorder: Secondary | ICD-10-CM | POA: Diagnosis not present

## 2020-08-04 DIAGNOSIS — F17200 Nicotine dependence, unspecified, uncomplicated: Secondary | ICD-10-CM

## 2020-08-04 DIAGNOSIS — F410 Panic disorder [episodic paroxysmal anxiety] without agoraphobia: Secondary | ICD-10-CM | POA: Diagnosis not present

## 2020-08-04 DIAGNOSIS — F3161 Bipolar disorder, current episode mixed, mild: Secondary | ICD-10-CM | POA: Diagnosis not present

## 2020-08-04 DIAGNOSIS — Z91199 Patient's noncompliance with other medical treatment and regimen due to unspecified reason: Secondary | ICD-10-CM

## 2020-08-04 NOTE — Progress Notes (Signed)
Virtual Visit via Video Note  I connected with Lindsay Nicholson on 08/04/20 at  2:40 PM EST by a video enabled telemedicine application and verified that I am speaking with the correct person using two identifiers.  Location Provider Location : [facility] Patient Location : Work  Participants: Patient , Provider   I discussed the limitations of evaluation and management by telemedicine and the availability of in person appointments. The patient expressed understanding and agreed to proceed. I discussed the assessment and treatment plan with the patient. The patient was provided an opportunity to ask questions and all were answered. The patient agreed with the plan and demonstrated an understanding of the instructions.  The patient was advised to call back or seek an in-person evaluation if the symptoms worsen or if the condition fails to improve as anticipated.  [facility] MD OP Progress Note  08/04/2020 3:08 PM Lindsay Nicholson  MRN:  [id number]  Chief Complaint:  Chief Complaint    Follow-up     HPI: Lindsay Nicholson is a 34 year old Asian female, lives in [place], employed, has a history of bipolar disorder, panic attacks, skin picking disorder, tobacco use disorder was evaluated by telemedicine today.  Patient today reports she is currently doing okay on the current medications.  She does have depressive symptoms at least couple of times a week.  She reports she feels sad and is socially withdrawn those days.  She however reports it does not affect her work much since she continues to work anyway.  She reports she stopped taking the Jordan a month ago.  She does not know why she did not reach out to Clinical research associate.  She reports it was expensive and costed her $80 per month.  She wants to try to get the co-pay card for Latuda prior to changing it to another medication.  She reports she is compliant on the Lamictal and the low dosage of venlafaxine.  She reports her excessive sleepiness during the day has improved a lot.   She does not feel like that anymore.  She was scheduled to get a sleep study done however she rescheduled it.  She reports sleep at night is okay.  She is noncompliant with therapy visits.  She agrees to give Mr. Montez Morita a call to restart psychotherapy sessions.  Patient denies any suicidality, homicidality or perceptual disturbances.  Patient denies any other concerns today.    Visit Diagnosis:    ICD-10-CM   1. Bipolar 1 disorder, mixed, mild (HCC)  F31.61   2. Panic attacks  F41.0   3. Insomnia due to mental disorder  F51.05   4. Skin-picking disorder  F42.4   5. Tobacco use disorder  F17.200   6. Noncompliance with treatment plan  Z91.11     Past Psychiatric History: I have reviewed past psychiatric history from my progress note from 03/15/2019.  Past trials of Wellbutrin, BuSpar, hydroxyzine, Effexor  Past Medical History:  Past Medical History:  Diagnosis Date  . Anxiety   . Cervicalgia   . SVD (spontaneous vaginal delivery) 09/14/2011    Past Surgical History:  Procedure Laterality Date  . LAPAROSCOPIC APPENDECTOMY N/A 11/16/2015   Procedure: APPENDECTOMY LAPAROSCOPIC;  Surgeon: Ovidio Kin, MD;  Location: [facility];  Service: General;  Laterality: N/A;  . SHOULDER FUSION SURGERY      Family Psychiatric History: I have reviewed family psychiatric history from my progress note on 03/15/2019  Family History:  Family History  Problem Relation Age of Onset  . Diabetes Father   .  Hypertension Father   . High Cholesterol Father   . Anesthesia problems Neg Hx   . Cancer Neg Hx   . Heart disease Neg Hx   . Stroke Neg Hx   . Migraines Neg Hx     Social History: I have reviewed social history from my progress note on 03/15/2019 Social History   Socioeconomic History  . Marital status: Single    Spouse name: Not on file  . Number of children: 3  . Years of education: Not on file  . Highest education level: Associate degree: occupational, Scientist, product/process development, or vocational program   Occupational History  . Not on file  Tobacco Use  . Smoking status: Current Every Day Smoker    Packs/day: 0.25    Types: Cigarettes  . Smokeless tobacco: Never Used  Vaping Use  . Vaping Use: Never used  Substance and Sexual Activity  . Alcohol use: Yes    Alcohol/week: 0.0 standard drinks    Comment: occasional  . Drug use: No  . Sexual activity: Yes    Birth control/protection: Injection  Other Topics Concern  . Not on file  Social History Narrative   Lives at home with her fiance and her children   Left handed   Caffeine: all day long   Social Determinants of Health   Financial Resource Strain:   . Difficulty of Paying Living Expenses: Not on file  Food Insecurity:   . Worried About Programme researcher, broadcasting/film/video in the Last Year: Not on file  . Ran Out of Food in the Last Year: Not on file  Transportation Needs:   . Lack of Transportation (Medical): Not on file  . Lack of Transportation (Non-Medical): Not on file  Physical Activity:   . Days of Exercise per Week: Not on file  . Minutes of Exercise per Session: Not on file  Stress:   . Feeling of Stress : Not on file  Social Connections:   . Frequency of Communication with Friends and Family: Not on file  . Frequency of Social Gatherings with Friends and Family: Not on file  . Attends Religious Services: Not on file  . Active Member of Clubs or Organizations: Not on file  . Attends Banker Meetings: Not on file  . Marital Status: Not on file    Allergies: No Known Allergies  Metabolic Disorder Labs: No results found for: HGBA1C, MPG No results found for: PROLACTIN Lab Results  Component Value Date   CHOL 176 08/27/2019   TRIG 197.0 (H) 08/27/2019   HDL 38.40 (L) 08/27/2019   CHOLHDL 5 08/27/2019   VLDL 39.4 08/27/2019   LDLCALC 98 08/27/2019   LDLCALC 97 08/08/2017   Lab Results  Component Value Date   TSH 0.93 02/06/2019   TSH 1.02 12/03/2014    Therapeutic Level Labs: No results found  for: LITHIUM No results found for: VALPROATE No components found for:  CBMZ  Current Medications: Current Outpatient Medications  Medication Sig Dispense Refill  . lamoTRIgine (LAMICTAL) 150 MG tablet Take 1 tablet (150 mg total) by mouth daily. 90 tablet 0  . lurasidone (LATUDA) 20 MG TABS tablet Take 1 tablet (20 mg total) by mouth daily with supper. 30 tablet 1  . propranolol (INDERAL) 20 MG tablet Take 1 tablet (20 mg total) by mouth 3 (three) times daily as needed. For severe anxiety attacks 90 tablet 1  . rizatriptan (MAXALT-MLT) 10 MG disintegrating tablet Take 1 tablet (10 mg total) by mouth as needed  for migraine. May repeat in 2 hours if needed 9 tablet 11  . topiramate (TOPAMAX) 100 MG tablet Take 1 tablet by mouth once daily 30 tablet 1  . venlafaxine XR (EFFEXOR-XR) 37.5 MG 24 hr capsule TAKE 1 CAPSULE BY MOUTH ONCE DAILY WITH BREAKFAST **DOSE  REDUCTION,  BEING  TAPERD  OFF** 90 capsule 0   No current facility-administered medications for this visit.     Musculoskeletal: Strength & Muscle Tone: UTA Gait & Station: normal Patient leans: N/A  Psychiatric Specialty Exam: Review of Systems  Psychiatric/Behavioral: Positive for dysphoric mood.       Irritable on and off  All other systems reviewed and are negative.   There were no vitals taken for this visit.There is no height or weight on file to calculate BMI.  General Appearance: Casual  Eye Contact:  Fair  Speech:  Clear and Coherent  Volume:  Normal  Mood:  Depressed on and off - couple of times a week  Affect:  Congruent, irritable on and off  Thought Process:  Goal Directed and Descriptions of Associations: Intact  Orientation:  Full (Time, Place, and Person)  Thought Content: Logical   Suicidal Thoughts:  No  Homicidal Thoughts:  No  Memory:  Immediate;   Fair Recent;   Fair Remote;   Fair  Judgement:  Fair  Insight:  Fair  Psychomotor Activity:  Normal  Concentration:  Concentration: Fair and Attention  Span: Fair  Recall:  FiservFair  Fund of Knowledge: Fair  Language: Fair  Akathisia:  No  Handed:  Right  AIMS (if indicated): uta  Assets:  Communication Skills Desire for Improvement Housing Social Support  ADL's:  Intact  Cognition: WNL  Sleep:  Fair   Screenings: PHQ2-9     [facility]  PHQ-2 Total Score 3 1 2  0 0  PHQ-9 Total Score 7 8 7  0 --       Assessment and Plan: Lindsay Nicholson is a 34 year old Hispanic female, employed, [place], has a history of bipolar disorder, panic attacks, skin picking disorder was evaluated by telemedicine today.  Patient is biologically predisposed given her history of trauma.  Patient with psychosocial stressors of current pandemic, financial problems, her own health issues.  Patient continues to be noncompliant with medications as well as therapy recommendations.  Discussed plan as noted below.  Plan Bipolar disorder-some improvement Latuda 20 mg p.o. daily-she has been noncompliant.  She however wants to try JordanLatuda co-pay card before making changes. Lamotrigine 150 mg p.o. daily in divided dosage Venlafaxine extended release 37.5 mg p.o. daily-reduced dosage.  Panic attacks-improving Propranolol 20 mg p.o. 3 times daily as needed Patient has been noncompliant with psychotherapy sessions with Mr. Montez MoritaCarter  Skin picking disorder-improving Continue CBT-she is noncompliant.  Insomnia-improving Patient was referred for sleep study-pending.  She reports she reschedule the sleep study.   Tobacco use disorder-unstable Provided education. Provided information for smoking cessation classes.  Noncompliance with  treatment plan-provided education.  Encouraged compliance.  Follow-up in clinic in 4 to 5 weeks or sooner if needed.  I have spent atleast 20 minutes face to face by video with patient today. More than 50 % of the time was spent  for preparing to see the patient ( e.g., review of test, records ), ordering medications and test ,psychoeducation and supportive psychotherapy and care coordination,as well as documenting clinical information in electronic health record. This note was generated in part or whole with voice recognition software. Voice recognition is usually quite accurate but there are transcription errors that can and very often do occur. I apologize for any typographical errors that were not detected and corrected.        Jomarie Longs, MD 08/04/2020, 3:08 PM

## 2020-08-20 ENCOUNTER — Telehealth: Payer: Self-pay | Admitting: Psychiatry

## 2020-08-20 NOTE — Telephone Encounter (Signed)
Attempted to contact patient, left voicemail to discuss medication changes and to know if she is currently taking the Jordan since she had trouble filling the prescription last visit.  She was supposed to check with co-pay card options and was supposed to let writer know. Left voicemail to contact writer back.

## 2020-08-28 ENCOUNTER — Encounter: Payer: Commercial Managed Care - PPO | Admitting: Internal Medicine

## 2020-09-03 ENCOUNTER — Ambulatory Visit (INDEPENDENT_AMBULATORY_CARE_PROVIDER_SITE_OTHER): Payer: 59 | Admitting: Psychiatry

## 2020-09-03 ENCOUNTER — Encounter: Payer: Self-pay | Admitting: Psychiatry

## 2020-09-03 ENCOUNTER — Other Ambulatory Visit: Payer: Self-pay

## 2020-09-03 VITALS — BP 120/83 | HR 97 | Temp 98.8°F | Ht 61.5 in | Wt 134.4 lb

## 2020-09-03 DIAGNOSIS — F17200 Nicotine dependence, unspecified, uncomplicated: Secondary | ICD-10-CM

## 2020-09-03 DIAGNOSIS — F3161 Bipolar disorder, current episode mixed, mild: Secondary | ICD-10-CM | POA: Diagnosis not present

## 2020-09-03 DIAGNOSIS — Z9189 Other specified personal risk factors, not elsewhere classified: Secondary | ICD-10-CM

## 2020-09-03 DIAGNOSIS — F424 Excoriation (skin-picking) disorder: Secondary | ICD-10-CM

## 2020-09-03 DIAGNOSIS — F410 Panic disorder [episodic paroxysmal anxiety] without agoraphobia: Secondary | ICD-10-CM

## 2020-09-03 DIAGNOSIS — F5105 Insomnia due to other mental disorder: Secondary | ICD-10-CM

## 2020-09-03 MED ORDER — LURASIDONE HCL 40 MG PO TABS: 40.0000 mg | ORAL_TABLET | Freq: Every day | ORAL | 1 refills | Status: DC

## 2020-09-03 MED ORDER — CHANTIX STARTING MONTH PAK 0.5 MG X 11 & 1 MG X 42 PO TABS: ORAL_TABLET | ORAL | 0 refills | Status: AC

## 2020-09-03 MED ORDER — MIRTAZAPINE 15 MG PO TABS: 7.5000 mg | ORAL_TABLET | Freq: Every evening | ORAL | 2 refills | Status: AC | PRN

## 2020-09-03 NOTE — Patient Instructions (Addendum)
Mirtazapine tablets What is this medicine? MIRTAZAPINE (mir TAZ a peen) is used to treat depression. This medicine may be used for other purposes; ask your health care provider or pharmacist if you have questions. COMMON BRAND NAME(S): Remeron What should I tell my health care provider before I take this medicine? They need to know if you have any of these conditions:  bipolar disorder  glaucoma  kidney disease  liver disease  suicidal thoughts  an unusual or allergic reaction to mirtazapine, other medicines, foods, dyes, or preservatives  pregnant or trying to get pregnant  breast-feeding How should I use this medicine? Take this medicine by mouth with a glass of water. Follow the directions on the prescription label. Take your medicine at regular intervals. Do not take your medicine more often than directed. Do not stop taking this medicine suddenly except upon the advice of your doctor. Stopping this medicine too quickly may cause serious side effects or your condition may worsen. A special MedGuide will be given to you by the pharmacist with each prescription and refill. Be sure to read this information carefully each time. Talk to your pediatrician regarding the use of this medicine in children. Special care may be needed. Overdosage: If you think you have taken too much of this medicine contact a poison control center or emergency room at once. NOTE: This medicine is only for you. Do not share this medicine with others. What if I miss a dose? If you miss a dose, take it as soon as you can. If it is almost time for your next dose, take only that dose. Do not take double or extra doses. What may interact with this medicine? Do not take this medicine with any of the following medications:  linezolid  MAOIs like Carbex, Eldepryl, Marplan, Nardil, and Parnate  methylene blue (injected into a vein) This medicine may also interact with the following  medications:  alcohol  antiviral medicines for HIV or AIDS  certain medicines that treat or prevent blood clots like warfarin  certain medicines for depression, anxiety, or psychotic disturbances  certain medicines for fungal infections like ketoconazole and itraconazole  certain medicines for migraine headache like almotriptan, eletriptan, frovatriptan, naratriptan, rizatriptan, sumatriptan, zolmitriptan  certain medicines for seizures like carbamazepine or phenytoin  certain medicines for sleep  cimetidine  erythromycin  fentanyl  lithium  medicines for blood pressure  nefazodone  rasagiline  rifampin  supplements like St. John's wort, kava kava, valerian  tramadol  tryptophan This list may not describe all possible interactions. Give your health care provider a list of all the medicines, herbs, non-prescription drugs, or dietary supplements you use. Also tell them if you smoke, drink alcohol, or use illegal drugs. Some items may interact with your medicine. What should I watch for while using this medicine? Tell your doctor if your symptoms do not get better or if they get worse. Visit your doctor or health care professional for regular checks on your progress. Because it may take several weeks to see the full effects of this medicine, it is important to continue your treatment as prescribed by your doctor. Patients and their families should watch out for new or worsening thoughts of suicide or depression. Also watch out for sudden changes in feelings such as feeling anxious, agitated, panicky, irritable, hostile, aggressive, impulsive, severely restless, overly excited and hyperactive, or not being able to sleep. If this happens, especially at the beginning of treatment or after a change in dose, call your health   care professional. Bonita Quin may get drowsy or dizzy. Do not drive, use machinery, or do anything that needs mental alertness until you know how this medicine  affects you. Do not stand or sit up quickly, especially if you are an older patient. This reduces the risk of dizzy or fainting spells. Alcohol may interfere with the effect of this medicine. Avoid alcoholic drinks. This medicine may cause dry eyes and blurred vision. If you wear contact lenses you may feel some discomfort. Lubricating drops may help. See your eye doctor if the problem does not go away or is severe. Your mouth may get dry. Chewing sugarless gum or sucking hard candy, and drinking plenty of water may help. Contact your doctor if the problem does not go away or is severe. What side effects may I notice from receiving this medicine? Side effects that you should report to your doctor or health care professional as soon as possible:  allergic reactions like skin rash, itching or hives, swelling of the face, lips, or tongue  anxious  changes in vision  chest pain  confusion  elevated mood, decreased need for sleep, racing thoughts, impulsive behavior  eye pain  fast, irregular heartbeat  feeling faint or lightheaded, falls  feeling agitated, angry, or irritable  fever or chills, sore throat  hallucination, loss of contact with reality  loss of balance or coordination  mouth sores  redness, blistering, peeling or loosening of the skin, including inside the mouth  restlessness, pacing, inability to keep still  seizures  stiff muscles  suicidal thoughts or other mood changes  trouble passing urine or change in the amount of urine  trouble sleeping  unusual bleeding or bruising  unusually weak or tired  vomiting Side effects that usually do not require medical attention (report to your doctor or health care professional if they continue or are bothersome):  change in appetite  constipation  dizziness  dry mouth  muscle aches or pains  nausea  tired  weight gain This list may not describe all possible side effects. Call your doctor for  medical advice about side effects. You may report side effects to FDA at 1-800-FDA-1088. Where should I keep my medicine? Keep out of the reach of children. Store at room temperature between 15 and 30 degrees C (59 and 86 degrees F) Protect from light and moisture. Throw away any unused medicine after the expiration date. NOTE: This sheet is a summary. It may not cover all possible information. If you have questions about this medicine, talk to your doctor, pharmacist, or health care provider.  2020 [employer]/Gold Standard (2016-01-29 17:30:45) Varenicline oral tablets What is this medicine? VARENICLINE (var e NI kleen) is used to help people quit smoking. It is used with a patient support program recommended by your physician. This medicine may be used for other purposes; ask your health care provider or pharmacist if you have questions. COMMON BRAND NAME(S): Chantix What should I tell my health care provider before I take this medicine? They need to know if you have any of these conditions:  heart disease  if you often drink alcohol  kidney disease  mental illness  on hemodialysis  seizures  history of stroke  suicidal thoughts, plans, or attempt; a previous suicide attempt by you or a family member  an unusual or allergic reaction to varenicline, other medicines, foods, dyes, or preservatives  pregnant or trying to get pregnant  breast-feeding How should I use this medicine? Take this medicine by mouth  after eating. Take with a full glass of water. Follow the directions on the prescription label. Take your doses at regular intervals. Do not take your medicine more often than directed. There are 3 ways you can use this medicine to help you quit smoking; talk to your health care professional to decide which plan is right for you: 1) you can choose a quit date and start this medicine 1 week before the quit date, or, 2) you can start taking this medicine before you choose a quit  date, and then pick a quit date between day 8 and 35 days of treatment, or, 3) if you are not sure that you are able or willing to quit smoking right away, start taking this medicine and slowly decrease the amount you smoke as directed by your health care professional with the goal of being cigarette-free by week 12 of treatment. Stick to your plan; ask about support groups or other ways to help you remain cigarette-free. If you are motivated to quit smoking and did not succeed during a previous attempt with this medicine for reasons other than side effects, or if you returned to smoking after this treatment, speak with your health care professional about whether another course of this medicine may be right for you. A special MedGuide will be given to you by the pharmacist with each prescription and refill. Be sure to read this information carefully each time. Talk to your pediatrician regarding the use of this medicine in children. This medicine is not approved for use in children. Overdosage: If you think you have taken too much of this medicine contact a poison control center or emergency room at once. NOTE: This medicine is only for you. Do not share this medicine with others. What if I miss a dose? If you miss a dose, take it as soon as you can. If it is almost time for your next dose, take only that dose. Do not take double or extra doses. What may interact with this medicine?  alcohol  insulin  other medicines used to help people quit smoking  theophylline  warfarin This list may not describe all possible interactions. Give your health care provider a list of all the medicines, herbs, non-prescription drugs, or dietary supplements you use. Also tell them if you smoke, drink alcohol, or use illegal drugs. Some items may interact with your medicine. What should I watch for while using this medicine? It is okay if you do not succeed at your attempt to quit and have a cigarette. You can  still continue your quit attempt and keep using this medicine as directed. Just throw away your cigarettes and get back to your quit plan. Talk to your health care provider before using other treatments to quit smoking. Using this medicine with other treatments to quit smoking may increase the risk for side effects compared to using a treatment alone. You may get drowsy or dizzy. Do not drive, use machinery, or do anything that needs mental alertness until you know how this medicine affects you. Do not stand or sit up quickly, especially if you are an older patient. This reduces the risk of dizzy or fainting spells. Decrease the number of alcoholic beverages that you drink during treatment with this medicine until you know if this medicine affects your ability to tolerate alcohol. Some people have experienced increased drunkenness (intoxication), unusual or sometimes aggressive behavior, or no memory of things that have happened (amnesia) during treatment with this medicine. Sleepwalking can happen  during treatment with this medicine, and can sometimes lead to behavior that is harmful to you, other people, or property. Stop taking this medicine and tell your doctor if you start sleepwalking or have other unusual sleep-related activity. After taking this medicine, you may get up out of bed and do an activity that you do not know you are doing. The next morning, you may have no memory of this. Activities include driving a car ("sleep-driving"), making and eating food, talking on the phone, sexual activity, and sleep-walking. Serious injuries have occurred. Stop the medicine and call your doctor right away if you find out you have done any of these activities. Do not take this medicine if you have used alcohol that evening. Do not take it if you have taken another medicine for sleep. The risk of doing these sleep-related activities is higher. Patients and their families should watch out for new or worsening  depression or thoughts of suicide. Also watch out for sudden changes in feelings such as feeling anxious, agitated, panicky, irritable, hostile, aggressive, impulsive, severely restless, overly excited and hyperactive, or not being able to sleep. If this happens, call your health care professional. If you have diabetes and you quit smoking, the effects of insulin may be increased and you may need to reduce your insulin dose. Check with your doctor or health care professional about how you should adjust your insulin dose. What side effects may I notice from receiving this medicine? Side effects that you should report to your doctor or health care professional as soon as possible:  allergic reactions like skin rash, itching or hives, swelling of the face, lips, tongue, or throat  acting aggressive, being angry or violent, or acting on dangerous impulses  breathing problems  changes in emotions or moods  chest pain or chest tightness  feeling faint or lightheaded, falls  hallucination, loss of contact with reality  mouth sores  redness, blistering, peeling or loosening of the skin, including inside the mouth  signs and symptoms of a stroke like changes in vision; confusion; trouble speaking or understanding; severe headaches; sudden numbness or weakness of the face, arm or leg; trouble walking; dizziness; loss of balance or coordination  seizures  sleepwalking  suicidal thoughts or other mood changes Side effects that usually do not require medical attention (report to your doctor or health care professional if they continue or are bothersome):  constipation  gas  headache  nausea, vomiting  strange dreams  trouble sleeping This list may not describe all possible side effects. Call your doctor for medical advice about side effects. You may report side effects to FDA at 1-800-FDA-1088. Where should I keep my medicine? Keep out of the reach of children. Store at room  temperature between 15 and 30 degrees C (59 and 86 degrees F). Throw away any unused medicine after the expiration date. NOTE: This sheet is a summary. It may not cover all possible information. If you have questions about this medicine, talk to your doctor, pharmacist, or health care provider.  2020 [employer]/Gold Standard (2018-08-18 14:27:36)

## 2020-09-03 NOTE — Progress Notes (Signed)
BH MD OP Progress Note  09/03/2020 6:49 PM Lindsay Nicholson  MRN:  [id number]  Chief Complaint:  Chief Complaint    Follow-up     HPI: Lindsay Nicholson is a 34 year old Asian female, [place] in [place], employed, has a history of bipolar disorder, panic attacks, skin picking disorder, tobacco use disorder, was evaluated in the office in person today.  Patient today reports that she continues to struggle with irritability, crying spells, sadness, social withdrawal, lack of motivation although her symptoms are improving on the JordanLatuda.  She reports she was finally able to get the Latuda filled at her pharmacy.  She was able to make use of coupons.  She reports she currently does not have any side effects to the JordanLatuda.  She is interested in dosage increase.  Patient reports she continues to struggle with sleep.  She reports she sleeps 6 hours or so however it is interrupted.  She is interested in a sleep medication.  Discussed mirtazapine, she is agreeable to a trial.  Patient reports she currently does not have any auditory or visual hallucinations.  She reports she had hallucinations when she was living at her previous residence.  Since she moved to the new home she does not have any perceptual disturbances.  She continues to have skin picking, patient does have a current lesion on her face due to picking.  She agrees to discuss this with her therapist.  Patient today reports she is currently struggling with relationship struggles with her boyfriend.  She reports she and her boyfriend are separated since June 2021.  Although they are separated they continue to live in the same house.  Patient reports long-term plan is to get separated residence if possible.  Patient reports she is motivated to stay in therapy and has upcoming appointment with Mr. Montez MoritaCarter tomorrow.  Patient denies any suicidality or homicidality.  Patient denies abusing any alcohol, cannabis or any other illicit drugs at this  time.  Patient continues to smoke cigarettes and reports she is interested in smoking cessation.  She is interested in starting Chantix.  Patient denies any other concerns today.  Visit Diagnosis:    ICD-10-CM   1. Bipolar 1 disorder, mixed, mild (HCC)  F31.61 lurasidone (LATUDA) 40 MG TABS tablet  2. Panic attacks  F41.0   3. Insomnia due to mental disorder  F51.05 mirtazapine (REMERON) 15 MG tablet  4. Skin-picking disorder  F42.4   5. Tobacco use disorder  F17.200 varenicline (CHANTIX STARTING MONTH PAK) 0.5 MG X 11 & 1 MG X 42 tablet  6. At risk for prolonged QT interval syndrome  Z91.89 EKG 12-Lead    Past Psychiatric History: I have reviewed past psychiatric history from my progress note from 03/15/2019.  Past trials of Wellbutrin, BuSpar, hydroxyzine, Effexor  Past Medical History:  Past Medical History:  Diagnosis Date  . Anxiety   . Cervicalgia   . SVD (spontaneous vaginal delivery) 09/14/2011    Past Surgical History:  Procedure Laterality Date  . LAPAROSCOPIC APPENDECTOMY N/A 11/16/2015   Procedure: APPENDECTOMY LAPAROSCOPIC;  Surgeon: Ovidio Kinavid Newman, MD;  Location: [facility];  Service: General;  Laterality: N/A;  . SHOULDER FUSION SURGERY      Family Psychiatric History: I have reviewed family psychiatric history from my progress note on 03/15/2019  Family History:  Family History  Problem Relation Age of Onset  . Diabetes Father   . Hypertension Father   . High Cholesterol Father   . Anesthesia problems Neg Hx   .  Cancer Neg Hx   . Heart disease Neg Hx   . Stroke Neg Hx   . Migraines Neg Hx     Social History: I have reviewed social history from my progress note from 03/15/2019 Social History   Socioeconomic History  . Marital status: Single    Spouse name: Not on file  . Number of children: 3  . Years of education: Not on file  . Highest education level: Associate degree: occupational, Scientist, product/process development, or vocational program  Occupational History  . Not on file   Tobacco Use  . Smoking status: Current Every Day Smoker    Packs/day: 0.25    Years: 15.00    Pack years: 3.75    Types: Cigarettes  . Smokeless tobacco: Never Used  Vaping Use  . Vaping Use: Never used  Substance and Sexual Activity  . Alcohol use: Yes    Alcohol/week: 0.0 standard drinks    Comment: occasional  . Drug use: No  . Sexual activity: Yes    Partners: Male    Birth control/protection: Injection, Implant  Other Topics Concern  . Not on file  Social History Narrative   Lives at home with her fiance and her children   Left handed   Caffeine: all day long   Social Determinants of Health   Financial Resource Strain: Not on file  Food Insecurity: Not on file  Transportation Needs: Not on file  Physical Activity: Not on file  Stress: Not on file  Social Connections: Not on file    Allergies: No Known Allergies  Metabolic Disorder Labs: No results found for: HGBA1C, MPG No results found for: PROLACTIN Lab Results  Component Value Date   CHOL 176 08/27/2019   TRIG 197.0 (H) 08/27/2019   HDL 38.40 (L) 08/27/2019   CHOLHDL 5 08/27/2019   VLDL 39.4 08/27/2019   LDLCALC 98 08/27/2019   LDLCALC 97 08/08/2017   Lab Results  Component Value Date   TSH 0.93 02/06/2019   TSH 1.02 12/03/2014    Therapeutic Level Labs: No results found for: LITHIUM No results found for: VALPROATE No components found for:  CBMZ  Current Medications: Current Outpatient Medications  Medication Sig Dispense Refill  . lamoTRIgine (LAMICTAL) 150 MG tablet Take 1 tablet (150 mg total) by mouth daily. 90 tablet 0  . propranolol (INDERAL) 20 MG tablet Take 1 tablet (20 mg total) by mouth 3 (three) times daily as needed. For severe anxiety attacks 90 tablet 1  . rizatriptan (MAXALT-MLT) 10 MG disintegrating tablet Take 1 tablet (10 mg total) by mouth as needed for migraine. May repeat in 2 hours if needed 9 tablet 11  . topiramate (TOPAMAX) 100 MG tablet Take 1 tablet by mouth once  daily 30 tablet 1  . venlafaxine XR (EFFEXOR-XR) 37.5 MG 24 hr capsule TAKE 1 CAPSULE BY MOUTH ONCE DAILY WITH BREAKFAST **DOSE  REDUCTION,  BEING  TAPERD  OFF** 90 capsule 0  . lurasidone (LATUDA) 40 MG TABS tablet Take 1 tablet (40 mg total) by mouth daily with supper. 30 tablet 1  . mirtazapine (REMERON) 15 MG tablet Take 0.5-1 tablets (7.5-15 mg total) by mouth at bedtime as needed. 30 tablet 2  . varenicline (CHANTIX STARTING MONTH PAK) 0.5 MG X 11 & 1 MG X 42 tablet Take one 0.5 mg tablet by mouth once daily for 3 days, then increase to one 0.5 mg tablet twice daily for 4 days, then increase to one 1 mg tablet twice daily. 53 tablet 0  No current facility-administered medications for this visit.     Musculoskeletal: Strength & Muscle Tone: UTA Gait & Station: normal Patient leans: N/A  Psychiatric Specialty Exam: Review of Systems  Skin:       Lesion present right above upper lip - likely from skin picking  Psychiatric/Behavioral: Positive for dysphoric mood and sleep disturbance.  All other systems reviewed and are negative.   Blood pressure 120/83, pulse 97, temperature 98.8 F (37.1 C), height 5' 1.5" (1.562 m), weight 134 lb 6.4 oz (61 kg), SpO2 97 %.Body mass index is 24.98 kg/m.  General Appearance: Casual  Eye Contact:  Fair  Speech:  Clear and Coherent  Volume:  Normal  Mood:  Depressed and Irritable  Affect:  Congruent  Thought Process:  Goal Directed and Descriptions of Associations: Intact  Orientation:  Full (Time, Place, and Person)  Thought Content: Rumination   Suicidal Thoughts:  No  Homicidal Thoughts:  No  Memory:  Immediate;   Fair Recent;   Fair Remote;   Fair  Judgement:  Fair  Insight:  Fair  Psychomotor Activity:  Normal  Concentration:  Concentration: Fair and Attention Span: Fair  Recall:  [employer] of Knowledge: Fair  Language: Fair  Akathisia:  No  Handed:  Right  AIMS (if indicated): 0  Assets:  Communication Skills Desire for  Improvement Housing Social Support Talents/Skills Transportation Vocational/Educational  ADL's:  Intact  Cognition: WNL  Sleep:  Poor   Screenings: Secondary school teacher [place] Office Visit from 06/03/2020 in [facility] Visit from 08/27/2019 in [facility] Visit from 08/21/2019 in [facility] Office Visit from 08/09/2018 in [facility] Visit from 03/02/2018 in [facility] at [facility] Total Score 3 1 2  0 0  PHQ-9 Total Score 7 8 7  0 --       Assessment and Plan: Cniyah Sproull is a 34 year old Asian female, employed, lives in [place], has a history of bipolar disorder, panic attacks, skin picking disorder was evaluated in the office in person today.  Patient is biologically predisposed given her history of trauma.  Patient with psychosocial stressors of relationship struggles with her boyfriend, financial problems, current pandemic, health issues.  Patient continues to struggle with irritability, sleep problems, skin picking and will benefit from medication readjustment and psychotherapy sessions.  Plan Bipolar disorder-unstable Increase Latuda to 40 mg p.o. daily with supper Continue Lamictal 150 mg p.o. daily divided dosage Venlafaxine extended release 37.5 mg p.o. daily-reduced dosage.  Panic attacks-improving Propranolol 20 mg p.o. 3 times daily as needed Patient has upcoming appointment with Mr. 31 tomorrow, is motivated to keep the appointment.  Skin picking disorder-some progress She will continue CBT. Venlafaxine will also help.   Insomnia-unstable Start mirtazapine 7.5-15 mg p.o. nightly as needed Pending sleep study  Tobacco use disorder-unstable Start Chantix starter pack.  She will start 0.5 mg once daily for 3 days, increase to 0.5 mg twice daily for 4 days and then increase to 1 mg twice daily.  At risk for prolonged QT  syndrome-will order EKG.   Follow-up in clinic in 4 weeks or sooner if needed.  I have spent atleast 30 minutes face to face with patient today. More than 50 % of the time was spent for preparing to see the patient ( e.g., review of test, records ), obtaining and to review and separately obtained history , ordering medications  and test ,psychoeducation and supportive psychotherapy and care coordination,as well as documenting clinical information in electronic health record. This note was generated in part or whole with voice recognition software. Voice recognition is usually quite accurate but there are transcription errors that can and very often do occur. I apologize for any typographical errors that were not detected and corrected.         Jomarie Longs, MD 09/04/2020, 9:27 AM

## 2020-09-04 ENCOUNTER — Telehealth: Payer: Self-pay | Admitting: *Deleted

## 2020-09-04 ENCOUNTER — Ambulatory Visit (INDEPENDENT_AMBULATORY_CARE_PROVIDER_SITE_OTHER): Payer: 59 | Admitting: Clinical

## 2020-09-04 DIAGNOSIS — F410 Panic disorder [episodic paroxysmal anxiety] without agoraphobia: Secondary | ICD-10-CM

## 2020-09-04 DIAGNOSIS — F3161 Bipolar disorder, current episode mixed, mild: Secondary | ICD-10-CM | POA: Diagnosis not present

## 2020-09-04 DIAGNOSIS — F5105 Insomnia due to other mental disorder: Secondary | ICD-10-CM | POA: Diagnosis not present
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
## 2020-09-04 NOTE — Telephone Encounter (Signed)
Call to patient regarding EKG. Patient told to call [facility] to arrange.

## 2020-09-25 ENCOUNTER — Ambulatory Visit (HOSPITAL_COMMUNITY): Payer: 59 | Admitting: Clinical

## 2020-09-25 ENCOUNTER — Telehealth (HOSPITAL_COMMUNITY): Payer: Self-pay | Admitting: Clinical

## 2020-09-25 ENCOUNTER — Other Ambulatory Visit: Payer: Self-pay

## 2020-09-25 NOTE — Telephone Encounter (Signed)
Pt did not respond to video link, call, or VM 

## 2020-10-01 ENCOUNTER — Other Ambulatory Visit: Payer: Self-pay | Admitting: Psychiatry

## 2020-10-01 DIAGNOSIS — F3161 Bipolar disorder, current episode mixed, mild: Secondary | ICD-10-CM

## 2020-10-08 ENCOUNTER — Encounter: Payer: Self-pay | Admitting: Psychiatry

## 2020-10-08 ENCOUNTER — Telehealth (INDEPENDENT_AMBULATORY_CARE_PROVIDER_SITE_OTHER): Payer: 59 | Admitting: Psychiatry

## 2020-10-08 ENCOUNTER — Other Ambulatory Visit: Payer: Self-pay

## 2020-10-08 DIAGNOSIS — F3161 Bipolar disorder, current episode mixed, mild: Secondary | ICD-10-CM

## 2020-10-08 DIAGNOSIS — F424 Excoriation (skin-picking) disorder: Secondary | ICD-10-CM | POA: Diagnosis not present

## 2020-10-08 DIAGNOSIS — F17200 Nicotine dependence, unspecified, uncomplicated: Secondary | ICD-10-CM

## 2020-10-08 DIAGNOSIS — F410 Panic disorder [episodic paroxysmal anxiety] without agoraphobia: Secondary | ICD-10-CM

## 2020-10-08 DIAGNOSIS — G2571 Drug induced akathisia: Secondary | ICD-10-CM

## 2020-10-08 DIAGNOSIS — Z9189 Other specified personal risk factors, not elsewhere classified: Secondary | ICD-10-CM

## 2020-10-08 MED ORDER — LURASIDONE HCL 40 MG PO TABS: 20.0000 mg | ORAL_TABLET | Freq: Every day | ORAL | 1 refills | Status: AC

## 2020-10-08 MED ORDER — LAMOTRIGINE 150 MG PO TABS: 150.0000 mg | ORAL_TABLET | Freq: Every day | ORAL | 0 refills | Status: AC

## 2020-10-08 NOTE — Progress Notes (Unsigned)
Virtual Visit via Video Note  I connected with Lindsay Nicholson on 10/08/20 at  3:00 PM EST by a video enabled telemedicine application and verified that I am speaking with the correct person using two identifiers.  Location Provider Location : [facility] Patient Location : Home  Participants: Patient , Provider   I discussed the limitations of evaluation and management by telemedicine and the availability of in person appointments. The patient expressed understanding and agreed to proceed.    I discussed the assessment and treatment plan with the patient. The patient was provided an opportunity to ask questions and all were answered. The patient agreed with the plan and demonstrated an understanding of the instructions.   The patient was advised to call back or seek an in-person evaluation if the symptoms worsen or if the condition fails to improve as anticipated.   BH MD OP Progress Note  10/08/2020 3:29 PM Lindsay Nicholson  MRN:  [id number]  Chief Complaint:  Chief Complaint    Follow-up     HPI: Lindsay Nicholson is a 35 year old Asian female, lives in [place], employed, has a history of bipolar disorder, panic attacks, skin picking disorder, tobacco use disorder was evaluated by telemedicine today.  Patient today reports overall she is doing okay with regards to her mood.  Denies any significant mood swings.  Denies crying spells.  Patient reports sleep is okay.  She reports her mirtazapine is beneficial.  Patient denies any suicidality, homicidality or perceptual disturbances.  She denies any skin picking symptoms at this time.  Patient reports she missed an appointment with her therapist however agrees to get in touch soon.  Patient reports she has side effects of feeling restless when she takes her medications.  She is not sure which one could be contributing to it.  Patient denies any substance abuse problems.  She reports work is affected due to her recent restlessness.  Patient has  not been able to start Chantix yet.  She reports she has had some difficulty filling it at the pharmacy.  She however has been cutting back on smoking cigarettes as much as she can.  Patient denies any other concerns today.    Visit Diagnosis:    ICD-10-CM   1. Bipolar 1 disorder, mixed, mild (HCC)  F31.61 lurasidone (LATUDA) 40 MG TABS tablet  2. Panic attacks  F41.0 lamoTRIgine (LAMICTAL) 150 MG tablet  3. Skin-picking disorder  F42.4   4. Tobacco use disorder  F17.200   5. At risk for prolonged QT interval syndrome  Z91.89   6. Drug induced akathisia  G25.71    Likely due to [place]    Past Psychiatric History: I have reviewed past psychiatric history from my progress note on 03/15/2019.  Past trials of Wellbutrin, BuSpar, hydroxyzine, Effexor  Past Medical History:  Past Medical History:  Diagnosis Date  . Anxiety   . Cervicalgia   . SVD (spontaneous vaginal delivery) 09/14/2011    Past Surgical History:  Procedure Laterality Date  . LAPAROSCOPIC APPENDECTOMY N/A 11/16/2015   Procedure: APPENDECTOMY LAPAROSCOPIC;  Surgeon: Ovidio Kin, MD;  Location: [facility];  Service: General;  Laterality: N/A;  . SHOULDER FUSION SURGERY      Family Psychiatric History: I have reviewed family psychiatric history from my progress note on 03/15/2019  Family History:  Family History  Problem Relation Age of Onset  . Diabetes Father   . Hypertension Father   . High Cholesterol Father   . Anesthesia problems Neg Hx   .  Cancer Neg Hx   . Heart disease Neg Hx   . Stroke Neg Hx   . Migraines Neg Hx     Social History: Reviewed social history from my progress note on 03/15/2019 Social History   Socioeconomic History  . Marital status: Single    Spouse name: Not on file  . Number of children: 3  . Years of education: Not on file  . Highest education level: Associate degree: occupational, Scientist, product/process development, or vocational program  Occupational History  . Not on file  Tobacco Use  . Smoking status:  Current Every Day Smoker    Packs/day: 0.25    Years: 15.00    Pack years: 3.75    Types: Cigarettes  . Smokeless tobacco: Never Used  Vaping Use  . Vaping Use: Never used  Substance and Sexual Activity  . Alcohol use: Yes    Alcohol/week: 0.0 standard drinks    Comment: occasional  . Drug use: No  . Sexual activity: Yes    Partners: Male    Birth control/protection: Injection, Implant  Other Topics Concern  . Not on file  Social History Narrative   Lives at home with her fiance and her children   Left handed   Caffeine: all day long   Social Determinants of Health   Financial Resource Strain: Not on file  Food Insecurity: Not on file  Transportation Needs: Not on file  Physical Activity: Not on file  Stress: Not on file  Social Connections: Not on file    Allergies: No Known Allergies  Metabolic Disorder Labs: No results found for: HGBA1C, MPG No results found for: PROLACTIN Lab Results  Component Value Date   CHOL 176 08/27/2019   TRIG 197.0 (H) 08/27/2019   HDL 38.40 (L) 08/27/2019   CHOLHDL 5 08/27/2019   VLDL 39.4 08/27/2019   LDLCALC 98 08/27/2019   LDLCALC 97 08/08/2017   Lab Results  Component Value Date   TSH 0.93 02/06/2019   TSH 1.02 12/03/2014    Therapeutic Level Labs: No results found for: LITHIUM No results found for: VALPROATE No components found for:  CBMZ  Current Medications: Current Outpatient Medications  Medication Sig Dispense Refill  . lamoTRIgine (LAMICTAL) 150 MG tablet Take 1 tablet (150 mg total) by mouth daily. 90 tablet 0  . lurasidone (LATUDA) 40 MG TABS tablet Take 0.5 tablets (20 mg total) by mouth daily with supper. 30 tablet 1  . mirtazapine (REMERON) 15 MG tablet Take 0.5-1 tablets (7.5-15 mg total) by mouth at bedtime as needed. 30 tablet 2  . propranolol (INDERAL) 20 MG tablet Take 1 tablet (20 mg total) by mouth 3 (three) times daily as needed. For severe anxiety attacks 90 tablet 1  . rizatriptan (MAXALT-MLT) 10  MG disintegrating tablet Take 1 tablet (10 mg total) by mouth as needed for migraine. May repeat in 2 hours if needed 9 tablet 11  . topiramate (TOPAMAX) 100 MG tablet Take 1 tablet by mouth once daily 30 tablet 1  . varenicline (CHANTIX STARTING MONTH PAK) 0.5 MG X 11 & 1 MG X 42 tablet Take one 0.5 mg tablet by mouth once daily for 3 days, then increase to one 0.5 mg tablet twice daily for 4 days, then increase to one 1 mg tablet twice daily. 53 tablet 0  . venlafaxine XR (EFFEXOR-XR) 37.5 MG 24 hr capsule TAKE 1 CAPSULE BY MOUTH WITH BREAKFAST **DOSE  REDUCTION,  BEING  TAPERD  OFF** 90 capsule 0   No current  facility-administered medications for this visit.     Musculoskeletal: Strength & Muscle Tone: UTA Gait & Station: UTA Patient leans: N/A  Psychiatric Specialty Exam: Review of Systems  Psychiatric/Behavioral: The patient is nervous/anxious.   All other systems reviewed and are negative.   There were no vitals taken for this visit.There is no height or weight on file to calculate BMI.  General Appearance: Casual  Eye Contact:  Fair  Speech:  Clear and Coherent  Volume:  Normal  Mood:  Anxious  Affect:  Congruent  Thought Process:  Goal Directed and Descriptions of Associations: Intact  Orientation:  Full (Time, Place, and Person)  Thought Content: Logical   Suicidal Thoughts:  No  Homicidal Thoughts:  No  Memory:  Immediate;   Fair Recent;   Fair Remote;   Fair  Judgement:  Fair  Insight:  Shallow  Psychomotor Activity:  Normal  Concentration:  Concentration: Fair and Attention Span: Fair  Recall:  [employer] of Knowledge: Fair  Language: Fair  Akathisia:  Yes  Handed:  Right  AIMS (if indicated): UTA  Assets:  Communication Skills Desire for Improvement Housing Social Support  ADL's:  Intact  Cognition: WNL  Sleep:  Fair   Screenings: PHQ2-9   Flowsheet Row Office Visit from 06/03/2020 in [facility] Visit from 08/27/2019 in  [facility] Visit from 08/21/2019 in [facility] Office Visit from 08/09/2018 in [facility] Visit from 03/02/2018 in [facility] Total Score 3 1 2  0 0  PHQ-9 Total Score 7 8 7  0 -       Assessment and Plan: Lindsay Nicholson is a 35 year old Asian female, employed, lives in [place], has a history of bipolar disorder, panic attacks, skin picking disorder was evaluated by telemedicine today.  Patient is biologically predisposed given her history of trauma.  Patient with psychosocial stressors of relationship struggles with boyfriend, financial problems, current pandemic, possible adverse side effects to medication.  She will benefit from the following plan.  Plan Bipolar disorder-improving Reduce Latuda to 20 mg p.o. daily with supper.  Dosage reduced due to possible side effects of akathisia. Continue Lamictal 150 mg p.o. daily in divided dosage Venlafaxine extended release 37.5 mg p.o. daily-reduced dosage  Panic attacks-improving Propranolol 20 mg p.o. 3 times daily as needed Patient advised to continue psychotherapy sessions with Mr. 31.   Skin picking disorder-improving Patient to continue CBT She has been noncompliant.  Encouraged compliance. We will coordinate With Mr. Lindsay Nicholson.  Insomnia-improving Mirtazapine 7.5-15 mg p.o. nightly as needed Pending sleep study.  Tobacco use disorder-unstable Patient has been unable to start Chantix as prescribed. Provided counseling.  She will get in touch with the pharmacy and let writer know.  Akathisia-unstable Encouraged patient to take propranolol 20 mg p.o. 3 times daily as needed Reduce Latuda to 20 mg p.o. daily with supper.  At risk for prolonged QT syndrome-pending EKG.  Follow-up in clinic in 2 to 3 weeks or sooner if needed.   I have spent atleast 30 minutes face to face by video with patient  today. More than 50 % of the time was spent for preparing to see the patient ( e.g., review of test, records ), obtaining and to review and separately obtained history , ordering medications and test ,psychoeducation and supportive psychotherapy and care coordination,as well as documenting clinical information in electronic health record. This  note was generated in part or whole with voice recognition software. Voice recognition is usually quite accurate but there are transcription errors that can and very often do occur. I apologize for any typographical errors that were not detected and corrected.        Jomarie Longs, MD 10/09/2020, 8:24 AM

## 2020-10-16 ENCOUNTER — Other Ambulatory Visit: Payer: Self-pay | Admitting: Physical Medicine and Rehabilitation

## 2020-10-16 DIAGNOSIS — M25512 Pain in left shoulder: Secondary | ICD-10-CM

## 2020-10-16 DIAGNOSIS — G8929 Other chronic pain: Secondary | ICD-10-CM

## 2020-10-27 ENCOUNTER — Other Ambulatory Visit: Payer: Self-pay

## 2020-10-27 ENCOUNTER — Telehealth (INDEPENDENT_AMBULATORY_CARE_PROVIDER_SITE_OTHER): Payer: 59 | Admitting: Psychiatry

## 2020-10-27 DIAGNOSIS — F3161 Bipolar disorder, current episode mixed, mild: Secondary | ICD-10-CM

## 2020-10-27 NOTE — Progress Notes (Signed)
Patient was in a car dealership when Clinical research associate connected by video.  When patient asked if this is going to be a private appointment she reported she was not in a private space.  Hence advised patient to reschedule the appointment.

## 2021-01-03 ENCOUNTER — Other Ambulatory Visit: Payer: Self-pay | Admitting: Psychiatry

## 2021-01-03 DIAGNOSIS — F3161 Bipolar disorder, current episode mixed, mild: Secondary | ICD-10-CM

## 2021-01-06 ENCOUNTER — Encounter: Payer: Self-pay | Admitting: Psychiatry

## 2021-01-06 ENCOUNTER — Other Ambulatory Visit: Payer: Self-pay

## 2021-01-06 ENCOUNTER — Telehealth (INDEPENDENT_AMBULATORY_CARE_PROVIDER_SITE_OTHER): Payer: 59 | Admitting: Psychiatry

## 2021-01-06 ENCOUNTER — Telehealth: Payer: Self-pay

## 2021-01-06 DIAGNOSIS — F410 Panic disorder [episodic paroxysmal anxiety] without agoraphobia: Secondary | ICD-10-CM

## 2021-01-06 DIAGNOSIS — F17200 Nicotine dependence, unspecified, uncomplicated: Secondary | ICD-10-CM

## 2021-01-06 DIAGNOSIS — Z9111 Patient's noncompliance with dietary regimen: Secondary | ICD-10-CM

## 2021-01-06 DIAGNOSIS — F424 Excoriation (skin-picking) disorder: Secondary | ICD-10-CM

## 2021-01-06 DIAGNOSIS — F3161 Bipolar disorder, current episode mixed, mild: Secondary | ICD-10-CM | POA: Diagnosis not present

## 2021-01-06 DIAGNOSIS — Z91199 Patient's noncompliance with other medical treatment and regimen due to unspecified reason: Secondary | ICD-10-CM

## 2021-01-06 DIAGNOSIS — Z79899 Other long term (current) drug therapy: Secondary | ICD-10-CM | POA: Insufficient documentation

## 2021-01-06 DIAGNOSIS — Z9189 Other specified personal risk factors, not elsewhere classified: Secondary | ICD-10-CM

## 2021-01-06 NOTE — Progress Notes (Addendum)
Virtual Visit via Video Note  I connected with Lindsay Nicholson on 01/06/21 at 11:00 AM EDT by a video enabled telemedicine application and verified that I am speaking with the correct person using two identifiers.  Location Provider Location : [facility] Patient Location : Work  Participants: Patient , Provider   I discussed the limitations of evaluation and management by telemedicine and the availability of in person appointments. The patient expressed understanding and agreed to proceed.   I discussed the assessment and treatment plan with the patient. The patient was provided an opportunity to ask questions and all were answered. The patient agreed with the plan and demonstrated an understanding of the instructions.   The patient was advised to call back or seek an in-person evaluation if the symptoms worsen or if the condition fails to improve as anticipated.   BH MD OP Progress Note  01/06/2021 6:16 PM Lindsay Nicholson  MRN:  [id number]  Chief Complaint:  Chief Complaint    Follow-up; Anxiety; Depression     HPI: Lindsay Nicholson is a 35 year old Asian female, lives in [place], employed, has a history of bipolar disorder, panic attacks, skin picking disorder, tobacco use disorder was evaluated by telemedicine today.  Patient has been noncompliant with follow-up recommendation as discussed on 10/08/2020.  Patient missed another appointment on 10/27/2020 since she was in a public setting at the time of appointment and could not complete it.  Patient did not call the clinic for making follow-up appointments hence staff here contacted patient and this appointment was made following that.  Patient today apologized for not scheduling appointment when writer discussed concern about the same.  She reported that she had a lot going on in her family.  Her grandmother passed away.  This may have happened in February.  She reports her dad had COVID and her brother had other medical problems.  She reports she also  has been noncompliant with psychotherapy sessions and could not elaborate on why.  She claims she is taking her medications as prescribed  Patient reports she does not feel good with regards to her mood symptoms.  She reports she feels irritable often.  She feels the propranolol is not working for her panic attacks.  She reports her symptoms as inability to breathe and crying spell which can last for few hours.  She reports she gets it twice a week.  She reports it can come out of blue or it can be triggered by situational stressors.  Patient reports she copes with it by laying down and closing her eyes and tries to get through it.  Patient reports appetite as low.  She reports she has been eating only 1 meal per day and only eat portions of it.  She may be eating poorly because of her appetite.  She does not know if she has lost weight or not.  Patient denies any suicidality, homicidality or perceptual disturbances.  Patient denies any other concerns today.  Visit Diagnosis:    ICD-10-CM   1. Bipolar 1 disorder, mixed, mild (HCC)  F31.61   2. Panic attacks  F41.0   3. Skin-picking disorder  F42.4   4. Tobacco use disorder  F17.200   5. At risk for prolonged QT interval syndrome  Z91.89 EKG 12-Lead  6. High risk medication use  Z79.899 Compliance Drug Analysis, Ur    Gamma GT    TSH  7. Noncompliance with treatment plan  Z91.11     Past Psychiatric History: I have  reviewed past psychiatric history from progress note on 03/15/2019.  Past trials of Wellbutrin, BuSpar, hydroxyzine, Effexor  Past Medical History:  Past Medical History:  Diagnosis Date  . Anxiety   . Cervicalgia   . SVD (spontaneous vaginal delivery) 09/14/2011    Past Surgical History:  Procedure Laterality Date  . LAPAROSCOPIC APPENDECTOMY N/A 11/16/2015   Procedure: APPENDECTOMY LAPAROSCOPIC;  Surgeon: Ovidio Kin, MD;  Location: [facility];  Service: General;  Laterality: N/A;  . SHOULDER FUSION SURGERY      Family  Psychiatric History: I have reviewed family psychiatric history from progress note on 03/15/2019  Family History:  Family History  Problem Relation Age of Onset  . Diabetes Father   . Hypertension Father   . High Cholesterol Father   . Anesthesia problems Neg Hx   . Cancer Neg Hx   . Heart disease Neg Hx   . Stroke Neg Hx   . Migraines Neg Hx     Social History: I have reviewed social history from my progress note on 03/15/2019 Social History   Socioeconomic History  . Marital status: Single    Spouse name: Not on file  . Number of children: 3  . Years of education: Not on file  . Highest education level: Associate degree: occupational, Scientist, product/process development, or vocational program  Occupational History  . Not on file  Tobacco Use  . Smoking status: Current Every Day Smoker    Packs/day: 0.25    Years: 15.00    Pack years: 3.75    Types: Cigarettes  . Smokeless tobacco: Never Used  Vaping Use  . Vaping Use: Never used  Substance and Sexual Activity  . Alcohol use: Yes    Alcohol/week: 0.0 standard drinks    Comment: occasional  . Drug use: No  . Sexual activity: Yes    Partners: Male    Birth control/protection: Injection, Implant  Other Topics Concern  . Not on file  Social History Narrative   Lives at home with her fiance and her children   Left handed   Caffeine: all day long   Social Determinants of Health   Financial Resource Strain: Not on file  Food Insecurity: Not on file  Transportation Needs: Not on file  Physical Activity: Not on file  Stress: Not on file  Social Connections: Not on file    Allergies: No Known Allergies  Metabolic Disorder Labs: No results found for: HGBA1C, MPG No results found for: PROLACTIN Lab Results  Component Value Date   CHOL 176 08/27/2019   TRIG 197.0 (H) 08/27/2019   HDL 38.40 (L) 08/27/2019   CHOLHDL 5 08/27/2019   VLDL 39.4 08/27/2019   LDLCALC 98 08/27/2019   LDLCALC 97 08/08/2017   Lab Results  Component Value Date    TSH 0.93 02/06/2019   TSH 1.02 12/03/2014    Therapeutic Level Labs: No results found for: LITHIUM No results found for: VALPROATE No components found for:  CBMZ  Current Medications: Current Outpatient Medications  Medication Sig Dispense Refill  . lamoTRIgine (LAMICTAL) 150 MG tablet Take 1 tablet (150 mg total) by mouth daily. 90 tablet 0  . lurasidone (LATUDA) 40 MG TABS tablet Take 0.5 tablets (20 mg total) by mouth daily with supper. 30 tablet 1  . mirtazapine (REMERON) 15 MG tablet Take 0.5-1 tablets (7.5-15 mg total) by mouth at bedtime as needed. 30 tablet 2  . propranolol (INDERAL) 20 MG tablet Take 1 tablet (20 mg total) by mouth 3 (three) times daily  as needed. For severe anxiety attacks 90 tablet 1  . rizatriptan (MAXALT-MLT) 10 MG disintegrating tablet Take 1 tablet (10 mg total) by mouth as needed for migraine. May repeat in 2 hours if needed 9 tablet 11  . topiramate (TOPAMAX) 100 MG tablet Take 1 tablet by mouth once daily 30 tablet 1  . varenicline (CHANTIX STARTING MONTH PAK) 0.5 MG X 11 & 1 MG X 42 tablet Take one 0.5 mg tablet by mouth once daily for 3 days, then increase to one 0.5 mg tablet twice daily for 4 days, then increase to one 1 mg tablet twice daily. 53 tablet 0  . venlafaxine XR (EFFEXOR-XR) 37.5 MG 24 hr capsule TAKE 1 CAPSULE BY MOUTH WITH BREAKFAST --DOSE  REDUCTION  BEING  TAPERED  OFF 30 capsule 0   No current facility-administered medications for this visit.     Musculoskeletal: Strength & Muscle Tone: UTA Gait & Station: UTA Patient leans: N/A  Psychiatric Specialty Exam: Review of Systems  Psychiatric/Behavioral: Positive for sleep disturbance. The patient is nervous/anxious.        Irritable  All other systems reviewed and are negative.   There were no vitals taken for this visit.There is no height or weight on file to calculate BMI.  General Appearance: Casual  Eye Contact:  Fair  Speech:  Clear and Coherent  Volume:  Normal   Mood:  Anxious and Irritable  Affect:  Congruent  Thought Process:  Goal Directed and Descriptions of Associations: Intact  Orientation:  Full (Time, Place, and Person)  Thought Content: Logical   Suicidal Thoughts:  No  Homicidal Thoughts:  No  Memory:  Immediate;   Fair Recent;   Fair Remote;   Fair  Judgement:  Fair  Insight:  Fair  Psychomotor Activity:  Normal  Concentration:  Concentration: Fair and Attention Span: Fair  Recall:  Lindsay Nicholson  Fund of Knowledge: Fair  Language: Fair  Akathisia:  No  Handed:  Left  AIMS (if indicated): UTA  Assets:  Communication Skills Desire for Improvement Housing Social Support  ADL's:  Intact  Cognition: WNL  Sleep:  Poor   Screenings: PHQ2-9   Flowsheet Row Video [facility] from [facility]06[facility]2021 [facility] Visit [facility]  PHQ-2 Total Score 4 3 1 2  0  PHQ-9 Total Score 13 7 8 7  0       Assessment and Plan: Lindsay Nicholson is a 35 year old Asian female, employed, lives in [place], has a history of bipolar disorder, panic attacks, skin picking disorder was evaluated by telemedicine today.  Patient is biologically predisposed given history of trauma.  Patient with psychosocial stressors of relationship struggles, financial problems, current pandemic, death in her family, work-related stressors, family members with medical problems.  Patient has been noncompliant with medications as well as psychotherapy sessions.  Patient will benefit from the following plan.  Plan Bipolar disorder-unstable Continue Latuda at reduced dose to 20 mg p.o. daily with supper Lamictal 150 mg p.o. daily in divided dosage Venlafaxine extended release 37.5 mg p.o. daily-reduced  dosage  Panic attacks-unstable Propranolol 20 mg p.o. 3 times daily as needed Continue psychotherapy sessions with Mr. Mardene Sayererry Carter-patient has been noncompliant  Skin picking disorder-unstable Continue CBT   Tobacco use disorder-unstable We will monitor  Insomnia-unstable Mirtazapine 7.5-15 mg p.o. nightly as needed Patient was referred for sleep study in the past- pending  At risk for prolonged QT syndrome-we will order EKG again.  She has been noncompliant  High risk medication use-Will order TSH, gamma GT, compliance drug analysis.  Discussed with patient the importance of compliance since she has a history of noncompliance with treatment in general.  Provided education again about clinic policy.  Advised patient to get the EKG within a week and the following labs within the next 3 days.  Patient was advised to ask questions and she denied any questions.  She also agreed to get the labs done and reported that she will be compliant.  Noncompliance with treatment- patient has been noncompliant with treatment plan as noted above.  Discussed compliance, provided education.  Follow-up in clinic in 2 to 3 weeks or sooner if needed.  This note was generated in part or whole with voice recognition software. Voice recognition is usually quite accurate but there are transcription errors that can and very often do occur. I apologize for any typographical errors that were not detected and corrected.        Jomarie Longs, MD 01/07/2021, 6:22 PM

## 2021-01-06 NOTE — Telephone Encounter (Signed)
faxed and confirmed order for ekg sent to the [facility]

## 2021-01-06 NOTE — Telephone Encounter (Signed)
called pt . left message that she will need to contact the [facility] registration desk at [phone] to set up appt for her EKG and Labwork to be done.  she was advised to get these tests completed on or before 3 days from today. or risk being dismissed for being non-compliant.

## 2021-01-06 NOTE — Telephone Encounter (Signed)
faxed and confirmed lab work order sent to [facility]. compliance drug alanlysis, Gamma GT, TSH dx: z79.899

## 2021-01-07 ENCOUNTER — Other Ambulatory Visit: Payer: Self-pay | Admitting: Psychiatry

## 2021-01-07 DIAGNOSIS — F3161 Bipolar disorder, current episode mixed, mild: Secondary | ICD-10-CM

## 2021-01-19 ENCOUNTER — Other Ambulatory Visit: Payer: Self-pay

## 2021-01-19 ENCOUNTER — Telehealth: Payer: 59 | Admitting: Psychiatry

## 2021-05-08 ENCOUNTER — Emergency Department (HOSPITAL_COMMUNITY)
Admission: EM | Admit: 2021-05-08 | Discharge: 2021-05-08 | Disposition: A | Discharge status: Home / Self Care | Payer: 59 | Attending: Emergency Medicine | Admitting: Emergency Medicine

## 2021-05-08 ENCOUNTER — Encounter (HOSPITAL_COMMUNITY): Payer: Self-pay

## 2021-05-08 ENCOUNTER — Other Ambulatory Visit: Payer: Self-pay

## 2021-05-08 DIAGNOSIS — S299XXA Unspecified injury of thorax, initial encounter: Secondary | ICD-10-CM | POA: Diagnosis present

## 2021-05-08 DIAGNOSIS — L0390 Cellulitis, unspecified: Secondary | ICD-10-CM

## 2021-05-08 DIAGNOSIS — Z23 Encounter for immunization: Secondary | ICD-10-CM | POA: Diagnosis not present

## 2021-05-08 DIAGNOSIS — F17210 Nicotine dependence, cigarettes, uncomplicated: Secondary | ICD-10-CM | POA: Insufficient documentation

## 2021-05-08 DIAGNOSIS — W57XXXA Bitten or stung by nonvenomous insect and other nonvenomous arthropods, initial encounter: Secondary | ICD-10-CM | POA: Insufficient documentation

## 2021-05-08 DIAGNOSIS — S20462A Insect bite (nonvenomous) of left back wall of thorax, initial encounter: Secondary | ICD-10-CM | POA: Insufficient documentation

## 2021-05-08 MED ORDER — TETANUS-DIPHTHERIA TOXOIDS TD 5-2 LFU IM INJ
0.5000 mL | INJECTION | Freq: Once | INTRAMUSCULAR | Status: AC
  Administered 2021-05-08: 0.5 mL via INTRAMUSCULAR
  Filled 2021-05-08: qty 0.5

## 2021-05-08 MED ORDER — HYDROCODONE-ACETAMINOPHEN 5-325 MG PO TABS: 1.0000 | ORAL_TABLET | ORAL | 0 refills | Status: AC | PRN

## 2021-05-08 MED ORDER — DOXYCYCLINE HYCLATE 100 MG PO CAPS: 100.0000 mg | ORAL_CAPSULE | Freq: Two times a day (BID) | ORAL | 0 refills | Status: AC

## 2021-05-08 MED ORDER — HYDROCODONE-ACETAMINOPHEN 5-325 MG PO TABS: 2.0000 | ORAL_TABLET | ORAL | 0 refills | Status: DC | PRN

## 2021-05-08 NOTE — ED Triage Notes (Signed)
Pt presents with a red swollen lump to her back. Pt believes she was bit by a bug a week ago and the lump has become worse since then.

## 2021-05-08 NOTE — ED Provider Notes (Signed)
[facility]-EMERGENCY DEPT Provider Note   CSN: [id number] Arrival date & time: 05/08/21  0957     History Chief Complaint  Patient presents with   Insect Bite    Lindsay Nicholson is a 35 y.o. female.  35 year old female presents with 1 week history of swelling to her left upper back.  Thinks that she may have been bitten by a insect.  Denies any fever or chills.  Area has been getting larger and has been red and indurated.  Denies any other lesions on her body.  No treatment use prior to arrival.  Nothing makes her symptoms better.      Past Medical History:  Diagnosis Date   Anxiety    Cervicalgia    SVD (spontaneous vaginal delivery) 09/14/2011    Patient Active Problem List   Diagnosis Date Noted   High risk medication use 01/06/2021   Drug induced akathisia 10/08/2020   At risk for prolonged QT interval syndrome 05/09/2020   Noncompliance with treatment plan 05/09/2020   Bipolar 1 disorder, mixed, mild (HCC) 10/05/2019   Insomnia due to mental disorder 08/20/2019   Myofascial pain syndrome, cervical 03/19/2019   Bipolar 2 disorder (HCC) 03/15/2019   Panic attacks 03/15/2019   Skin-picking disorder 03/15/2019   Tobacco use disorder 03/15/2019   GERD (gastroesophageal reflux disease) 08/09/2018   Chronic shoulder pain 08/09/2018   Anxiety state 12/03/2014   Frequent headaches 12/03/2014    Past Surgical History:  Procedure Laterality Date   LAPAROSCOPIC APPENDECTOMY N/A 11/16/2015   Procedure: APPENDECTOMY LAPAROSCOPIC;  Surgeon: Ovidio Kin, MD;  Location: [facility];  Service: General;  Laterality: N/A;   SHOULDER FUSION SURGERY       OB History     Gravida  3   Para  3   Term  3   Preterm      AB      Living  3      SAB      IAB      Ectopic      Multiple      Live Births  3           Family History  Problem Relation Age of Onset   Diabetes Father    Hypertension Father    High Cholesterol Father    Anesthesia  problems Neg Hx    Cancer Neg Hx    Heart disease Neg Hx    Stroke Neg Hx    Migraines Neg Hx     Social History   Tobacco Use   Smoking status: Every Day    Packs/day: 0.25    Years: 15.00    Pack years: 3.75    Types: Cigarettes   Smokeless tobacco: Never  Vaping Use   Vaping Use: Never used  Substance Use Topics   Alcohol use: Yes    Alcohol/week: 0.0 standard drinks    Comment: occasional   Drug use: No    Home Medications Prior to Admission medications   Medication Sig Start Date End Date Taking? Authorizing Provider  lamoTRIgine (LAMICTAL) 150 MG tablet Take 1 tablet (150 mg total) by mouth daily. 10/08/20   Jomarie Longs, MD  lurasidone (LATUDA) 40 MG TABS tablet Take 0.5 tablets (20 mg total) by mouth daily with supper. 10/08/20   Jomarie Longs, MD  mirtazapine (REMERON) 15 MG tablet Take 0.5-1 tablets (7.5-15 mg total) by mouth at bedtime as needed. 09/03/20   Jomarie Longs, MD  propranolol (INDERAL) 20 MG tablet  Take 1 tablet (20 mg total) by mouth 3 (three) times daily as needed. For severe anxiety attacks 05/09/20   Jomarie Longs, MD  rizatriptan (MAXALT-MLT) 10 MG disintegrating tablet Take 1 tablet (10 mg total) by mouth as needed for migraine. May repeat in 2 hours if needed 12/11/18   Anson Fret, MD  topiramate (TOPAMAX) 100 MG tablet Take 1 tablet by mouth once daily 03/19/20   Lorre Munroe, NP  varenicline (CHANTIX STARTING MONTH PAK) 0.5 MG X 11 & 1 MG X 42 tablet Take one 0.5 mg tablet by mouth once daily for 3 days, then increase to one 0.5 mg tablet twice daily for 4 days, then increase to one 1 mg tablet twice daily. 09/03/20   Jomarie Longs, MD  venlafaxine XR (EFFEXOR-XR) 37.5 MG 24 hr capsule TAKE 1 CAPSULE BY MOUTH WITH BREAKFAST --DOSE  REDUCTION  BEING  TAPERED  OFF 01/07/21   Jomarie Longs, MD    Allergies    Patient has no known allergies.  Review of Systems   Review of Systems  All other systems reviewed and are  negative.  Physical Exam Updated Vital Signs BP 122/88 (BP Location: Left Arm)   Pulse 95   Temp 98.6 F (37 C) (Oral)   Resp 14   Ht 1.626 m (5\' 4" )   Wt 59 kg   SpO2 100%   BMI 22.31 kg/m   Physical Exam Vitals and nursing note reviewed.  Constitutional:      General: She is not in acute distress.    Appearance: Normal appearance. She is well-developed. She is not toxic-appearing.  HENT:     Head: Normocephalic and atraumatic.  Eyes:     General: Lids are normal.     Conjunctiva/sclera: Conjunctivae normal.     Pupils: Pupils are equal, round, and reactive to light.  Neck:     Thyroid: No thyroid mass.     Trachea: No tracheal deviation.  Cardiovascular:     Rate and Rhythm: Normal rate and regular rhythm.     Heart sounds: Normal heart sounds. No murmur heard.   No gallop.  Pulmonary:     Effort: Pulmonary effort is normal. No respiratory distress.     Breath sounds: Normal breath sounds. No stridor. No decreased breath sounds, wheezing, rhonchi or rales.  Abdominal:     General: There is no distension.     Palpations: Abdomen is soft.     Tenderness: There is no abdominal tenderness. There is no rebound.  Musculoskeletal:        General: No tenderness. Normal range of motion.     Cervical back: Normal range of motion and neck supple.       Back:  Skin:    General: Skin is warm and dry.     Findings: Lesion present.  Neurological:     Mental Status: She is alert and oriented to person, place, and time. Mental status is at baseline.     GCS: GCS eye subscore is 4. GCS verbal subscore is 5. GCS motor subscore is 6.     Cranial Nerves: Cranial nerves are intact. No cranial nerve deficit.     Sensory: No sensory deficit.     Motor: Motor function is intact.  Psychiatric:        Attention and Perception: Attention normal.        Speech: Speech normal.        Behavior: Behavior normal.    ED Results /  Procedures / Treatments   Labs (all labs ordered are  listed, but only abnormal results are displayed) Labs Reviewed - No data to display  EKG None  Radiology No results found.  Procedures Procedures   Medications Ordered in ED Medications  tetanus & diphtheria toxoids (adult) (TENIVAC) injection 0.5 mL (has no administration in time range)    ED Course  I have reviewed the triage vital signs and the nursing notes.  Pertinent labs & imaging results that were available during my care of the patient were reviewed by me and considered in my medical decision making (see chart for details).    MDM Rules/Calculators/A&P                           Patient's tetanus status was updated here.  Patient with likely cellulitis.  Will start on doxycycline and give short course of opiate.  Return precautions given Final Clinical Impression(s) / ED Diagnoses Final diagnoses:  None    Rx / DC Orders ED Discharge Orders     None        Lorre Nick, MD 05/08/21 1018

## 2021-11-11 DIAGNOSIS — J019 Acute sinusitis, unspecified: Secondary | ICD-10-CM | POA: Diagnosis not present

## 2021-11-20 DIAGNOSIS — J0100 Acute maxillary sinusitis, unspecified: Secondary | ICD-10-CM | POA: Diagnosis not present

## 2021-11-20 DIAGNOSIS — R0982 Postnasal drip: Secondary | ICD-10-CM | POA: Diagnosis not present

## 2021-12-30 DIAGNOSIS — Z3046 Encounter for surveillance of implantable subdermal contraceptive: Secondary | ICD-10-CM | POA: Diagnosis not present

## 2021-12-30 DIAGNOSIS — Z30013 Encounter for initial prescription of injectable contraceptive: Secondary | ICD-10-CM | POA: Diagnosis not present

## 2022-01-01 DIAGNOSIS — Z3202 Encounter for pregnancy test, result negative: Secondary | ICD-10-CM | POA: Diagnosis not present

## 2022-01-01 DIAGNOSIS — Z3042 Encounter for surveillance of injectable contraceptive: Secondary | ICD-10-CM | POA: Diagnosis not present

## 2022-07-30 DIAGNOSIS — J00 Acute nasopharyngitis [common cold]: Secondary | ICD-10-CM | POA: Diagnosis not present

## 2023-10-21 DIAGNOSIS — Z30013 Encounter for initial prescription of injectable contraceptive: Secondary | ICD-10-CM | POA: Diagnosis not present

## 2024-03-23 DIAGNOSIS — N911 Secondary amenorrhea: Secondary | ICD-10-CM | POA: Diagnosis not present

## 2024-03-23 DIAGNOSIS — Z01411 Encounter for gynecological examination (general) (routine) with abnormal findings: Secondary | ICD-10-CM | POA: Diagnosis not present

## 2024-03-23 DIAGNOSIS — Z130 Encounter for screening for diseases of the blood and blood-forming organs and certain disorders involving the immune mechanism: Secondary | ICD-10-CM | POA: Diagnosis not present

## 2024-03-23 DIAGNOSIS — Z23 Encounter for immunization: Secondary | ICD-10-CM | POA: Diagnosis not present

## 2024-03-23 DIAGNOSIS — Z124 Encounter for screening for malignant neoplasm of cervix: Secondary | ICD-10-CM | POA: Diagnosis not present

## 2024-03-23 DIAGNOSIS — Z3202 Encounter for pregnancy test, result negative: Secondary | ICD-10-CM | POA: Diagnosis not present

## 2024-03-23 DIAGNOSIS — Z113 Encounter for screening for infections with a predominantly sexual mode of transmission: Secondary | ICD-10-CM | POA: Diagnosis not present

## 2024-03-23 DIAGNOSIS — Z30011 Encounter for initial prescription of contraceptive pills: Secondary | ICD-10-CM | POA: Diagnosis not present

## 2024-05-25 DIAGNOSIS — Z23 Encounter for immunization: Secondary | ICD-10-CM | POA: Diagnosis not present
